# Patient Record
Sex: Female | Born: 1970
Health system: Southern US, Community
[De-identification: ages and names within clinical notes are randomized; demographics above are authoritative.]

## PROBLEM LIST (undated history)

## (undated) DIAGNOSIS — I639 Cerebral infarction, unspecified: Secondary | ICD-10-CM

## (undated) DIAGNOSIS — D573 Sickle-cell trait: Secondary | ICD-10-CM

## (undated) DIAGNOSIS — I10 Essential (primary) hypertension: Secondary | ICD-10-CM

## (undated) DIAGNOSIS — T7840XA Allergy, unspecified, initial encounter: Secondary | ICD-10-CM

## (undated) HISTORY — DX: Allergy, unspecified, initial encounter: T78.40XA

## (undated) HISTORY — DX: Sickle-cell trait: D57.3

## (undated) HISTORY — PX: TUBAL LIGATION: SHX77

---

## 2014-04-14 ENCOUNTER — Emergency Department (HOSPITAL_COMMUNITY)
Admission: EM | Admit: 2014-04-14 | Discharge: 2014-04-14 | Disposition: A | Discharge status: Home / Self Care | Payer: Self-pay | Attending: Emergency Medicine | Admitting: Emergency Medicine

## 2014-04-14 ENCOUNTER — Encounter (HOSPITAL_COMMUNITY): Payer: Self-pay | Admitting: Emergency Medicine

## 2014-04-14 DIAGNOSIS — Z7982 Long term (current) use of aspirin: Secondary | ICD-10-CM | POA: Insufficient documentation

## 2014-04-14 DIAGNOSIS — M5489 Other dorsalgia: Secondary | ICD-10-CM

## 2014-04-14 DIAGNOSIS — I10 Essential (primary) hypertension: Secondary | ICD-10-CM | POA: Insufficient documentation

## 2014-04-14 DIAGNOSIS — M549 Dorsalgia, unspecified: Secondary | ICD-10-CM | POA: Insufficient documentation

## 2014-04-14 DIAGNOSIS — Z720 Tobacco use: Secondary | ICD-10-CM | POA: Insufficient documentation

## 2014-04-14 DIAGNOSIS — Z79899 Other long term (current) drug therapy: Secondary | ICD-10-CM | POA: Insufficient documentation

## 2014-04-14 HISTORY — DX: Essential (primary) hypertension: I10

## 2014-04-14 LAB — URINALYSIS, ROUTINE W REFLEX MICROSCOPIC
Bilirubin Urine: NEGATIVE
Glucose, UA: NEGATIVE mg/dL
Hgb urine dipstick: NEGATIVE
Ketones, ur: NEGATIVE mg/dL
Leukocytes, UA: NEGATIVE
NITRITE: NEGATIVE
PH: 6.5 (ref 5.0–8.0)
Protein, ur: NEGATIVE mg/dL
SPECIFIC GRAVITY, URINE: 1.01 (ref 1.005–1.030)
Urobilinogen, UA: 0.2 mg/dL (ref 0.0–1.0)

## 2014-04-14 MED ORDER — METHOCARBAMOL 500 MG PO TABS
750.0000 mg | ORAL_TABLET | Freq: Once | ORAL | Status: AC
  Administered 2014-04-14: 750 mg via ORAL
  Filled 2014-04-14: qty 2

## 2014-04-14 MED ORDER — HYDROCODONE-ACETAMINOPHEN 5-325 MG PO TABS
2.0000 | ORAL_TABLET | Freq: Once | ORAL | Status: AC
  Administered 2014-04-14: 2 via ORAL
  Filled 2014-04-14: qty 2

## 2014-04-14 MED ORDER — METHOCARBAMOL 1000 MG/10ML IJ SOLN: 1000.0000 mg | Freq: Once | INTRAMUSCULAR | Status: DC

## 2014-04-14 MED ORDER — METHOCARBAMOL 500 MG PO TABS: 500.0000 mg | ORAL_TABLET | Freq: Two times a day (BID) | ORAL | Status: DC
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
## 2014-04-14 NOTE — ED Provider Notes (Signed)
Medical screening examination/treatment/procedure(s) were performed by non-physician practitioner and as supervising physician I was immediately available for consultation/collaboration.   EKG Interpretation None        Orpah Greek, MD 04/14/14 [phone]
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
## 2014-04-14 NOTE — Discharge Instructions (Signed)
Return to the emergency room with worsening of symptoms, new symptoms or with symptoms that are concerning , especially fevers, loss of control of bladder or bowels, numbness or tingling around genital region or anus, weakness. RICE: Rest, Ice (three cycles of 20 mins on, 28mins off at least twice a day), compression/brace, elevation. Heating pad works well for back pain. Ibuprofen 400mg  (2 tablets 200mg ) every 5-6 hours for 3-5 days and then as needed for pain. Robaxin for severe pain. Do not operate machinery, drive or drink alcohol while taking narcotics or muscle relaxers. Follow up with PCP if symptoms worsen or are persistent. Since you are new to the area, follow information below to establish care. Follow up with PCP for your HTN. It was elevated today.   Please call your doctor for a followup appointment within 24-48 hours. When you talk to your doctor please let them know that you were seen in the emergency department and have them acquire all of your records so that they can discuss the findings with you and formulate a treatment plan to fully care for your new and ongoing problems. If you do not have a primary care provider please call the number below under ED resources to establish care with a provider and follow up.    Emergency Department Resource Guide 1) Find a Doctor and Pay Out of Pocket Although you won't have to find out who is covered by your insurance plan, it is a good idea to ask around and get recommendations. You will then need to call the office and see if the doctor you have chosen will accept you as a new patient and what types of options they offer for patients who are self-pay. Some doctors offer discounts or will set up payment plans for their patients who do not have insurance, but you will need to ask so you aren't surprised when you get to your appointment.  2) Contact Your Local Health Department Not all health departments have doctors that can see patients for  sick visits, but many do, so it is worth a call to see if yours does. If you don't know where your local health department is, you can check in your phone book. The CDC also has a tool to help you locate your state's health department, and many state websites also have listings of all of their local health departments.  3) Find a [facility] If your illness is not likely to be very severe or complicated, you may want to try a walk in clinic. These are popping up all over the country in pharmacies, drugstores, and shopping centers. They're usually staffed by nurse practitioners or physician assistants that have been trained to treat common illnesses and complaints. They're usually fairly quick and inexpensive. However, if you have serious medical issues or chronic medical problems, these are probably not your best option.  No Primary Care Doctor: - Call Health Connect at  [phone] - they can help you locate a primary care doctor that  accepts your insurance, provides certain services, etc. - Physician Referral Service- [phone]  Chronic Pain Problems: Organization         Address  Phone   Notes  [facility]  [phone] Patients need to be referred by their primary care doctor.   Medication Assistance: Organization         Address  Phone   Notes  [facility] Medication [facility] [place] [place], [place] [address] [phone] --Must be a resident of [facility] -- Must have NO insurance coverage whatsoever (no Medicaid/ Medicare, etc.) -- The pt. MUST have a primary care doctor that directs their care regularly and follows them in the community   MedAssist  [phone]   [employer]  [phone]    Agencies that provide inexpensive medical care: Organization         Address  Phone   Notes  [place]  [phone]   [facility]    [phone]   [facility]  [place], [place] [address] [phone]   [place] [address], [place] [phone]   Planned Parenthood    [phone]   [facility]    [phone]   [place] [address], [place] Phone:  [phone], Fax:  [phone] Hours of Operation:  9 am - 6 pm, M-F.  Also accepts Medicaid/Medicare and self-pay.  [facility] for [place], [address], [place] Phone: [phone], Fax: [phone]. Hours of Operation:  8:30 am - 5:30 pm, M-F.  Also accepts Medicaid and self-pay.  [facility] [place] [address] [place] Phone: [phone]   [place], [place], [place] [phone], Ext. [phone] Mondays & Thursdays: 7-9 AM.  First 15 patients are seen on a first come, first serve basis.    [place] Providers:  Organization         Address  Phone   Notes  [facility] [address], [place] [phone] Also accepts self-pay patients.  [facility] [address], [place]  [phone]   [place], [address], [place] [phone]   [facility] [address], [place] [phone]   Lucianne Lei [address], [place]   [phone] Only accepts [place] Access [place] patients after they have their name applied to their card.   Self-Pay (no insurance) in [facility]:  Organization         Address  Phone   Notes  Sickle Cell Patients, [facility] [place] [phone]   [facility] [place] [phone]   [facility] [place], [place], [place] [phone]   [facility]/Dr. Osei-Bonsu  [address] [place] or [address], [place] [phone] Phone number for both [place] and [place] locations is the same.  [facility] and [facility] [address], [place] [phone]   [facility] [address], [place] or [address] [phone]   [facility] [address] [place] [phone], phone; [phone], fax Sees patients 1st and 3rd Saturday of every month.  Must not qualify for public or private insurance (i.e. Medicaid, Medicare, [place] Health Choice, Veterans' Benefits)  Household income should be no more than 200% of the poverty level The clinic cannot treat you if you are pregnant or think you are pregnant  Sexually transmitted diseases are not treated at the clinic.    Dental Care: Organization  Address  Phone  Notes  [facility] [place], [place] [phone] Accepts children up to age 12 who are enrolled in [place] or [place]; pregnant women with a Medicaid card; and children who have applied for Medicaid or [place] Health Choice, but were declined, whose parents can pay a reduced fee at time of service.  [facility] Department of [facility]  [address], [place] [phone] Accepts children up to age 64 who are enrolled in [place] or [place]; pregnant women with a Medicaid card; and children who have applied for Medicaid or [place] Health Choice, but were declined, whose parents can pay a reduced fee at time of service.  [place] Adult Dental Access PROGRAM  [place] [phone] Patients are seen by appointment only. Walk-ins are not accepted. [place] will see patients 74 years of age and older. Monday - Tuesday (8am-5pm) Most Wednesdays (8:30-5pm) $30 per visit, cash only  [facility] Adult Dental Access PROGRAM  [address], [facility] [phone] Patients are seen by appointment only. Walk-ins are not accepted. [place] will see patients 76 years of age and older. One Wednesday Evening (Monthly: Volunteer Based).  $30 per visit, cash only  [place]  [phone] for adults; Children under age 21, call Graduate Pediatric Dentistry at [phone]. Children aged 70-14, please call [phone] to request a pediatric application.  Dental services are provided in all areas of dental care including fillings, crowns and bridges, complete and partial dentures, implants, gum treatment, root canals, and extractions. Preventive care is also provided. Treatment is provided to both adults and children. Patients are selected via a lottery and there is often a waiting list.   [facility] [address], [place]  [phone] [email or web address]   [facility] [address] [place], [place] [phone], Ext. [phone] Second and Fourth Thursday of each month, opens at 6:30 AM; Clinic ends at 9 AM.  Patients are seen on a first-come first-served basis, and a limited number are seen during each clinic.   [facility]  [address] [place], [place] [phone]   Eligibility Requirements You must have lived in [place], [place], or [place] counties for at least the last three months.   You cannot be eligible for state or federal sponsored [employer], including [employer], [place], or [employer].   You generally cannot be eligible for healthcare insurance through your employer.    How to apply: Eligibility screenings are held every Tuesday and Wednesday afternoon from 1:00 pm until 4:00 pm. You do not need an appointment for the interview!  [facility] [address], [place], [place]   [place]  [place] Department  [place]  [phone]    Behavioral Health Resources in the Community: Intensive Outpatient Programs Organization         Address  Phone  Notes  [place]. [address], [place], [place] [phone]   [facility] [address], [place], [place]   ADS: [facility] Drug [facility] [address] [place], [place]   [place] [address]  [place], [place] or [phone]   Substance Abuse  Resources Organization         Address  Phone  Notes  Alcohol and Drug Services  [place]  ([phone]   [facility] [place]  [phone]   Chinita Pester  [phone]   Residential & Outpatient Substance Abuse Program  9560572712   Psychological Services Organization         Address  Phone  Notes  [facility] [place]  [place]  [phone]   [place] [address] [place] or [phone]    Mobile Crisis Teams Organization         Address  Phone  Notes  [facility], Mobile Crisis Care Unit  [phone]   Assertive Psychotherapeutic Services  [address] [place], [place]   [place] Levels [address], [place] [phone]    Self-Help/Support Groups Organization         Address  Phone             Notes  [place]. of [place] - variety of support groups  [place] Call for more information  Narcotics Anonymous (NA), [facility] [address], [employer] [place]  2 meetings at this location   Special educational needs teacher         Address  Phone  Notes  [facility]idential [facility] [place],    [place]  [phone]   [facility]  [address], [place] [phone], [place], [place]   [place], [place]  [phone] Admissions: 8am-3pm M-F  Incentives Substance [place] [address],    [place], [place] [phone]   [facility] [address] [place], [place], [place]   [facility] [address]  [place], [place]   [facility] Programs - Intensive Outpatient [address], [place], [place]   [facility] ([place] [place].,  [place], [place] [phone] or [phone]   [facility] (RTS) [address], [place], [place] Accepts Medicaid  [facility] [place] [address]  [place] [phone] Substance Abuse/Addiction Treatment   [facility] Organization         Address  Phone  Notes  [facility]  [phone]   Domenic Schwab, PhD [address] [place]lis [place], [place]   [phone] or [phone]   [facility]ita [place], [place] [phone]   [facility] [address] [place], [place] [phone] Insurance/Medicaid/sponsorship through [facility] and Families [address], [address]                                    [place], [place] [phone] [place] [address], [place] [phone]    Dr. Adele Schilder  [phone]   [facility] Dept. 1) [address] [place] 2) [place] 3)  [address], [place] [phone]  [phone]   [place] [phone] or [phone] (After Hours)

## 2015-05-16 ENCOUNTER — Emergency Department (HOSPITAL_COMMUNITY): Payer: Self-pay

## 2015-05-16 ENCOUNTER — Encounter (HOSPITAL_COMMUNITY): Payer: Self-pay

## 2015-05-16 ENCOUNTER — Emergency Department (HOSPITAL_COMMUNITY)
Admission: EM | Admit: 2015-05-16 | Discharge: 2015-05-16 | Disposition: A | Discharge status: Home / Self Care | Payer: Self-pay | Attending: Emergency Medicine | Admitting: Emergency Medicine

## 2015-05-16 DIAGNOSIS — Z7982 Long term (current) use of aspirin: Secondary | ICD-10-CM | POA: Insufficient documentation

## 2015-05-16 DIAGNOSIS — R05 Cough: Secondary | ICD-10-CM | POA: Insufficient documentation

## 2015-05-16 DIAGNOSIS — R51 Headache: Secondary | ICD-10-CM | POA: Insufficient documentation

## 2015-05-16 DIAGNOSIS — Z79899 Other long term (current) drug therapy: Secondary | ICD-10-CM | POA: Insufficient documentation

## 2015-05-16 DIAGNOSIS — F17210 Nicotine dependence, cigarettes, uncomplicated: Secondary | ICD-10-CM | POA: Insufficient documentation

## 2015-05-16 DIAGNOSIS — R69 Illness, unspecified: Secondary | ICD-10-CM

## 2015-05-16 DIAGNOSIS — J029 Acute pharyngitis, unspecified: Secondary | ICD-10-CM | POA: Insufficient documentation

## 2015-05-16 DIAGNOSIS — M791 Myalgia: Secondary | ICD-10-CM | POA: Insufficient documentation

## 2015-05-16 DIAGNOSIS — I10 Essential (primary) hypertension: Secondary | ICD-10-CM | POA: Insufficient documentation

## 2015-05-16 DIAGNOSIS — J111 Influenza due to unidentified influenza virus with other respiratory manifestations: Secondary | ICD-10-CM

## 2015-05-16 NOTE — ED Provider Notes (Signed)
CSN: [id number]     Arrival date & time 05/16/15  1403 History   First MD Initiated Contact with Patient 05/16/15 1541     Chief Complaint  Patient presents with  . Generalized Body Aches  . Sore Throat  . Fever   Chief complaint "I have the flu"  (Consider location/radiation/quality/duration/timing/severity/associated sxs/prior Treatment) HPI Patient complains of diffuse myalgias, cough sore throat headache onset 2 days ago she denies fever denies nausea or vomiting he denies abdominal pain. She's treated herself with ginger with partial relief. She denies shortness of breath. No other associated symptoms. Reports several coworkers have similar symptoms. Nothing makes symptoms better or worse Past Medical History  Diagnosis Date  . Hypertension    History reviewed. No pertinent past surgical history. History reviewed. No pertinent family history. Social History  Substance Use Topics  . Smoking status: Current Every Day Smoker    Types: Cigarettes  . Smokeless tobacco: None  . Alcohol Use: No   smokes 2 cigarettes per day. Attempting to quit, no drug use OB History    No data available     Review of Systems  Constitutional: Negative.   HENT: Positive for sore throat.   Respiratory: Positive for cough.   Cardiovascular: Negative.   Gastrointestinal: Negative.   Musculoskeletal: Positive for myalgias.  Skin: Negative.   Neurological: Positive for headaches.  Psychiatric/Behavioral: Negative.   All other systems reviewed and are negative.     Allergies  Review of patient's allergies indicates no known allergies.  Home Medications   Prior to Admission medications   Medication Sig Start Date End Date Taking? Authorizing Provider  aspirin 81 MG chewable tablet Chew 81 mg by mouth daily.    Historical Provider, MD  ibuprofen (ADVIL,MOTRIN) 200 MG tablet Take 400-1,200 mg by mouth every 6 (six) hours as needed for moderate pain or cramping.    Historical Provider, MD   methocarbamol (ROBAXIN) 500 MG tablet Take 1 tablet (500 mg total) by mouth 2 (two) times daily. 04/14/14   Al Corpus, PA-C   BP 121/87 mmHg  Pulse 94  Temp(Src) 98.2 F (36.8 C) (Oral)  Resp 16  SpO2 100%  LMP 04/19/2015 Physical Exam  Constitutional: She appears well-developed and well-nourished.  HENT:  Head: Normocephalic and atraumatic.  Right Ear: External ear normal.  Left Ear: External ear normal.  Bilateral tympanic membranes normal oropharynx minimally reddened uvula midline  Eyes: Conjunctivae are normal. Pupils are equal, round, and reactive to light.  Neck: Neck supple. No tracheal deviation present. No thyromegaly present.  Cardiovascular: Normal rate and regular rhythm.   No murmur heard. Pulmonary/Chest: Effort normal and breath sounds normal.  Abdominal: Soft. Bowel sounds are normal. She exhibits no distension. There is no tenderness.  Musculoskeletal: Normal range of motion. She exhibits no edema or tenderness.  Neurological: She is alert. Coordination normal.  Skin: Skin is warm and dry. No rash noted.  Psychiatric: She has a normal mood and affect.  Nursing note and vitals reviewed.   ED Course  Procedures (including critical care time) Labs Review Labs Reviewed - No data to display  Imaging Review Dg Chest 2 View  05/16/2015  CLINICAL DATA:  Body aches, sore throat, chest pain, cough, and fever for 2 days. EXAM: CHEST  2 VIEW COMPARISON:  None. FINDINGS: Linear opacities in the left lower lobe favoring subsegmental atelectasis, less likely to be early bronchopneumonia. The lungs appear otherwise clear. Cardiac and mediastinal margins appear normal. IMPRESSION: 1. Faint linear opacities  in the left lower lobe, favoring subsegmental atelectasis, less likely to be early bronchopneumonia. Electronically Signed   By: Van Clines M.D.   On: 05/16/2015 14:58   I have personally reviewed and evaluated these images and lab results as part of my  medical decision-making.   EKG Interpretation   Date/Time:  Tuesday May 16 2015 14:37:17 EST Ventricular Rate:  97 PR Interval:  179 QRS Duration: 92 QT Interval:  362 QTC Calculation: 460 R Axis:   40 Text Interpretation:  Sinus rhythm No old tracing to compare Confirmed by  Winfred Leeds  MD, Michall Noffke [phone]) on 05/16/2015 3:43:36 PM     Chest x-ray viewed by me MDM  Doubt pneumonia. Patient with excellent-like illness. No fever. Normal pulse oximetry, and clear lung exam. Normal respiratory rate. I councilled pt for 5 minutes on smoking cerssation Plan Tylenol. Encourage oral hydration. Referral to [facility], [facility], [facility] wellness primary care physician. Diagnosis influenza-like illness Final diagnoses:  None        Orlie Dakin, MD 05/16/15 [phone]

## 2015-05-16 NOTE — Discharge Instructions (Signed)
Influenza, Adult Take Tylenol as directed for aches. Make sure that you continue to work on quitting smoking. See an urgent care center if you're not feeling back to normal 3 days. Call the [facility] to get a primary care physician. Return if you feel worse for any reason. Influenza (flu) is an infection in the mouth, nose, and throat (respiratory tract) caused by a virus. The flu can make you feel very ill. Influenza spreads easily from person to person (contagious).  HOME CARE   Only take medicines as told by your doctor.  Use a cool mist humidifier to make breathing easier.  Get plenty of rest until your fever goes away. This usually takes 3 to 4 days.  Drink enough fluids to keep your pee (urine) clear or pale yellow.  Cover your mouth and nose when you cough or sneeze.  Wash your hands well to avoid spreading the flu.  Stay home from work or school until your fever has been gone for at least 1 full day.  Get a flu shot every year. GET HELP RIGHT AWAY IF:   You have trouble breathing or feel short of breath.  Your skin or nails turn blue.  You have severe neck pain or stiffness.  You have a severe headache, facial pain, or earache.  Your fever gets worse or keeps coming back.  You feel sick to your stomach (nauseous), throw up (vomit), or have watery poop (diarrhea).  You have chest pain.  You have a deep cough that gets worse, or you cough up more thick spit (mucus). MAKE SURE YOU:   Understand these instructions.  Will watch your condition.  Will get help right away if you are not doing well or get worse.   This information is not intended to replace advice given to you by your health care provider. Make sure you discuss any questions you have with your health care provider.   Document Released: 03/26/2008 Document Revised: 07/08/2014 Document Reviewed: 09/16/2011 Elsevier Interactive Patient Education [employer].

## 2015-05-16 NOTE — ED Notes (Signed)
Pt c/o generalized body aches, sore throat, chest pain w/ coughing, and fever x 2 days.  Pain score 9/10.  Pt has not taken anything for symptoms.  Sts her coworkers have been sick.

## 2015-05-16 NOTE — ED Notes (Signed)
Patient was alert, oriented and stable upon discharge. RN went over AVS and patient had no further questions.  

## 2015-10-23 ENCOUNTER — Ambulatory Visit (INDEPENDENT_AMBULATORY_CARE_PROVIDER_SITE_OTHER): Payer: Self-pay | Admitting: Internal Medicine

## 2015-10-23 ENCOUNTER — Encounter: Payer: Self-pay | Admitting: Internal Medicine

## 2015-10-23 VITALS — BP 114/80 | HR 89 | Temp 98.3°F | Ht 60.0 in | Wt 197.6 lb

## 2015-10-23 DIAGNOSIS — Z0000 Encounter for general adult medical examination without abnormal findings: Secondary | ICD-10-CM | POA: Insufficient documentation

## 2015-10-23 DIAGNOSIS — I10 Essential (primary) hypertension: Secondary | ICD-10-CM

## 2015-10-23 DIAGNOSIS — J309 Allergic rhinitis, unspecified: Secondary | ICD-10-CM

## 2015-10-23 DIAGNOSIS — Z720 Tobacco use: Secondary | ICD-10-CM | POA: Insufficient documentation

## 2015-10-23 DIAGNOSIS — Z23 Encounter for immunization: Secondary | ICD-10-CM | POA: Insufficient documentation

## 2015-10-23 DIAGNOSIS — F17210 Nicotine dependence, cigarettes, uncomplicated: Secondary | ICD-10-CM

## 2015-10-23 DIAGNOSIS — M62830 Muscle spasm of back: Secondary | ICD-10-CM | POA: Insufficient documentation

## 2015-10-23 MED ORDER — ATENOLOL 50 MG PO TABS: 50.0000 mg | ORAL_TABLET | Freq: Every day | ORAL | Status: DC

## 2015-10-23 MED ORDER — CYCLOBENZAPRINE HCL 10 MG PO TABS
10.0000 mg | ORAL_TABLET | Freq: Every evening | ORAL | Status: DC | PRN
Start: 2015-10-23 — End: 2016-01-22

## 2015-10-23 MED ORDER — SALINE SPRAY 0.65 % NA SOLN: 1.0000 | NASAL | Status: DC | PRN

## 2015-10-23 NOTE — Assessment & Plan Note (Addendum)
Pt reports last pap smear was about 3.5 yrs ago.  Is unsure if she also had HPV completed. -will need pap smear  -check labs today

## 2015-10-23 NOTE — Assessment & Plan Note (Addendum)
BP 114/80.   -refilled atenolol  -check CMP, lipid panel

## 2015-10-23 NOTE — Assessment & Plan Note (Signed)
Pt reports sneezing, rhinorrhea, itchy eyes, nasal congestion.  No fever/chills.  She tried steroid spray and "dug a hole in her nose."  She takes zyrtec daily which helps somewhat.  Benadryl makes her sleepy.  She has not tried nasal saline spray. -will try nasal saline spray -cont zyrtec

## 2015-10-23 NOTE — Patient Instructions (Signed)
Thank you for your visit today.   Please return to the internal medicine clinic in about 3 months for a pap smear. I have refilled your medications and sent them to your pharmacy (atenolol and flexeril).    We will check some basic labs today.  You need a pap smear.     Please be sure to bring all of your medications with you to every visit; this includes herbal supplements, vitamins, eye drops, and any over-the-counter medications.   Should you have any questions regarding your medications and/or any new or worsening symptoms, please be sure to call the clinic at [phone].   If you believe that you are suffering from a life threatening condition or one that may result in the loss of limb or function, then you should call 911 and proceed to the nearest Emergency Department.   A healthy lifestyle and preventative care can promote health and wellness.   Maintain regular health, dental, and eye exams.  Eat a healthy diet. Foods like vegetables, fruits, whole grains, low-fat dairy products, and lean protein foods contain the nutrients you need without too many calories. Decrease your intake of foods high in solid fats, added sugars, and salt. Get information about a proper diet from your caregiver, if necessary.  Regular physical exercise is one of the most important things you can do for your health. Most adults should get at least 150 minutes of moderate-intensity exercise (any activity that increases your heart rate and causes you to sweat) each week. In addition, most adults need muscle-strengthening exercises on 2 or more days a week.   Maintain a healthy weight. The body mass index (BMI) is a screening tool to identify possible weight problems. It provides an estimate of body fat based on height and weight. Your caregiver can help determine your BMI, and can help you achieve or maintain a healthy weight. For adults 20 years and older:  A BMI below 18.5 is considered  underweight.  A BMI of 18.5 to 24.9 is normal.  A BMI of 25 to 29.9 is considered overweight.  A BMI of 30 and above is considered obese.

## 2015-10-23 NOTE — Assessment & Plan Note (Addendum)
Pt has h/o lower back spasms since 2010.  She takes ibuprofen PRN and also flexeril PRN.  Denies any weakness or paresthesias in the LE.   -cont to monitor  -refilled flexeril 10mg  PRN hs

## 2015-10-23 NOTE — Progress Notes (Signed)
Patient ID: Amber Franco, female   DOB: 04-16-1971, 45 y.o.MRN: [id number]     Subjective:   Patient ID: Amber Franco female    DOB: 1970/08/22 45 y.o.    MRN: [id number] Health Maintenance Due: Health Maintenance Due  Topic Date Due  . HIV Screening  02/11/1986  . TETANUS/TDAP  02/11/1990  . PAP SMEAR  02/12/1992    _________________________________________________  HPI: Amber Franco is a 45 y.o. female here for to establish care.  Pt has a PMH outlined below.  Please see problem-based charting assessment and plan for further status of patient's chronic medical problems addressed at today's visit.  PMH: Past Medical History  Diagnosis Date  . Hypertension   . Allergy   . Sickle cell trait ([place])     Medications: Current Outpatient Prescriptions on File Prior to Visit  Medication Sig Dispense Refill  . aspirin 81 MG chewable tablet Chew 81 mg by mouth daily.    Marland Kitchen ibuprofen (ADVIL,MOTRIN) 200 MG tablet Take 400-1,200 mg by mouth every 6 (six) hours as needed for moderate pain or cramping.     No current facility-administered medications on file prior to visit.    Allergies: Allergies  Allergen Reactions  . Hydrocodone Anaphylaxis    FH: Family History  Problem Relation Age of Onset  . Cancer Mother   . Hypertension Father   . Stroke Father     SH: Social History   Social History  . Marital Status: Single    Spouse Name: N/A  . Number of Children: N/A  . Years of Education: N/A   Social History Main Topics  . Smoking status: Current Every Day Smoker    Types: Cigarettes  . Smokeless tobacco: None  . Alcohol Use: No  . Drug Use: No  . Sexual Activity: Not Asked   Other Topics Concern  . None   Social History Narrative    Review of Systems: Constitutional: Negative for fever, chills.  Respiratory: Negative for cough and shortness of breath.  Cardiovascular: Negative for chest pain.  Gastrointestinal: Negative for nausea, vomiting. Neurological:  Negative for dizziness.   Objective:   Vital Signs: Filed Vitals:   10/23/15 1440  BP: 114/80  Pulse: 89  Temp: 98.3 F (36.8 C)  TempSrc: Oral  Height: 5' (1.524 m)  Weight: 197 lb 9.6 oz (89.631 kg)  SpO2: 100%      BP Readings from Last 3 Encounters:  10/23/15 114/80  05/16/15 121/87  04/14/14 154/104    Physical Exam: Constitutional: Vital signs reviewed.  Patient is in NAD and cooperative with exam.  Head: Normocephalic and atraumatic. Eyes: EOMI, conjunctivae nl, no scleral icterus.  Neck: Supple. Cardiovascular: RRR, no MRG. Pulmonary/Chest: normal effort, CTAB, no wheezes, rales, or rhonchi. Abdominal: Soft. NT/ND +BS. Neurological: A&O x3, cranial nerves II-XII are grossly intact, moving all extremities. Extremities:  Skin: Warm, dry and intact. No rash.   Assessment & Plan:   Assessment and plan was discussed and formulated with my attending.

## 2015-10-23 NOTE — Assessment & Plan Note (Signed)
Pt smokes 3 cig/day for the past 20+ yrs.  She has tried the [place]. but states they don't work.  -given info on the quitline

## 2015-10-24 LAB — CMP14 + ANION GAP
ALBUMIN: 4.2 g/dL (ref 3.5–5.5)
ALK PHOS: 58 IU/L (ref 39–117)
ALT: 21 IU/L (ref 0–32)
ANION GAP: 18 mmol/L (ref 10.0–18.0)
AST: 13 IU/L (ref 0–40)
Albumin/Globulin Ratio: 1.6 (ref 1.2–2.2)
BUN / CREAT RATIO: 13 (ref 9–23)
BUN: 9 mg/dL (ref 6–24)
Bilirubin Total: <0.2 mg/dL (ref 0.0–1.2)
CALCIUM: 9.6 mg/dL (ref 8.7–10.2)
CO2: 19 mmol/L (ref 18–29)
Chloride: 103 mmol/L (ref 96–106)
Creatinine, Ser: 0.68 mg/dL (ref 0.57–1.00)
GFR calc Af Amer: 123 mL/min/{1.73_m2} (ref >59)
GFR calc non Af Amer: 107 mL/min/{1.73_m2} (ref >59)
Globulin, Total: 2.7 g/dL (ref 1.5–4.5)
Glucose: 101 mg/dL — ABNORMAL HIGH (ref 65–99)
Potassium: 4.2 mmol/L (ref 3.5–5.2)
Sodium: 140 mmol/L (ref 134–144)
Total Protein: 6.9 g/dL (ref 6.0–8.5)

## 2015-10-24 LAB — CBC WITH DIFFERENTIAL/PLATELET
BASOS ABS: 0 10*3/uL (ref 0.0–0.2)
BASOS: 0 %
EOS (ABSOLUTE): 0.4 10*3/uL (ref 0.0–0.4)
Eos: 3 %
Hematocrit: 41.5 % (ref 34.0–46.6)
Hemoglobin: 14.2 g/dL (ref 11.1–15.9)
IMMATURE GRANULOCYTES: 0 %
Immature Grans (Abs): 0 10*3/uL (ref 0.0–0.1)
LYMPHS ABS: 3.8 10*3/uL — AB (ref 0.7–3.1)
Lymphs: 31 %
MCH: 29.6 pg (ref 26.6–33.0)
MCHC: 34.2 g/dL (ref 31.5–35.7)
MCV: 87 fL (ref 79–97)
MONOS ABS: 0.7 10*3/uL (ref 0.1–0.9)
Monocytes: 6 %
NEUTROS ABS: 7.2 10*3/uL — AB (ref 1.4–7.0)
Neutrophils: 60 %
PLATELETS: 344 10*3/uL (ref 150–379)
RBC: 4.79 x10E6/uL (ref 3.77–5.28)
RDW: 13 % (ref 12.3–15.4)
WBC: 12 10*3/uL — ABNORMAL HIGH (ref 3.4–10.8)

## 2015-10-24 LAB — LIPID PANEL
CHOLESTEROL TOTAL: 197 mg/dL (ref 100–199)
Chol/HDL Ratio: 4.9 ratio units — ABNORMAL HIGH (ref 0.0–4.4)
HDL: 40 mg/dL (ref >39)
LDL Calculated: 114 mg/dL — ABNORMAL HIGH (ref 0–99)
Triglycerides: 214 mg/dL — ABNORMAL HIGH (ref 0–149)
VLDL Cholesterol Cal: 43 mg/dL — ABNORMAL HIGH (ref 5–40)

## 2015-10-25 NOTE — Progress Notes (Signed)
Case discussed with Dr. Gill soon after the resident saw the patient.  We reviewed the resident's history and exam and pertinent patient test results.  I agree with the assessment, diagnosis, and plan of care documented in the resident's note. 

## 2015-10-25 NOTE — Addendum Note (Signed)
Addended by: Oval Linsey D on: 10/25/2015 01:26 PM   Modules accepted: Level of Service

## 2015-11-06 ENCOUNTER — Ambulatory Visit: Payer: Self-pay | Admitting: Internal Medicine

## 2016-01-22 ENCOUNTER — Ambulatory Visit (INDEPENDENT_AMBULATORY_CARE_PROVIDER_SITE_OTHER): Payer: Self-pay | Admitting: Internal Medicine

## 2016-01-22 DIAGNOSIS — Z720 Tobacco use: Secondary | ICD-10-CM

## 2016-01-22 DIAGNOSIS — F17210 Nicotine dependence, cigarettes, uncomplicated: Secondary | ICD-10-CM

## 2016-01-22 DIAGNOSIS — M62830 Muscle spasm of back: Secondary | ICD-10-CM

## 2016-01-22 DIAGNOSIS — F411 Generalized anxiety disorder: Secondary | ICD-10-CM

## 2016-01-22 DIAGNOSIS — Z0000 Encounter for general adult medical examination without abnormal findings: Secondary | ICD-10-CM

## 2016-01-22 MED ORDER — CYCLOBENZAPRINE HCL 10 MG PO TABS: 10.0000 mg | ORAL_TABLET | Freq: Every evening | ORAL | 0 refills | Status: DC | PRN

## 2016-01-22 MED ORDER — NAPROXEN 500 MG PO TABS: 500.0000 mg | ORAL_TABLET | Freq: Two times a day (BID) | ORAL | 2 refills | Status: DC

## 2016-01-22 MED ORDER — CITALOPRAM HYDROBROMIDE 10 MG PO TABS: 10.0000 mg | ORAL_TABLET | Freq: Every day | ORAL | 2 refills | Status: DC

## 2016-01-22 MED ORDER — ATENOLOL 50 MG PO TABS: 50.0000 mg | ORAL_TABLET | Freq: Every day | ORAL | 3 refills | Status: DC

## 2016-01-22 NOTE — Assessment & Plan Note (Signed)
Actively trying to quit. Patient just recently purchased nicotine patches. Will follow up at next visit.

## 2016-01-22 NOTE — Patient Instructions (Addendum)
Please start taking Citalopram very night before bed. For your back spasm, alternate taking flexeril and Naproxen. Please take naproxen with meals.

## 2016-01-22 NOTE — Assessment & Plan Note (Signed)
Discussed cancer screening recommendations today. Patient very anxious given multiple of her family members have pass away from cancer, mostly GI related. Counseled on the importance of smoking cessation. Will discuss referring for mammogram at her next visit given positive family history. Pap smear today.

## 2016-01-22 NOTE — Assessment & Plan Note (Signed)
Continues to experience chronic back spasms with pain that radiates to her arm. Has been taking flexeril but says it recently stopped working. Discussed alternating flexeril with naproxen. Patient agreeable to try this plan.

## 2016-01-22 NOTE — Assessment & Plan Note (Signed)
Patient endorses being under a lot of stress after the death of her husband, mother and father (all in the last year). Very tearful today and says she has not been sleeping. Wakes up and worries about what she needs to do and taking care of her brothers. Discussed benefits and risks of starting an SSRI today. Will do a trial of citalopram.

## 2016-01-22 NOTE — Progress Notes (Signed)
   CC: Back spasms, anxiety  HPI:   [name] is a 45 y.o. female with PMHx detailed below presenting with anxiety and stress after the death of her husband, mother, and father over the past year. Patient has been overwhelmed taking care of her family and has not been sleeping. She describes waking up multiple times throughout the night worrying about what she needs to do. She was very tearful today on exam. She also endorses chronic back spasms with pain that radiates to her L arm.   See problem based assessment and plan below for additional details.  Past Medical History:  Diagnosis Date  . Allergy   . Hypertension   . Sickle cell trait ([place])     Review of Systems: Review of Systems  Constitutional: Negative for chills, fever and weight loss.  Eyes: Negative for blurred vision.  Respiratory: Negative for cough and shortness of breath.   Cardiovascular: Negative for chest pain and palpitations.  Gastrointestinal: Negative for abdominal pain, constipation, diarrhea, heartburn, nausea and vomiting.  Genitourinary: Negative for dysuria.  Musculoskeletal: Positive for back pain.  Neurological: Negative for dizziness and headaches.  Psychiatric/Behavioral: Negative for depression and suicidal ideas. The patient is nervous/anxious and has insomnia.      Physical Exam: There were no vitals filed for this visit. GENERAL- alert, co-operative, NAD HEENT- Atraumatic, PERRL, EOMI, oral mucosa appears moist, good and intact dentition, no carotid bruit, no cervical LN enlargement. CARDIAC- RRR, no murmurs, rubs or gallops. RESP- CTAB, no wheezes or crackles. ABDOMEN- Soft, nontender, no guarding or rebound, normoactive bowel sounds present BACK- Normal curvature, no paraspinal tenderness, no CVA tenderness. NEURO- No obvious Cr N abnormality, strength upper and lower extremities- 5/5, Sensation intact globally EXTREMITIES- pulse 2+, symmetric, no pedal edema. SKIN- Warm, dry, No rash  or lesion. PSYCH- Normal mood and affect, appropriate thought content and speech.   There were no vitals filed for this visit.   Assessment & Plan:   See encounters tab for problem based medical decision making.    Patient seen with Dr. Angelia Mould

## 2016-01-23 LAB — CYTOLOGY - PAP

## 2016-01-23 NOTE — Progress Notes (Signed)
Internal Medicine Clinic Attending  I saw and evaluated the patient.  I personally confirmed the key portions of the history and exam documented by Dr. Guilloud and I reviewed pertinent patient test results.  The assessment, diagnosis, and plan were formulated together and I agree with the documentation in the resident's note.  

## 2016-02-05 ENCOUNTER — Ambulatory Visit: Payer: Self-pay

## 2016-02-12 ENCOUNTER — Ambulatory Visit: Payer: Self-pay

## 2016-03-18 ENCOUNTER — Other Ambulatory Visit: Payer: Self-pay | Admitting: Internal Medicine

## 2016-03-18 DIAGNOSIS — M62830 Muscle spasm of back: Secondary | ICD-10-CM

## 2016-03-18 NOTE — Telephone Encounter (Signed)
atenolol (TENORMIN) 50 MG tablet refill [facility] pharm Pt states she has been out for 3 weeks

## 2016-03-18 NOTE — Telephone Encounter (Signed)
Spoke to pt's pharmacy-pt has refill on file, but atenolol was on backorder and the pharmacy was out. Pharmacy was actually in the middle of transferring rx to [place] so pt could pick up rx today. No further action is required-pt does state she is having some joint pain, but this can be addressed at upcoming visit on 10/16.Despina Hidden Cassady9/18/201711:53 AM

## 2016-03-25 ENCOUNTER — Other Ambulatory Visit: Payer: Self-pay | Admitting: Internal Medicine

## 2016-03-25 DIAGNOSIS — M62830 Muscle spasm of back: Secondary | ICD-10-CM

## 2016-03-25 NOTE — Telephone Encounter (Signed)
Pt calls and states she needs refill, also states she is in severe pain and needs meds and a work note, appt made for [facility] 9/26

## 2016-03-26 ENCOUNTER — Ambulatory Visit: Payer: Self-pay

## 2016-04-02 NOTE — Telephone Encounter (Signed)
I apologize I must have missed your note. I will refill her flexeril for one month supply but patient needs to schedule a follow up appointment. I cannot excuse her from work until I evaluate her in the office. Thanks!

## 2016-04-02 NOTE — Telephone Encounter (Signed)
Tried to call pt, lm for rtc 

## 2016-04-04 ENCOUNTER — Telehealth: Payer: Self-pay | Admitting: *Deleted

## 2016-04-04 DIAGNOSIS — I10 Essential (primary) hypertension: Secondary | ICD-10-CM

## 2016-04-04 MED ORDER — ATENOLOL 50 MG PO TABS: 50.0000 mg | ORAL_TABLET | Freq: Every day | ORAL | 3 refills | Status: DC

## 2016-04-04 NOTE — Progress Notes (Signed)
Prescription for atenolol transferred to [place]. Patient notified.

## 2016-04-04 NOTE — Telephone Encounter (Signed)
Pt calls and states she is out of atenolol 50mg  and her pharm states they are on back order plus she is not due for a refill until next week. She states she went without it for weeks and then when she got it she doubled up for awhile and now is back on her 1 daily regimen.  She is advised never to double her meds unless told to by a physician Also she has the orange card so possibly there are much cheaper ways for her to get her meds Sending to dr Philipp Ovens and dr Maudie Mercury She needs her meds soon

## 2016-04-06 ENCOUNTER — Encounter (HOSPITAL_COMMUNITY): Payer: Self-pay | Admitting: *Deleted

## 2016-04-06 ENCOUNTER — Emergency Department (HOSPITAL_COMMUNITY)
Admission: EM | Admit: 2016-04-06 | Discharge: 2016-04-06 | Disposition: A | Discharge status: Home / Self Care | Payer: Self-pay | Attending: Emergency Medicine | Admitting: Emergency Medicine

## 2016-04-06 DIAGNOSIS — F17210 Nicotine dependence, cigarettes, uncomplicated: Secondary | ICD-10-CM | POA: Insufficient documentation

## 2016-04-06 DIAGNOSIS — Z7982 Long term (current) use of aspirin: Secondary | ICD-10-CM | POA: Insufficient documentation

## 2016-04-06 DIAGNOSIS — T7840XA Allergy, unspecified, initial encounter: Secondary | ICD-10-CM

## 2016-04-06 DIAGNOSIS — Z79899 Other long term (current) drug therapy: Secondary | ICD-10-CM | POA: Insufficient documentation

## 2016-04-06 DIAGNOSIS — I10 Essential (primary) hypertension: Secondary | ICD-10-CM | POA: Insufficient documentation

## 2016-04-06 DIAGNOSIS — L232 Allergic contact dermatitis due to cosmetics: Secondary | ICD-10-CM | POA: Insufficient documentation

## 2016-04-06 MED ORDER — HYDROXYZINE HCL 10 MG PO TABS
10.0000 mg | ORAL_TABLET | Freq: Once | ORAL | Status: AC
  Administered 2016-04-06: 10 mg via ORAL
  Filled 2016-04-06: qty 1

## 2016-04-06 MED ORDER — PREDNISONE 20 MG PO TABS: 40.0000 mg | ORAL_TABLET | Freq: Every day | ORAL | 0 refills | Status: DC

## 2016-04-06 MED ORDER — HYDROXYZINE HCL 25 MG PO TABS: 25.0000 mg | ORAL_TABLET | Freq: Four times a day (QID) | ORAL | 0 refills | Status: DC | PRN

## 2016-04-06 MED ORDER — PREDNISONE 20 MG PO TABS
40.0000 mg | ORAL_TABLET | Freq: Once | ORAL | Status: AC
  Administered 2016-04-06: 40 mg via ORAL
  Filled 2016-04-06: qty 2

## 2016-04-06 NOTE — ED Provider Notes (Signed)
[facility] DEPT Provider Note   CSN: [id number] Arrival date & time: 04/06/16  1635  By signing my name below, I, Dolores Hoose, attest that this documentation has been prepared under the direction and in the presence of non-physician practitioner, Pearlie Oyster, PA-C. Electronically Signed: Dolores Hoose, Scribe. 04/06/2016. 5:33 PM.  History   Chief Complaint Chief Complaint  Patient presents with  . Allergic Reaction   The history is provided by the patient. No language interpreter was used.   HPI Comments:  Amber Franco is a 45 y.o. female with PMHx of HTN who presents to the Emergency Department complaining of sudden onset constant left-hand and wrist irritation s/p allergic reaction 4-5 hours ago. Pt states she was at work when a coworker sprayed her with a perfume and believes she had an allergic reaction to the perfuse. It hit her left hand which immediately became red, swollen and itchy. Pt has tried benadryl which immediately improved swelling, but itching has persisted. She endorses associated diffuse body itching, swelling to her left wrist, sore throat, and rash.   Past Medical History:  Diagnosis Date  . Allergy   . Hypertension   . Sickle cell trait [place])     Patient Active Problem List   Diagnosis Date Noted  . Generalized anxiety disorder 01/22/2016  . Hypertension 10/23/2015  . Allergic rhinitis 10/23/2015  . Tobacco abuse 10/23/2015  . Spasm of muscle, back 10/23/2015  . Health care maintenance 10/23/2015    History reviewed. No pertinent surgical history.  OB History    No data available       Home Medications    Prior to Admission medications   Medication Sig Start Date End Date Taking? Authorizing Provider  aspirin 81 MG chewable tablet Chew 81 mg by mouth daily.    Historical Provider, MD  atenolol (TENORMIN) 50 MG tablet Take 1 tablet (50 mg total) by mouth daily. 04/04/16   Velna Ochs, MD  citalopram (CELEXA) 10 MG tablet Take 1 tablet  (10 mg total) by mouth daily. 01/22/16 01/21/17  Velna Ochs, MD  cyclobenzaprine (FLEXERIL) 10 MG tablet TAKE 1 TABLET(10 MG) BY MOUTH AT BEDTIME AS NEEDED FOR MUSCLE SPASMS 03/31/16   Velna Ochs, MD  hydrOXYzine (ATARAX/VISTARIL) 25 MG tablet Take 1 tablet (25 mg total) by mouth every 6 (six) hours as needed for itching. 04/06/16   Devaughn Savant Pilcher Andrews Tener, PA-C  ibuprofen (ADVIL,MOTRIN) 200 MG tablet Take 400-1,200 mg by mouth every 6 (six) hours as needed for moderate pain or cramping.    Historical Provider, MD  naproxen (NAPROSYN) 500 MG tablet Take 1 tablet (500 mg total) by mouth 2 (two) times daily with a meal. 01/22/16 01/21/17  Velna Ochs, MD  predniSONE (DELTASONE) 20 MG tablet Take 2 tablets (40 mg total) by mouth daily. 04/06/16   Ozella Almond Kadrian Partch, PA-C  sodium chloride (OCEAN) 0.65 % SOLN nasal spray Place 1 spray into both nostrils as needed for congestion. 10/23/15   Jones Bales, MD    Family History Family History  Problem Relation Age of Onset  . Cancer Mother   . Hypertension Father   . Stroke Father     Social History Social History  Substance Use Topics  . Smoking status: Current Every Day Smoker    Types: Cigarettes  . Smokeless tobacco: Never Used  . Alcohol use No     Allergies   Hydrocodone   Review of Systems Review of Systems  HENT: Positive for sore throat.   Musculoskeletal:  Positive for joint swelling.  Skin: Positive for rash.       Positive for Itching     Physical Exam Updated Vital Signs BP 141/96 (BP Location: Left Arm)   Pulse 78   Temp 98.2 F (36.8 C) (Oral)   Resp 18   SpO2 100%   Physical Exam  Constitutional: She is oriented to person, place, and time. She appears well-developed and well-nourished. No distress.  HENT:  Head: Normocephalic and atraumatic.  Airway patent.  Eyes: Pupils are equal, round, and reactive to light.  Cardiovascular: Normal rate, regular rhythm and normal heart sounds.   No murmur  heard. Pulmonary/Chest: No respiratory distress.  Lungs clear to auscultation bilaterally. No wheezes, rales, rhonchi. No increased effort in breathing.  Musculoskeletal:  Full range of motion without pain of the left upper extremity.  Neurological: She is alert and oriented to person, place, and time.  Skin: Skin is warm and dry. Rash noted.  Left wrist with 2cm area of redness and mild swelling which was tender to touch   Nursing note and vitals reviewed.    ED Treatments / Results  DIAGNOSTIC STUDIES:  Oxygen Saturation is 100% on RA, normal by my interpretation.    COORDINATION OF CARE:  5:40 PM Discussed treatment plan with pt at bedside which included Aterax and pt agreed to plan.  6:24 PM Pt evaluated and states she feels much better after Aterax treatment. Pt will be discharged with Aterax prescription as needed and steroid burst.   Labs (all labs ordered are listed, but only abnormal results are displayed) Labs Reviewed - No data to display  EKG  EKG Interpretation None       Radiology No results found.  Procedures Procedures (including critical care time)  Medications Ordered in ED Medications  hydrOXYzine (ATARAX/VISTARIL) tablet 10 mg (10 mg Oral Given 04/06/16 1748)  predniSONE (DELTASONE) tablet 40 mg (40 mg Oral Given 04/06/16 1747)     Initial Impression / Assessment and Plan / ED Course  I have reviewed the triage vital signs and the nursing notes.  Pertinent labs & imaging results that were available during my care of the patient were reviewed by me and considered in my medical decision making (see chart for details).  Clinical Course   Patient with contact dermatitis after exposure to perfume. Lungs clear and airway patent. 100% O2 on RA with no increased effort in breathing. Instructed to avoid offending agent. Will treat with Aterax PRN itching and Steroid burst. No signs of secondary infection. Follow up with PCP in 2-3 days. Return  precautions discussed. Pt is safe for discharge at this time.  Final Clinical Impressions(s) / ED Diagnoses   Final diagnoses:  Allergic reaction, initial encounter    New Prescriptions Discharge Medication List as of 04/06/2016  6:26 PM    START taking these medications   Details  hydrOXYzine (ATARAX/VISTARIL) 25 MG tablet Take 1 tablet (25 mg total) by mouth every 6 (six) hours as needed for itching., Starting Sat 04/06/2016, Print    predniSONE (DELTASONE) 20 MG tablet Take 2 tablets (40 mg total) by mouth daily., Starting Sat 04/06/2016, Print       I personally performed the services described in this documentation, which was scribed in my presence. The recorded information has been reviewed and is accurate.     [facility] Bernadette Gores, PA-C 04/06/16 1839    Davonna Belling, MD 04/07/16 [phone]

## 2016-04-06 NOTE — Discharge Instructions (Signed)
Take Atarax as needed for itching. Take steroid as directed for the next 3 days starting tomorrow morning. Return to ER for difficulty breathing, and no worsening symptoms, any additional concerns.

## 2016-04-06 NOTE — ED Notes (Signed)
Declined W/C at D/C and was escorted to lobby by RN. 

## 2016-04-06 NOTE — ED Triage Notes (Signed)
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  She had on gloves but it went through her gloves and her lt hand  Received most of the spill   Her lt  Hand was swollen until she washed it off.  And she felt like her throat was closing up she took 50mg s of benadryl she still has itching no hives.  No diff breathing

## 2016-04-15 ENCOUNTER — Ambulatory Visit (INDEPENDENT_AMBULATORY_CARE_PROVIDER_SITE_OTHER): Payer: Self-pay | Admitting: Internal Medicine

## 2016-04-15 VITALS — BP 124/100 | HR 73 | Temp 98.6°F | Ht 65.0 in | Wt 186.5 lb

## 2016-04-15 DIAGNOSIS — F411 Generalized anxiety disorder: Secondary | ICD-10-CM

## 2016-04-15 DIAGNOSIS — I10 Essential (primary) hypertension: Secondary | ICD-10-CM

## 2016-04-15 DIAGNOSIS — F17210 Nicotine dependence, cigarettes, uncomplicated: Secondary | ICD-10-CM

## 2016-04-15 DIAGNOSIS — M159 Polyosteoarthritis, unspecified: Secondary | ICD-10-CM

## 2016-04-15 DIAGNOSIS — M8949 Other hypertrophic osteoarthropathy, multiple sites: Secondary | ICD-10-CM

## 2016-04-15 DIAGNOSIS — M25541 Pain in joints of right hand: Secondary | ICD-10-CM

## 2016-04-15 DIAGNOSIS — M62830 Muscle spasm of back: Secondary | ICD-10-CM

## 2016-04-15 DIAGNOSIS — Z79899 Other long term (current) drug therapy: Secondary | ICD-10-CM

## 2016-04-15 DIAGNOSIS — G8929 Other chronic pain: Secondary | ICD-10-CM

## 2016-04-15 DIAGNOSIS — M25542 Pain in joints of left hand: Secondary | ICD-10-CM

## 2016-04-15 DIAGNOSIS — M150 Primary generalized (osteo)arthritis: Principal | ICD-10-CM

## 2016-04-15 MED ORDER — MELOXICAM 10 MG PO CAPS: 10.0000 mg | ORAL_CAPSULE | Freq: Every day | ORAL | 2 refills | Status: DC | PRN

## 2016-04-15 MED ORDER — DICLOFENAC SODIUM 1 % TD GEL: 4.0000 g | Freq: Four times a day (QID) | TRANSDERMAL | 0 refills | Status: DC

## 2016-04-15 MED ORDER — ATENOLOL 50 MG PO TABS: 50.0000 mg | ORAL_TABLET | Freq: Every day | ORAL | 3 refills | Status: DC

## 2016-04-15 MED ORDER — CYCLOBENZAPRINE HCL 10 MG PO TABS
ORAL_TABLET | ORAL | 0 refills | Status: DC
Start: 2016-04-15 — End: 2016-07-02

## 2016-04-15 NOTE — Progress Notes (Signed)
   CC: Back and joint pain  HPI:  [name] is a 45 y.o. female with PMH of HTN, Chronic back spasms who presents for management of her back and joint pain, grief, and HTN.  Back Spasms: Patient has chronic back spasms for which she takes Flexeril 10 mg at night and Naproxen twice daily. The Flexeril does provide relief and allows her to sleep at night. She says she cannot take Flexeril during the day due to increased somnolence which interferes with her work. She takes 1/2 a 500 mg Naproxen in the morning (full dose makes her sleepy) and a whole tab at night with mild relief.  Joint pain: Patient reports several years history of bilateral joint pain of her hands. Pain occurs at her PIP joints and is throughout the day. She does not notice improvement with activity. She states she has to "crack" her joints often to relieve some of her associated stiffness. The pain has flared up with colder temperatures. She has been taking Ibuprofen up to 4 times a day on top of her Naproxen without relief. She says she cannot take Tylenol due to a side effect of upset stomach. She reports milder but similar symptoms in her knees.  Grief: Patient lost four members of her family including her husband, mother, and father earlier this year and understandably was under considerable stress, grief, and anxiety. She was started on a trial of Citalopram in July and reports feeling back to her baseline. She would like to come off of Citalopram now that her mood is improved.  HTN: Patient currently takes Atenolol 50 mg daily. BP this visit is 124/100. She is asymptomatic. She says she does not want to adjust her antihypertensives at this time.  Past Medical History:  Diagnosis Date  . Allergy   . Hypertension   . Sickle cell trait ([place])     Review of Systems:   Review of Systems  Constitutional: Negative for fever.  Respiratory: Negative for shortness of breath.   Cardiovascular: Negative for chest pain.    Genitourinary:       No bowel/bladder incontinence  Musculoskeletal: Positive for back pain and joint pain. Negative for falls.  Neurological: Negative for dizziness, tingling, sensory change and headaches.  Psychiatric/Behavioral: Negative for depression and suicidal ideas.     Physical Exam:  Vitals:   04/15/16 1320  BP: (!) 124/100  Pulse: 73  Temp: 98.6 F (37 C)  TempSrc: Oral  SpO2: 99%  Weight: 186 lb 8 oz (84.6 kg)  Height: 5\' 5"  (1.651 m)   Physical Exam  Constitutional: She is oriented to person, place, and time. She appears well-developed and well-nourished. No distress.  Cardiovascular: Normal rate and regular rhythm.   Pulmonary/Chest: Effort normal. No respiratory distress. She has no wheezes. She has no rales.  Musculoskeletal: Normal range of motion. She exhibits no edema.  Mild tenderness along spinous processes and paraspinal muscles sporadically from cervical to lumbar region. Slight swelling of PIP joints of hands bilaterally  Neurological: She is alert and oriented to person, place, and time.  Skin: Skin is warm. She is not diaphoretic.  Psychiatric: She has a normal mood and affect.    Assessment & Plan:   See Encounters Tab for problem based charting.  Patient discussed with Dr. Eppie Gibson

## 2016-04-15 NOTE — Patient Instructions (Signed)
It was a pleasure to meet you Amber Franco.  For your back and join pain we will continue the Flexeril at night.  We will start Meloxicam 10 mg once a day.  STOP Naproxen and Ibuprofen.  We will try Voltaren gel to use on your joints and back.  I have discontinued your Citalopram and refilled your medications and sent them to the [facility] as requested.

## 2016-04-16 DIAGNOSIS — M130 Polyarthritis, unspecified: Secondary | ICD-10-CM | POA: Insufficient documentation

## 2016-04-16 NOTE — Assessment & Plan Note (Signed)
Patient lost four members of her family including her husband, mother, and father earlier this year and understandably was under considerable stress, grief, and anxiety. She was started on a trial of Citalopram in July and reports feeling back to her baseline. She would like to come off of Citalopram now that her mood is improved.  Will discontinue Citalopram and monitor off of SSRI. Do not need to taper as she is already on a low dose.

## 2016-04-16 NOTE — Assessment & Plan Note (Signed)
Patient has chronic back spasms for which she takes Flexeril 10 mg at night and Naproxen twice daily. The Flexeril does provide relief and allows her to sleep at night. She says she cannot take Flexeril during the day due to increased somnolence which interferes with her work. She takes 1/2 a 500 mg Naproxen in the morning (full dose makes her sleepy) and a whole tab at night with mild relief.  Will change Naproxen to once daily Meloxicam in attempt to provide better pain control. Have also advised patient to continue with heat pads, massage, Bengay/Icyhot. -Continue Flexeril 10 mg at night -Start Meloxicam 10 mg daily -Stop Naproxen and Ibuprofen -Voltaren gel -Heat/ice, Bengay/IcyHot, massage -Continue activity as tolerated

## 2016-04-16 NOTE — Assessment & Plan Note (Signed)
Patient currently takes Atenolol 50 mg daily. BP this visit is 124/100. She is asymptomatic, but reports stress at work. She says she does not want to adjust her antihypertensives at this time.  Refilled Atenolol 50 mg daily to [facility] on [address] per patient request. Will continue to monitor BP.

## 2016-04-16 NOTE — Assessment & Plan Note (Signed)
Patient reports several years history of bilateral joint pain of her hands. Pain occurs at her PIP joints and is throughout the day. She does not notice improvement with activity. She states she has to "crack" her joints often to relieve some of her associated stiffness. The pain has flared up with colder temperatures. She has been taking Ibuprofen up to 4 times a day on top of her Naproxen without relief. She says she cannot take Tylenol due to a side effect of upset stomach. She reports milder but similar symptoms in her knees.  Symptoms consistent with OA, less suspicion for rheumatoid arthritis at this time. Current NSAIDs have not provided relief and Tylenol is not an option per patient report. Will try alternate NSAID therapy. -Start Meloxicam 10 mg daily -Stop other oral NSAIDs (Naproxen, Ibuprofen) -Start Voltaren gel

## 2016-04-28 NOTE — Progress Notes (Signed)
Case discussed with Dr. Patel at the time of the visit.  We reviewed the resident's history and exam and pertinent patient test results.  I agree with the assessment, diagnosis, and plan of care documented in the resident's note. 

## 2016-07-02 ENCOUNTER — Encounter: Payer: Self-pay | Admitting: Internal Medicine

## 2016-07-02 ENCOUNTER — Ambulatory Visit (INDEPENDENT_AMBULATORY_CARE_PROVIDER_SITE_OTHER): Payer: Self-pay | Admitting: Internal Medicine

## 2016-07-02 ENCOUNTER — Encounter (INDEPENDENT_AMBULATORY_CARE_PROVIDER_SITE_OTHER): Payer: Self-pay

## 2016-07-02 VITALS — BP 133/89 | HR 80 | Temp 97.9°F | Ht 65.0 in | Wt 195.5 lb

## 2016-07-02 DIAGNOSIS — R296 Repeated falls: Secondary | ICD-10-CM

## 2016-07-02 DIAGNOSIS — M62830 Muscle spasm of back: Secondary | ICD-10-CM

## 2016-07-02 DIAGNOSIS — I10 Essential (primary) hypertension: Secondary | ICD-10-CM

## 2016-07-02 DIAGNOSIS — Z885 Allergy status to narcotic agent status: Secondary | ICD-10-CM

## 2016-07-02 DIAGNOSIS — M130 Polyarthritis, unspecified: Secondary | ICD-10-CM

## 2016-07-02 DIAGNOSIS — Z9181 History of falling: Secondary | ICD-10-CM

## 2016-07-02 DIAGNOSIS — F17210 Nicotine dependence, cigarettes, uncomplicated: Secondary | ICD-10-CM

## 2016-07-02 DIAGNOSIS — G8929 Other chronic pain: Secondary | ICD-10-CM

## 2016-07-02 DIAGNOSIS — Z79899 Other long term (current) drug therapy: Secondary | ICD-10-CM

## 2016-07-02 DIAGNOSIS — M549 Dorsalgia, unspecified: Secondary | ICD-10-CM

## 2016-07-02 MED ORDER — ATENOLOL 50 MG PO TABS: 50.0000 mg | ORAL_TABLET | Freq: Every day | ORAL | 3 refills | Status: DC

## 2016-07-02 MED ORDER — DULOXETINE HCL 30 MG PO CPEP: 30.0000 mg | ORAL_CAPSULE | Freq: Every day | ORAL | 0 refills | Status: DC

## 2016-07-02 MED ORDER — CYCLOBENZAPRINE HCL 10 MG PO TABS: ORAL_TABLET | ORAL | 0 refills | Status: DC

## 2016-07-02 NOTE — Assessment & Plan Note (Signed)
Blood pressure 133/89 today which is fair control. She may require a change in treatment if trending upward on additional visits.  I refilled her atenolol order today but changed to filling at [facility] rather than [facility] on [address].

## 2016-07-02 NOTE — Assessment & Plan Note (Addendum)
Her pain today is very atypical given how diffusely it affects her joints and muscles of the left side extending from her neck all the way to her distal leg but completely spares the right side. There is some paraspinal tenderness down the midline but otherwise the pain is limited to the left side with no pain at all on range of motion and against resistance in the right arm or the right leg. Her pain is pretty constant-not improving or worsening with use. It certainly was increased with some postural changes such as hip flexion and Neer's test.  Her symptoms have worsened now with multiple falls. This is also not characteristic of muscle spasm alone although musculoskeletal etiology cannot be excluded. The unilateral distribution suggests against a diffuse neuropathy or underlying cause with vitamin deficiency or a fibromyalgia. She also does not have that much fatigue along with the pain. I would like to exclude a radiculopathy underlying this pain now that there is tingling sensations and episodic weakness with falls occurring. Spinal MRI would also be demonstrative of MS.  -Recommended decreasing daily ibuprofen use to at most 4 times daily -Spinal MRI ordered in the next month -Start duloxetine at 30 mg and increase to 60 mg if tolerating -She'll follow up with PCP in a month

## 2016-07-02 NOTE — Patient Instructions (Addendum)
It was a pleasure to see you today Ms. Amber Franco.  Your pain is concerning because it does not seem truly characteristic of joint pains. I would like to recommend starting a medicine for neuropathy (Cymbalta) and arranging to get imaging of your back sometime in the next month.  This way you can follow up the results with recommendations from Dr. Hermenia Fiscal in about a month.

## 2016-07-02 NOTE — Progress Notes (Signed)
   CC: Back and joint pain  HPI:  [name] is a 46 y.o. female with PMH of HTN and chronic back and joint pains who is here for worsening of her chronic left sided pain. This pain is much increased for the past few weeks and she has now fallen twice within the past 2 weeks dur to her leg giving out on her when getting out of a car or navigating steps. She reports tingling with pain in the hand and foot but no complete numbness. More proximally she describes the pain as aching. This pain is often bad at night and awakens her from sleep episodically. She is taking large amounts of ibuprofen for her symptoms over 3 g daily without much reported benefit.  See problem based assessment and plan below for additional details.  Past Medical History:  Diagnosis Date  . Allergy   . Hypertension   . Sickle cell trait ([place])     Review of Systems:   Review of Systems  Constitutional: Negative for fever.  Respiratory: Negative for shortness of breath.   Cardiovascular: Negative for chest pain.  Genitourinary:       No bowel/bladder incontinence  Musculoskeletal: Positive for back pain, joint pain, and falls. Neurological: Positive for tingling and sensory change, no headaches.  Psychiatric/Behavioral: Negative for depression and suicidal ideas.    Physical Exam:  Vitals:   07/02/16 1418  BP: 133/89  Pulse: 80  Temp: 97.9 F (36.6 C)  TempSrc: Oral  SpO2: 99%  Weight: 195 lb 8 oz (88.7 kg)  Height: 5\' 5"  (1.651 m)   Physical Exam  Constitutional: She appears well-developed and well-nourished. No distress.  Cardiovascular: RRR, no murmurs.   Pulmonary/Chest: CTAB, She has no wheezes. She has no rales.  Musculoskeletal: Evaluation very limited by pain, she is asymptomatic on the right side but has severe tenderness along her spin, side, arm, and leg on the left side. Pain produced with movement of the wrist, shoulder, hip, or ankle. Skin: Skin is warm. She is not diaphoretic.    Psychiatric: She has a normal mood and affect.    Assessment & Plan:   See Encounters Tab for problem based charting.  Patient discussed with Dr. Lynnae January

## 2016-07-03 ENCOUNTER — Other Ambulatory Visit: Payer: Self-pay | Admitting: *Deleted

## 2016-07-03 DIAGNOSIS — M130 Polyarthritis, unspecified: Secondary | ICD-10-CM

## 2016-07-03 MED ORDER — DULOXETINE HCL 30 MG PO CPEP: 30.0000 mg | ORAL_CAPSULE | Freq: Every day | ORAL | 0 refills | Status: DC

## 2016-07-03 MED FILL — DULoxetine HCL 30 MG CPEP: 30 | 60 days supply | Qty: 60 | Fill #0

## 2016-07-03 NOTE — Telephone Encounter (Signed)
Pt states Cymbalta cost $247.00 at [facility]; requesting it be sent to [facility].

## 2016-07-09 NOTE — Progress Notes (Signed)
Internal Medicine Clinic Attending  Case discussed with Dr. Rice soon after the resident saw the patient.  We reviewed the resident's history and exam and pertinent patient test results.  I agree with the assessment, diagnosis, and plan of care documented in the resident's note. 

## 2016-07-30 ENCOUNTER — Ambulatory Visit (HOSPITAL_COMMUNITY): Admission: RE | Admit: 2016-07-30 | Payer: Self-pay | Source: Ambulatory Visit

## 2016-07-30 ENCOUNTER — Ambulatory Visit (HOSPITAL_COMMUNITY)
Admission: RE | Admit: 2016-07-30 | Discharge: 2016-07-30 | Disposition: A | Discharge status: Home / Self Care | Payer: Self-pay | Source: Ambulatory Visit | Attending: Student in an Organized Health Care Education/Training Program | Admitting: Student in an Organized Health Care Education/Training Program

## 2016-07-30 ENCOUNTER — Ambulatory Visit (HOSPITAL_COMMUNITY): Payer: Self-pay

## 2016-07-30 DIAGNOSIS — M4802 Spinal stenosis, cervical region: Secondary | ICD-10-CM | POA: Insufficient documentation

## 2016-07-30 DIAGNOSIS — D179 Benign lipomatous neoplasm, unspecified: Secondary | ICD-10-CM | POA: Insufficient documentation

## 2016-07-30 DIAGNOSIS — M130 Polyarthritis, unspecified: Secondary | ICD-10-CM | POA: Insufficient documentation

## 2016-08-04 NOTE — Progress Notes (Signed)
   CC: Back pain   HPI:  [name] is a 46 y.o. female with PMH of HTN, depression, anxiety, and chronic back pain who presents today for follow up. She reports her mood has been good off of the citalopram. She denies any depression or anxiety and feels she is coping well without the medication. She continues to complain of 10/10 back pain, with pain that radiates up her left arm and left leg. She describes the pain as a pins and needles sensation that is worse after being on her feet all day at work. Medications prescribed at her last visit have been ineffective (cymbalta, mobic, flexeril). Voltaren gel provides partial relief. Recent CT spine revealed an incidental paraspinal lipoma but no findings to explain her pain. Patients says she has fallen twice since her last office visit because of the pain and weakness. She denies any injury or trauma.  Past Medical History:  Diagnosis Date  . Allergy   . Hypertension   . Sickle cell trait ([place])     Review of Systems:  All pertinents listed in HPI, otherwise negative.   Physical Exam:  Vitals:   08/05/16 1354  BP: 119/81  Pulse: 88  Temp: 98.1 F (36.7 C)  TempSrc: Oral  SpO2: 100%  Weight: 188 lb 12.8 oz (85.6 kg)  Height: 5\' 5"  (1.651 m)   Constitutional: NAD, appears comfortable Cardiovascular: RRR, no murmurs, rubs, or gallops.  Pulmonary/Chest: CTAB, no wheezes, rales, or rhonchi.  Abdominal: Soft, non tender, non distended. +BS.  Extremities: Warm and well perfused. Distal pulses intact. No edema.  MSK: Normal range of motion, deep tendon reflexes 2+ bilaterally. Strength and sensation symmetric and intact bilaterally. Diffuse MSK tenderness to palpation over paraspinal muscles bilaterally.   Assessment & Plan:   See Encounters Tab for problem based charting.  Patient discussed with Dr. Dareen Piano

## 2016-08-04 NOTE — Assessment & Plan Note (Addendum)
Patient was started on citalopram in July 2017 while struggling with significant grief, stress, and anxiety related to the death of multiple family members. Mood improved on the citalopram, but patient requested that the medicine be stopped at her last office visit. Today, patient says she is doing well off the medication. She denies anxiety/deppression.  -- Continue to monitor off citalopram

## 2016-08-04 NOTE — Assessment & Plan Note (Deleted)
Patient has chronic back spasms for which she takes Flexeril 10 mg at night and was started on Meloxicam 10mg  daily at her last office visit. Today,  -Continue Flexeril 10 mg at night -Continue Meloxicam 10 mg daily -Voltaren gel -Heat/ice, Bengay/IcyHot, massage -Continue activity as tolerated

## 2016-08-04 NOTE — Assessment & Plan Note (Addendum)
Patient currently takes Atenolol 50 mg daily. BP today is well controlled, 119/81. Patient feels strongly about this medication in particular and does not want to change regimens.  -- Continue current regimen

## 2016-08-04 NOTE — Assessment & Plan Note (Addendum)
Patient is still smoking but says she is trying to quit. Down to 4 cigarettes a day. Patient says she never tried the nicotine patches we discussed at her last visit. Says she will continue to try and cut back on her own. Will follow up again next visit.  -- Encourage cessation

## 2016-08-05 ENCOUNTER — Telehealth: Payer: Self-pay | Admitting: Internal Medicine

## 2016-08-05 ENCOUNTER — Ambulatory Visit (INDEPENDENT_AMBULATORY_CARE_PROVIDER_SITE_OTHER): Payer: Self-pay | Admitting: Internal Medicine

## 2016-08-05 ENCOUNTER — Encounter: Payer: Self-pay | Admitting: Internal Medicine

## 2016-08-05 VITALS — BP 119/81 | HR 88 | Temp 98.1°F | Ht 65.0 in | Wt 188.8 lb

## 2016-08-05 DIAGNOSIS — F411 Generalized anxiety disorder: Secondary | ICD-10-CM

## 2016-08-05 DIAGNOSIS — Z79899 Other long term (current) drug therapy: Secondary | ICD-10-CM

## 2016-08-05 DIAGNOSIS — Z720 Tobacco use: Secondary | ICD-10-CM

## 2016-08-05 DIAGNOSIS — I10 Essential (primary) hypertension: Secondary | ICD-10-CM

## 2016-08-05 DIAGNOSIS — M549 Dorsalgia, unspecified: Secondary | ICD-10-CM

## 2016-08-05 DIAGNOSIS — F17210 Nicotine dependence, cigarettes, uncomplicated: Secondary | ICD-10-CM

## 2016-08-05 DIAGNOSIS — G8929 Other chronic pain: Secondary | ICD-10-CM

## 2016-08-05 DIAGNOSIS — Z9181 History of falling: Secondary | ICD-10-CM

## 2016-08-05 DIAGNOSIS — G629 Polyneuropathy, unspecified: Secondary | ICD-10-CM | POA: Insufficient documentation

## 2016-08-05 DIAGNOSIS — M792 Neuralgia and neuritis, unspecified: Secondary | ICD-10-CM

## 2016-08-05 DIAGNOSIS — Z0000 Encounter for general adult medical examination without abnormal findings: Secondary | ICD-10-CM

## 2016-08-05 DIAGNOSIS — Z8659 Personal history of other mental and behavioral disorders: Secondary | ICD-10-CM

## 2016-08-05 LAB — GLUCOSE, CAPILLARY: Glucose-Capillary: 85 mg/dL (ref 65–99)

## 2016-08-05 LAB — POCT GLYCOSYLATED HEMOGLOBIN (HGB A1C): Hemoglobin A1C: 6.2

## 2016-08-05 MED ORDER — DICLOFENAC SODIUM 1 % TD GEL: 4.0000 g | Freq: Four times a day (QID) | TRANSDERMAL | 3 refills | Status: DC

## 2016-08-05 MED ORDER — ATENOLOL 50 MG PO TABS: 50.0000 mg | ORAL_TABLET | Freq: Every day | ORAL | 3 refills | Status: DC

## 2016-08-05 MED ORDER — GABAPENTIN 300 MG PO CAPS: 300.0000 mg | ORAL_CAPSULE | Freq: Three times a day (TID) | ORAL | 3 refills | Status: DC

## 2016-08-05 MED FILL — DICLOFENAC SODIUM 1% GEL: 1 | 6 days supply | Qty: 100 | Fill #0

## 2016-08-05 NOTE — Telephone Encounter (Signed)
APT. REMINDER CALL, LMTCB °

## 2016-08-05 NOTE — Patient Instructions (Signed)
Amber Franco,  Please stop taking your Cymbalta. I have started you on a different medicine called gabapentin instead. Please take 1 tablet once today, then one tablet twice tomorrow, and then 1 tablet three times a day until you follow up in 3 months. I have referred you to physical therapy, they should call you to schedule an appointment. I have also ordered some blood tests and I will call you with the results. I have sent your new prescription and your refills to your pharmacy. I will see you in 3 months or sooner if you have any issues. If you have any questions or concerns, call our clinic at [phone] or after hours call [phone] and ask for the internal medicine resident on call. Thank you!

## 2016-08-05 NOTE — Assessment & Plan Note (Addendum)
Patient continues to complain of chronic back pain with pain that radiates up her left arm and left leg. She describes the pain as a "pins and needles" sensation. Patient say she has fallen twice since her last clinic visit but denies any trauma or injury. Recent CT spine revealed an incidental intramuscular paraspinal lipoma but no evidence of canal stenosis or cord compression to explain her symptoms. Patient is requesting something stronger for pain. She says that Cymbalta, mobic, and flexeril do not provide any relief. Voltaren gel helps somewhat. I would like to avoid narcotic pain medications given its high addiction potential and the fact that opioids are not very effective for neuropathic pain. Although her symptoms are unilateral, I will also rule out metabolic causes for her neuropathy. We discussed physical therapy for strength building exercises and patient is willing to try. Fibromyalgia is also high on my differential for this patient as she has many other associated symptoms (fatigue, depression/anxiety, migraines, MSK tenderness) however I would like to rule out other causes first.   -- PT referral  -- Will stop cymbalta as patient feels it is ineffective; will try Gabapentin 300 mg TID  -- Refilled voltaren gel  -- Checking HIV, A1C, PRP, TSH, Vit B12

## 2016-08-06 ENCOUNTER — Encounter: Payer: Self-pay | Admitting: Internal Medicine

## 2016-08-06 LAB — VITAMIN B12: Vitamin B-12: 695 pg/mL (ref 232–1245)

## 2016-08-06 LAB — RPR: RPR: NONREACTIVE

## 2016-08-06 LAB — TSH: TSH: 1.12 u[IU]/mL (ref 0.450–4.500)

## 2016-08-06 LAB — HIV ANTIBODY (ROUTINE TESTING W REFLEX): HIV Screen 4th Generation wRfx: NONREACTIVE

## 2016-08-06 NOTE — Progress Notes (Signed)
Internal Medicine Clinic Attending  Case discussed with Dr. Guilloud at the time of the visit.  We reviewed the resident's history and exam and pertinent patient test results.  I agree with the assessment, diagnosis, and plan of care documented in the resident's note.  

## 2016-08-20 ENCOUNTER — Ambulatory Visit: Payer: Self-pay

## 2016-08-26 ENCOUNTER — Ambulatory Visit: Payer: Self-pay | Admitting: Physical Therapy

## 2016-08-26 ENCOUNTER — Telehealth: Payer: Self-pay | Admitting: Internal Medicine

## 2016-08-26 NOTE — Telephone Encounter (Signed)
CALLED PT, LMTCB, IT IS TIME TO RENEW [facility] CARD

## 2016-09-10 ENCOUNTER — Telehealth: Payer: Self-pay | Admitting: Internal Medicine

## 2016-09-10 NOTE — Telephone Encounter (Signed)
APT. REMINDER CALL, LMTCB °

## 2016-09-11 ENCOUNTER — Ambulatory Visit: Payer: Self-pay

## 2016-09-11 ENCOUNTER — Other Ambulatory Visit: Payer: Self-pay | Admitting: *Deleted

## 2016-09-11 DIAGNOSIS — G629 Polyneuropathy, unspecified: Secondary | ICD-10-CM

## 2016-09-11 DIAGNOSIS — I10 Essential (primary) hypertension: Secondary | ICD-10-CM

## 2016-09-11 DIAGNOSIS — M62830 Muscle spasm of back: Secondary | ICD-10-CM

## 2016-09-11 MED ORDER — MELOXICAM 10 MG PO CAPS: 10.0000 mg | ORAL_CAPSULE | Freq: Every day | ORAL | 2 refills | Status: DC | PRN

## 2016-09-11 MED ORDER — CYCLOBENZAPRINE HCL 10 MG PO TABS: ORAL_TABLET | ORAL | 2 refills | Status: DC

## 2016-09-13 ENCOUNTER — Other Ambulatory Visit: Payer: Self-pay | Admitting: *Deleted

## 2016-09-13 ENCOUNTER — Other Ambulatory Visit: Payer: Self-pay | Admitting: Internal Medicine

## 2016-09-13 NOTE — Telephone Encounter (Signed)
Meloxicam 10 MG CAPS, cyclobenzaprine (FLEXERIL) 10 MG tablet, refill request.

## 2016-09-13 NOTE — Telephone Encounter (Signed)
Pharmacy calls and states that there is not a 10mg  meloxicam made, there is 7.5mg  or 10mg . Spoke w/ dr Heber [place] and gave VO for 7.5mg  tabs #30 1 tablet daily as needed w/ 2 additional refills.  Please correct medlist

## 2016-09-13 NOTE — Telephone Encounter (Signed)
[facility] electronic and fax has been down, called scripts in, she will pick up this pm, pt informed

## 2016-09-16 MED ORDER — MELOXICAM 7.5 MG PO TABS: 7.5000 mg | ORAL_TABLET | Freq: Every day | ORAL | 0 refills | Status: DC

## 2016-09-25 ENCOUNTER — Ambulatory Visit: Payer: Self-pay | Attending: Internal Medicine

## 2016-09-25 DIAGNOSIS — G8929 Other chronic pain: Secondary | ICD-10-CM | POA: Insufficient documentation

## 2016-09-25 DIAGNOSIS — R2689 Other abnormalities of gait and mobility: Secondary | ICD-10-CM | POA: Insufficient documentation

## 2016-09-25 DIAGNOSIS — M545 Low back pain, unspecified: Secondary | ICD-10-CM

## 2016-09-25 DIAGNOSIS — M79602 Pain in left arm: Secondary | ICD-10-CM | POA: Insufficient documentation

## 2016-09-25 NOTE — Patient Instructions (Signed)
HIP: Hamstrings - Short Sitting    Rest left leg on raised surface or on floor. Keep knee straight. Lift chest, bend at hips. Hold _30__ seconds. _3__ reps per set, _2-3__ sets per day, _7__ days per week  Copyright  VHI. All rights reserved.   Piriformis Stretch, Sitting    Sit, one ankle on opposite knee, same-side hand on crossed knee. Push down on left knee, keeping spine straight. Lean torso forward, with flat back, until tension is felt in hamstrings and gluteals of crossed-leg side. Hold _30__ seconds.  Repeat __3_ times per session. Do _2-3__ sessions per day. Perform daily.  Copyright  VHI. All rights reserved.    Shoulder Internal Rotation    Sit in chair grasp towel with one hand palm forward, arm extended above head, and other hand palm back behind back, arm bent elbow down. Pull gently upward. Hold __30__ seconds. Perform left arm only. Repeat __3__ times. Do __2-3__ sessions per day.  Copyright  VHI. All rights reserved.

## 2016-09-25 NOTE — Therapy (Addendum)
[facility] [address] [place], [place], [address] Phone: [phone]   Fax:  [phone]  Physical Therapy Evaluation  Patient Details  Name: Amber Franco MRN: [id number] Date of Birth: Sep 15, 1970 Referring Provider: Dr. Philipp Ovens  Encounter Date: 09/25/2016      PT End of Session - 09/25/16 1221    Visit Number 1   Number of Visits 4   Date for PT Re-Evaluation 10/25/16   Authorization Type GCCN: expired 08/14/16 per LL, emailed sent to [facility] to verify.   PT Start Time [phone]   PT Stop Time 1018   PT Time Calculation (min) 47 min   Activity Tolerance Patient tolerated treatment well   Behavior During Therapy WFL for tasks assessed/performed      Past Medical History:  Diagnosis Date  . Allergy   . Hypertension   . Sickle cell trait ([place])     History reviewed. No pertinent surgical history.  There were no vitals filed for this visit.       Subjective Assessment - 09/25/16 0937    Subjective Pt presents with neuropathy. Pt reports she's had four falls within a month, and a recent fall two days ago. Pt reports MRI revealed tumors in her spine and Dr. Benjamine Mola told her she had a minor stroke, which caused her to lose her feeling in Franco side. Pt continues to fall to the Franco side, while walking, due to LLE "locking up" when walking fast. Pt states gabapentin and flexoril make her hungry and tired, and has made pt gain weight. Pt reports she has memory issues, since the stroke.    Pertinent History HTN, anxiety, paraspinal lipoma per MD note   Patient Stated Goals Stop the pain, multi-task without the risk of falls   Currently in Pain? Yes   Pain Score 10-Worst pain ever   Pain Location Shoulder   Pain Orientation Left   Pain Descriptors / Indicators Pins and needles   Pain Type Chronic pain   Pain Radiating Towards down to side and back   Pain Onset More than a month ago   Pain Frequency Intermittent  "stays for 2  days and then eases up"   Aggravating Factors  move to fast and when overwhelmed   Pain Relieving Factors heating pad, adjusting bed (elevates HOB)            [facility] PT Assessment - 09/25/16 0945      Assessment   Medical Diagnosis Neuropathy   Referring Provider Dr. Philipp Ovens   Onset Date/Surgical Date 08/01/16   Hand Dominance Right   Prior Therapy none     Precautions   Precautions Fall     Restrictions   Weight Bearing Restrictions No     Balance Screen   Has the patient fallen in the past 6 months Yes   How many times? 5   Has the patient had a decrease in activity level because of a fear of falling?  No   Is the patient reluctant to leave their home because of a fear of falling?  No     Home Ecologist residence   Living Arrangements Other (Comment)  roommate   Available Help at Discharge Friend(s)   Type of Home Apartment   Home Access Level entry   [place] None   Additional Comments Pt is staying with a friend right now, in a house, to help her friend (due  to an ankle injury).     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    Leisure Shoot targets with gun     Cognition   Overall Cognitive Status Within Functional Limits for tasks assessed  pt reports cognitive issues with processing and memory     Sensation   Hot/Cold Impaired by gross assessment  Pt reports decr. hot/cold sensation in R hand   Additional Comments Pt reports N/T in R hand (1-3 digits). Pt reports decr. light touch in R digits (1-3) and decr. light touch LUE but pt reports it's getting better.     Coordination   Gross Motor Movements are Fluid and Coordinated Yes   Fine Motor Movements are Fluid and Coordinated Yes      Posture/Postural Control   Posture/Postural Control Postural limitations   Postural Limitations Increased lumbar lordosis     ROM / Strength   AROM / PROM / Strength AROM;Strength     AROM   Overall AROM  Within functional limits for tasks performed;Unable to assess   Overall AROM Comments Pt R UE and BLEs AROM WFL, however, pt unable to perform Franco shoulder IR 2/2 pain.     Strength   Overall Strength Deficits   Overall Strength Comments R UE WFL, Franco UE limited 2/2 pain. B hip flex: 4/5, knee ext: 4+/5, knee flex: 4/5, ankle DF: 4/5. B hip ext/abd not formally tested, B hip abd weakness suspected 2/2 pt unable to perform SLS.     Transfers   Transfers Sit to Stand;Stand to Sit   Sit to Stand 7: Independent;Without upper extremity assist;From chair/3-in-1   Stand to Sit 7: Independent;Without upper extremity assist;To chair/3-in-1     Ambulation/Gait   Ambulation/Gait Yes   Ambulation/Gait Assistance 7: Independent   Ambulation Distance (Feet) 300 Feet   Assistive device None   Gait Pattern Step-through pattern;Within Functional Limits   Ambulation Surface Level;Indoor   Gait velocity 4.75ft/sec.     Standardized Balance Assessment   Standardized Balance Assessment Timed Up and Go Test     Timed Up and Go Test   TUG Normal TUG   Normal TUG (seconds) 9.7  no AD     Functional Gait  Assessment   Gait assessed  Yes   Gait Level Surface Walks 20 ft in less than 5.5 sec, no assistive devices, good speed, no evidence for imbalance, normal gait pattern, deviates no more than 6 in outside of the 12 in walkway width.  4.7sec.   Change in Gait Speed Able to smoothly change walking speed without loss of balance or gait deviation. Deviate no more than 6 in outside of the 12 in walkway width.   Gait with Horizontal Head Turns Performs head turns smoothly with no change in gait. Deviates no more than 6 in outside 12 in walkway width   Gait with Vertical Head Turns Performs head turns with no  change in gait. Deviates no more than 6 in outside 12 in walkway width.   Gait and Pivot Turn Pivot turns safely within 3 sec and stops quickly with no loss of balance.   Step Over Obstacle Is able to step over 2 stacked shoe boxes taped together (9 in total height) without changing gait speed. No evidence of imbalance.  Gait with Narrow Base of Support Ambulates 7-9 steps.  pt had to stop but did not appear to be imbalanced.   Gait with Eyes Closed Walks 20 ft, no assistive devices, good speed, no evidence of imbalance, normal gait pattern, deviates no more than 6 in outside 12 in walkway width. Ambulates 20 ft in less than 7 sec.   Ambulating Backwards Walks 20 ft, no assistive devices, good speed, no evidence for imbalance, normal gait   Steps Alternating feet, must use rail.  pt held handrail due to fear of falling   Total Score 28             Therex: Pt performed stretches in seated position, with cues for technique. Please see pt instructions for details.               PT Education - 09/25/16 1220    Education provided Yes   Education Details PT discussed exam findings and PT POC/frequency/duration. PT provided pt with stretches for HEP, while on vacation.   Person(s) Educated Patient   Methods Explanation;Demonstration;Verbal cues;Handout   Comprehension Verbalized understanding;Returned demonstration          PT Short Term Goals - 09/25/16 1230      PT SHORT TERM GOAL #1   Title same as LTGs           PT Long Term Goals - 09/25/16 1230      PT LONG TERM GOAL #1   Title Pt will be IND in HEP to improve posture, strength, and pain. TARGET DATE FOR ALL LTGS: 10/23/16   Status New     PT LONG TERM GOAL #2   Title Pt will report pain </=8/10 during movement in order to perform ADLs.    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 MD notes state that the lipoma does not explain pt's pain. Pt's PMH significant for the following: HTN, anxiety, paraspinal lipoma per MD note. The following deficits were found during exam: decr. flexibility, postural dysfunction, pain and impaired strength. Pt's FGA of 28/30 indicates pt is at a low risk for falls. However, pt has fallen 5 times in 6 months. Pt's gait speed and TUG time are WNL. Pt also reported she has hx of CVA, but PT did not see this in MD notes. Pt reported cognitive issues with processing and memory but no significant limitations noted during exam. At times pt would appear to be balanced and then abruptly stop, stating the pain was too bad to continue. PT will provide stretches to decr. muscle guarding and assist in improved postural control, by  providing HEP. However, PT expained to pt we can't treat nerve pain but will address muscle tightness and hip strength deficits.    Rehab Potential Fair   Clinical Impairments Affecting Rehab Potential pt is currently undergoing testing for fibromyalgia and might need to have surgery for paraspinal lipoma per pt report   PT Frequency 1x / week   PT Duration 4 weeks   PT Treatment/Interventions ADLs/Self Care Home Management;Biofeedback;Neuromuscular re-education;Balance training;Therapeutic exercise;Therapeutic activities;Functional mobility training;Patient/family education;Manual techniques   PT Next Visit Plan Review stretches and provide add'Franco low back stretches and provide hip strengthening HEP.    Consulted and Agree with Plan of Care Patient  Patient will benefit from skilled therapeutic intervention in order to improve the following deficits and impairments:  Pain, Impaired flexibility, Postural dysfunction, Decreased strength  Visit Diagnosis: Other abnormalities of gait and mobility - Plan: PT plan of care cert/re-cert  Pain of left upper extremity - Plan: PT plan of  care cert/re-cert  Chronic left-sided low back pain without sciatica - Plan: PT plan of care cert/re-cert     Problem List Patient Active Problem List   Diagnosis Date Noted  . Neuropathy ([place]) 08/05/2016  . Polyarthritis 04/16/2016  . Generalized anxiety disorder 01/22/2016  . Hypertension 10/23/2015  . Allergic rhinitis 10/23/2015  . Tobacco abuse 10/23/2015  . Spasm of muscle, back 10/23/2015  . Health care maintenance 10/23/2015    Amber Franco 09/25/2016, 12:34 PM  [facility] [address] [place], [place], [address] Phone: [phone]   Fax:  [phone]  Name: Amber Franco MRN: [id number] Date of Birth: Oct 13, 1970   Geoffry Paradise, PT,DPT 09/25/16 12:34 PM Phone: [phone] Fax: [phone]

## 2016-10-04 ENCOUNTER — Ambulatory Visit: Payer: No Typology Code available for payment source | Attending: Internal Medicine

## 2016-10-04 DIAGNOSIS — M545 Low back pain, unspecified: Secondary | ICD-10-CM

## 2016-10-04 DIAGNOSIS — G8929 Other chronic pain: Secondary | ICD-10-CM | POA: Insufficient documentation

## 2016-10-04 DIAGNOSIS — M79602 Pain in left arm: Secondary | ICD-10-CM | POA: Insufficient documentation

## 2016-10-04 DIAGNOSIS — R2689 Other abnormalities of gait and mobility: Secondary | ICD-10-CM | POA: Insufficient documentation

## 2016-10-04 NOTE — Patient Instructions (Signed)
HIP: Hamstrings - Short Sitting    Rest left leg on raised surface or on floor. Keep knee straight. Lift chest, bend at hips. Hold _30__ seconds. _3__ reps per set, _2-3__ sets per day, _7__ days per week  Copyright  VHI. All rights reserved.   Piriformis Stretch, Sitting    Sit, one ankle on opposite knee, same-side hand on crossed knee. Push down on left knee, keeping spine straight. Lean torso forward, with flat back, until tension is felt in hamstrings and gluteals of crossed-leg side. Hold _30__ seconds.  Repeat __3_ times per session. Do _2-3__ sessions per day. Perform daily. **Perform left leg stretch while lying down instead of seated position.  Copyright  VHI. All rights reserved.    Shoulder Internal Rotation    Sit in chair grasp towel with one hand palm forward, arm extended above head, and other hand palm back behind back, arm bent elbow down. Pull gently upward. Hold __30__ seconds. Perform left arm only. Repeat __3__ times. Do __2-3__ sessions per day.  Copyright  VHI. All rights reserved.    Lower Trunk Rotation Stretch    Keeping back flat and feet together, rotate knees to left side. Hold ____ seconds. Repeat ____ times per set. Do ____ sets per session. Do ____ sessions per day.  http://orth.exer.us/123   Copyright  VHI. All rights reserved.   Bridge    Lie back, legs bent. Inhale, pressing hips up, hold for 2 seconds. Keeping ribs in, lengthen lower back. Exhale, rolling down along spine from top. Repeat _5___ times. Do __2__ sessions per day.  http://pm.exer.us/55   Copyright  VHI. All rights reserved.

## 2016-10-04 NOTE — Therapy (Signed)
[facility] [address] [place], [place], [address] Phone: [phone]   Fax:  [phone]  Physical Therapy Treatment  Patient Details  Name: Amber Franco MRN: [id number] Date of Birth: 1971-02-27 Referring Provider: Dr. Philipp Ovens  Encounter Date: 10/04/2016      PT End of Session - 10/04/16 1254    Visit Number 2   Number of Visits 4   Date for PT Re-Evaluation 10/25/16   Authorization Type GCCN: approved through 03/14/17 per LL   PT Start Time 1105   PT Stop Time 1145   PT Time Calculation (min) 40 min   Activity Tolerance Patient limited by pain   Behavior During Therapy [facility] for tasks assessed/performed      Past Medical History:  Diagnosis Date  . Allergy   . Hypertension   . Sickle cell trait ([place])     History reviewed. No pertinent surgical history.  There were no vitals filed for this visit.      Subjective Assessment - 10/04/16 1106    Subjective Pt denied falls since last visit. Pt emailed MD regarding gabapentin questions. Pt reported she has not performed stretches 2/2 she had an emergency in [place].    Pertinent History HTN, anxiety, paraspinal lipoma per MD note   Patient Stated Goals Stop the pain, multi-task without the risk of falls   Currently in Pain? Yes   Pain Score --  8.5/10   Pain Location Neck   Pain Orientation Mid;Lower   Pain Descriptors / Indicators Tightness   Pain Type Chronic pain   Pain Radiating Towards down to lower back   Pain Onset More than a month ago   Pain Frequency Intermittent   Aggravating Factors  looking down (worse as she stayed up studying)   Pain Relieving Factors massaging machine           Therex: Pt performed previous and new HEP. PT modified seated L piriformis stretch to supine to reduce discomfort. Pt required cues for technique and to educate pt on the importance of continuing to perform activities. Please see pt instructions for details.                         PT Education - 10/04/16 1253    Education provided Yes   Education Details PT reviewed previous HEP with pt, as pt reported she had not performed since eval due to travelling. PT added additional exercises to HEP.    Person(s) Educated Patient;Other (comment)  friend-Andrea   Methods Explanation;Demonstration;Tactile cues;Verbal cues;Handout   Comprehension Returned demonstration;Verbalized understanding          PT Short Term Goals - 09/25/16 1230      PT SHORT TERM GOAL #1   Title same as LTGs           PT Long Term Goals - 09/25/16 1230      PT LONG TERM GOAL #1   Title Pt will be IND in HEP to improve posture, strength, and pain. TARGET DATE FOR ALL LTGS: 10/23/16   Status New     PT LONG TERM GOAL #2   Title Pt will report pain </=8/10 during movement in order to perform ADLs.    Status New               Plan - 10/04/16 1256    Clinical Impression Statement Today's skilled session focused on stretching and strengthening HEP, in order to improve flexibility, strength,  and pain. Pt required a rest break 2/2 incr. LLE pain during seated piriformis stretch. PT modified stretch to supine and pt was able to tolerate. PT will likely d/c pt next session, as primary PT goal was to administer pt HEP in order to reduce pain and improve strength/flexibility and to improve activity tolerance.    Rehab Potential Fair   Clinical Impairments Affecting Rehab Potential pt is currently undergoing testing for fibromyalgia and might need to have surgery for paraspinal lipoma per pt report   PT Frequency 1x / week   PT Duration 4 weeks   PT Treatment/Interventions ADLs/Self Care Home Management;Biofeedback;Neuromuscular re-education;Balance training;Therapeutic exercise;Therapeutic activities;Functional mobility training;Patient/family education;Manual techniques   PT Next Visit Plan Review HEP and d/c as appropriate.    Consulted and Agree  with Plan of Care Patient;Other (Comment)  friend   Family Member Consulted Seth Bake      Patient will benefit from skilled therapeutic intervention in order to improve the following deficits and impairments:  Pain, Impaired flexibility, Postural dysfunction, Decreased strength  Visit Diagnosis: Other abnormalities of gait and mobility  Pain of left upper extremity  Chronic left-sided low back pain without sciatica     Problem List Patient Active Problem List   Diagnosis Date Noted  . Neuropathy ([place]) 08/05/2016  . Polyarthritis 04/16/2016  . Generalized anxiety disorder 01/22/2016  . Hypertension 10/23/2015  . Allergic rhinitis 10/23/2015  . Tobacco abuse 10/23/2015  . Spasm of muscle, back 10/23/2015  . Health care maintenance 10/23/2015    Anadelia Kintz L 10/04/2016, 1:01 PM  [facility] [address] [place], [place], [address] Phone: [phone]   Fax:  [phone]  Name: Amber Franco MRN: [id number] Date of Birth: Oct 04, 1970  Geoffry Paradise, PT,DPT 10/04/16 1:02 PM Phone: [phone] Fax: [phone]

## 2016-10-10 ENCOUNTER — Ambulatory Visit: Payer: No Typology Code available for payment source | Admitting: Physical Therapy

## 2016-10-14 ENCOUNTER — Ambulatory Visit: Payer: No Typology Code available for payment source | Admitting: Physical Therapy

## 2016-10-15 ENCOUNTER — Emergency Department (HOSPITAL_COMMUNITY)
Admission: EM | Admit: 2016-10-15 | Discharge: 2016-10-16 | Disposition: A | Discharge status: Home / Self Care | Payer: No Typology Code available for payment source | Attending: Emergency Medicine | Admitting: Emergency Medicine

## 2016-10-15 ENCOUNTER — Emergency Department (HOSPITAL_COMMUNITY): Payer: No Typology Code available for payment source

## 2016-10-15 ENCOUNTER — Encounter (HOSPITAL_COMMUNITY): Payer: Self-pay | Admitting: Emergency Medicine

## 2016-10-15 DIAGNOSIS — Z79899 Other long term (current) drug therapy: Secondary | ICD-10-CM | POA: Insufficient documentation

## 2016-10-15 DIAGNOSIS — Z5181 Encounter for therapeutic drug level monitoring: Secondary | ICD-10-CM | POA: Insufficient documentation

## 2016-10-15 DIAGNOSIS — R531 Weakness: Secondary | ICD-10-CM | POA: Insufficient documentation

## 2016-10-15 DIAGNOSIS — F985 Adult onset fluency disorder: Secondary | ICD-10-CM | POA: Insufficient documentation

## 2016-10-15 DIAGNOSIS — F8081 Childhood onset fluency disorder: Secondary | ICD-10-CM

## 2016-10-15 DIAGNOSIS — Z7982 Long term (current) use of aspirin: Secondary | ICD-10-CM | POA: Insufficient documentation

## 2016-10-15 DIAGNOSIS — Z8673 Personal history of transient ischemic attack (TIA), and cerebral infarction without residual deficits: Secondary | ICD-10-CM | POA: Insufficient documentation

## 2016-10-15 DIAGNOSIS — F17210 Nicotine dependence, cigarettes, uncomplicated: Secondary | ICD-10-CM | POA: Insufficient documentation

## 2016-10-15 DIAGNOSIS — R479 Unspecified speech disturbances: Secondary | ICD-10-CM

## 2016-10-15 DIAGNOSIS — I10 Essential (primary) hypertension: Secondary | ICD-10-CM | POA: Insufficient documentation

## 2016-10-15 HISTORY — DX: Cerebral infarction, unspecified: I63.9

## 2016-10-15 LAB — I-STAT CHEM 8, ED
BUN: 7 mg/dL (ref 6–20)
CHLORIDE: 106 mmol/L (ref 101–111)
Calcium, Ion: 1.09 mmol/L — ABNORMAL LOW (ref 1.15–1.40)
Creatinine, Ser: 0.8 mg/dL (ref 0.44–1.00)
Glucose, Bld: 107 mg/dL — ABNORMAL HIGH (ref 65–99)
HEMATOCRIT: 38 % (ref 36.0–46.0)
HEMOGLOBIN: 12.9 g/dL (ref 12.0–15.0)
POTASSIUM: 3.7 mmol/L (ref 3.5–5.1)
SODIUM: 138 mmol/L (ref 135–145)
TCO2: 24 mmol/L (ref 0–100)

## 2016-10-15 LAB — CBC
HCT: 39.4 % (ref 36.0–46.0)
Hemoglobin: 13.6 g/dL (ref 12.0–15.0)
MCH: 29.9 pg (ref 26.0–34.0)
MCHC: 34.5 g/dL (ref 30.0–36.0)
MCV: 86.6 fL (ref 78.0–100.0)
PLATELETS: 281 10*3/uL (ref 150–400)
RBC: 4.55 MIL/uL (ref 3.87–5.11)
RDW: 11.9 % (ref 11.5–15.5)
WBC: 11.8 10*3/uL — ABNORMAL HIGH (ref 4.0–10.5)

## 2016-10-15 LAB — COMPREHENSIVE METABOLIC PANEL
ALBUMIN: 3.8 g/dL (ref 3.5–5.0)
ALK PHOS: 49 U/L (ref 38–126)
ALT: 16 U/L (ref 14–54)
ANION GAP: 11 (ref 5–15)
AST: 17 U/L (ref 15–41)
BUN: 7 mg/dL (ref 6–20)
CO2: 21 mmol/L — AB (ref 22–32)
Calcium: 9.3 mg/dL (ref 8.9–10.3)
Chloride: 105 mmol/L (ref 101–111)
Creatinine, Ser: 0.77 mg/dL (ref 0.44–1.00)
GFR calc non Af Amer: >60 mL/min (ref >60)
GLUCOSE: 107 mg/dL — AB (ref 65–99)
POTASSIUM: 3.8 mmol/L (ref 3.5–5.1)
SODIUM: 137 mmol/L (ref 135–145)
Total Bilirubin: 0.3 mg/dL (ref 0.3–1.2)
Total Protein: 6.8 g/dL (ref 6.5–8.1)

## 2016-10-15 LAB — PROTIME-INR
INR: 0.99
PROTHROMBIN TIME: 13.1 s (ref 11.4–15.2)

## 2016-10-15 LAB — DIFFERENTIAL
BASOS PCT: 0 %
Basophils Absolute: 0 10*3/uL (ref 0.0–0.1)
EOS ABS: 0.3 10*3/uL (ref 0.0–0.7)
EOS PCT: 2 %
LYMPHS PCT: 34 %
Lymphs Abs: 4 10*3/uL (ref 0.7–4.0)
Monocytes Absolute: 0.5 10*3/uL (ref 0.1–1.0)
Monocytes Relative: 5 %
NEUTROS PCT: 59 %
Neutro Abs: 7 10*3/uL (ref 1.7–7.7)

## 2016-10-15 LAB — I-STAT TROPONIN, ED: Troponin i, poc: 0 ng/mL (ref 0.00–0.08)

## 2016-10-15 LAB — APTT: aPTT: 31 seconds (ref 24–36)

## 2016-10-15 LAB — CBG MONITORING, ED: GLUCOSE-CAPILLARY: 103 mg/dL — AB (ref 65–99)

## 2016-10-15 NOTE — ED Notes (Signed)
Neurology at bedside.

## 2016-10-15 NOTE — ED Triage Notes (Signed)
Pt arrives from home via POV with daughter, s/s left sided "paralysis" and aphasia since 2130. LSW 1900. Daughter states pt called her with abnormalities in speech. Pt stutturing over her words. No facial droop noted in triage, no effort against gravity in L arm and L leg, reports no feeling in L arm and L leg. Hx CVA with intermittent aphasia residual deficit.

## 2016-10-15 NOTE — ED Provider Notes (Signed)
[facility] DEPT Provider Note   CSN: [id number] Arrival date & time: 10/15/16  2224     History   Chief Complaint Chief Complaint  Patient presents with  . Code Stroke    HPI Amber Franco is a 46 y.o. female.  The history is provided by the patient and medical records.    46 year old female with history of allergies, hypertension, sickle cell trait, reported history of stroke, neuropathy, anxiety, presenting to the ED with slurred speech and left-sided weakness. Code stroke was activated in triage upon patient's arrival.  Last known well 1900. Patient's daughter who is present at the bedside reports that patient called her with changes in her speech. Reports she was stuttering. Upon arrival here patient reports she is unable to move her left arm or left leg for she has no sensation. Patient reports history of stroke in the past and has history of intermittent aphasia since then. Patient is a daily smoker. Takes daily ASA, no other anti-coagulation.  Past Medical History:  Diagnosis Date  . Allergy   . Hypertension   . Sickle cell trait ([place])   . Stroke [facility])     Patient Active Problem List   Diagnosis Date Noted  . Neuropathy 08/05/2016  . Polyarthritis 04/16/2016  . Generalized anxiety disorder 01/22/2016  . Hypertension 10/23/2015  . Allergic rhinitis 10/23/2015  . Tobacco abuse 10/23/2015  . Spasm of muscle, back 10/23/2015  . Health care maintenance 10/23/2015    History reviewed. No pertinent surgical history.  OB History    No data available       Home Medications    Prior to Admission medications   Medication Sig Start Date End Date Taking? Authorizing Provider  aspirin 81 MG chewable tablet Chew 81 mg by mouth daily.    Historical Provider, MD  atenolol (TENORMIN) 50 MG tablet Take 1 tablet (50 mg total) by mouth daily. 08/05/16   Velna Ochs, MD  cyclobenzaprine (FLEXERIL) 10 MG tablet TAKE 1 TABLET(10 MG) BY MOUTH AT BEDTIME AS NEEDED FOR MUSCLE  SPASMS 09/11/16   Velna Ochs, MD  diclofenac sodium (VOLTAREN) 1 % GEL Apply 4 g topically 4 (four) times daily. 08/05/16   Velna Ochs, MD  gabapentin (NEURONTIN) 300 MG capsule Take 1 capsule (300 mg total) by mouth 3 (three) times daily. 08/05/16 08/05/17  Velna Ochs, MD  meloxicam (MOBIC) 7.5 MG tablet Take 1 tablet (7.5 mg total) by mouth daily. 09/16/16 09/16/17  Velna Ochs, MD  sodium chloride (OCEAN) 0.65 % SOLN nasal spray Place 1 spray into both nostrils as needed for congestion. Patient not taking: Reported on 09/25/2016 10/23/15   Jones Bales, MD    Family History Family History  Problem Relation Age of Onset  . Cancer Mother   . Hypertension Father   . Stroke Father     Social History Social History  Substance Use Topics  . Smoking status: Current Every Day Smoker    Packs/day: 0.25    Types: Cigarettes  . Smokeless tobacco: Never Used     Comment: 4 per day   . Alcohol use No     Allergies   Hydrocodone   Review of Systems Review of Systems  Neurological: Positive for speech difficulty and weakness.  All other systems reviewed and are negative.    Physical Exam Updated Vital Signs BP 124/86   Pulse (!) 101   Temp 99.1 F (37.3 C) (Oral)   Resp (!) 26   SpO2 95%   Physical  Exam  Constitutional: She is oriented to person, place, and time. She appears well-developed and well-nourished.  HENT:  Head: Normocephalic and atraumatic.  Mouth/Throat: Oropharynx is clear and moist.  Eyes: Conjunctivae and EOM are normal. Pupils are equal, round, and reactive to light.  Neck: Normal range of motion.  Cardiovascular: Normal rate, regular rhythm and normal heart sounds.   Pulmonary/Chest: Effort normal and breath sounds normal. No respiratory distress. She has no wheezes.  Abdominal: Soft. Bowel sounds are normal.  Musculoskeletal: Normal range of motion.  Neurological: She is alert and oriented to person, place, and time.  AAOx3; no  movement in the left arm or leg when prompted but does have some spontaneous movements observed; able to move right arm and leg against resistance without issue; stuttering speech noted which is somewhat inconsistent during exam, no true aphasia noted  Skin: Skin is warm and dry.  Psychiatric: She has a normal mood and affect.  Nursing note and vitals reviewed.    ED Treatments / Results  Labs (all labs ordered are listed, but only abnormal results are displayed) Labs Reviewed  CBC - Abnormal; Notable for the following:       Result Value   WBC 11.8 (*)    All other components within normal limits  CBG MONITORING, ED - Abnormal; Notable for the following:    Glucose-Capillary 103 (*)    All other components within normal limits  I-STAT CHEM 8, ED - Abnormal; Notable for the following:    Glucose, Bld 107 (*)    Calcium, Ion 1.09 (*)    All other components within normal limits  DIFFERENTIAL  PROTIME-INR  APTT  COMPREHENSIVE METABOLIC PANEL  I-STAT TROPOININ, ED    EKG  EKG Interpretation None       Radiology Ct Head Code Stroke W/o Cm  Result Date: 10/15/2016 CLINICAL DATA:  Code stroke.  Left-sided weakness and aphasia. EXAM: CT HEAD WITHOUT CONTRAST TECHNIQUE: Contiguous axial images were obtained from the base of the skull through the vertex without intravenous contrast. COMPARISON:  None. FINDINGS: Brain: Normal appearance without evidence of atrophy, old or acute infarction, mass lesion, hemorrhage, hydrocephalus or extra-axial collection. Vascular: No vascular calcification or hyperdense vessels. Skull: Normal Sinuses/Orbits: Clear/normal Other: None ASPECTS ([place] Stroke Program Early CT Score) - Ganglionic level infarction (caudate, lentiform nuclei, internal capsule, insula, M1-M3 cortex): 7 - Supraganglionic infarction (M4-M6 cortex): 3 Total score (0-10 with 10 being normal): 10 IMPRESSION: 1. Normal study 2. ASPECTS is 10 These results were called by telephone at  the time of interpretation on 10/15/2016 at 10:50 pm to Dr. Nicole Kindred , who verbally acknowledged these results. Electronically Signed   By: Nelson Chimes M.D.   On: 10/15/2016 22:53    Procedures Procedures (including critical care time)  Medications Ordered in ED Medications - No data to display   Initial Impression / Assessment and Plan / ED Course  I have reviewed the triage vital signs and the nursing notes.  Pertinent labs & imaging results that were available during my care of the patient were reviewed by me and considered in my medical decision making (see chart for details).  46 year old female here with left-sided weakness and changes in speech. Last known well 1900. Here patient is observed to have some spontaneous movements of her left arm and leg, however does not perform any commands when prompted with her left-sided extremities. She's not had any apparent aphasia, but does have some noted stuttering speech which is somewhat inconsistent  throughout her exam. Symptoms are not truly concerning for acute stroke. Patient does report history of stroke and TIAs in the past.  Dr. Nicole Kindred with neurology has evaluated patient-- recommends MRI now, if negative can be discharged home with outpatient neuro follow-up.  1:54 AM Patient's MRI reviewed, no acute findings to explain her symptoms today.  She does have hx of chronic back pain-- actually had MRI of complete spine earlier this year.  She has not had any recent injury/trauma/falls to suggest acute SCI. I discussed patient's symptoms further with patient's daughter, she does report she recently moved and has had some stress in her life.  States she does have some issues with mood swings as well. May be a psychogenic component to her symptoms from stress reaction.  We'll have her follow-up with her primary care doctor. She was also given outpatient referral to neurology for ongoing care.  Discussed plan with patient, she acknowledged  understanding and agreed with plan of care.  Return precautions given for new or worsening symptoms.  Final Clinical Impressions(s) / ED Diagnoses   Final diagnoses:  Left-sided weakness  Stuttering    New Prescriptions Discharge Medication List as of 10/16/2016  1:57 AM       Larene Pickett, PA-C 10/16/16 [place], MD 10/16/16 1504

## 2016-10-15 NOTE — Consult Note (Signed)
Admission H&P    Chief Complaint: Speech difficulty and left-sided weakness.  HPI: Amber Franco is an 46 y.o. female with a history of sickle cell and hypertension presenting with complaint of speech output difficulty and left-sided weakness. Patient was last known well at 7:00 PM tonight. She has no previous documentation of stroke or TIA. CT scan of her head showed no acute intracranial abnormality. Speech pattern and the ED was not consistent with expressive aphasia, and poor and inconsistent effort was noted with voluntary movement of left extremities. She had no facial droop. NIH stroke score was 11, based on what appeared to be largely functional deficits. MRI of the brain without contrast is pending.  Past Medical History:  Diagnosis Date  . Allergy   . Hypertension   . Sickle cell trait ([place])   . Stroke [facility])     History reviewed. No pertinent surgical history.  Family History  Problem Relation Age of Onset  . Cancer Mother   . Hypertension Father   . Stroke Father    Social History:  reports that she has been smoking Cigarettes.  She has been smoking about 0.25 packs per day. She has never used smokeless tobacco. She reports that she does not drink alcohol or use drugs.  Allergies:  Allergies  Allergen Reactions  . Hydrocodone Anaphylaxis    Medications: Preadmission medications were reviewed by me.  ROS: History obtained from chart review and the patient  General ROS: negative for - chills, fatigue, fever, night sweats, weight gain or weight loss Psychological ROS: negative for - behavioral disorder, hallucinations, memory difficulties, mood swings or suicidal ideation Ophthalmic ROS: negative for - blurry vision, double vision, eye pain or loss of vision ENT ROS: negative for - epistaxis, nasal discharge, oral lesions, sore throat, tinnitus or vertigo Allergy and Immunology ROS: negative for - hives or itchy/watery eyes Hematological and Lymphatic ROS: negative for  - bleeding problems, bruising or swollen lymph nodes Endocrine ROS: negative for - galactorrhea, hair pattern changes, polydipsia/polyuria or temperature intolerance Respiratory ROS: negative for - cough, hemoptysis, shortness of breath or wheezing Cardiovascular ROS: negative for - chest pain, dyspnea on exertion, edema or irregular heartbeat Gastrointestinal ROS: negative for - abdominal pain, diarrhea, hematemesis, nausea/vomiting or stool incontinence Genito-Urinary ROS: negative for - dysuria, hematuria, incontinence or urinary frequency/urgency Musculoskeletal ROS: negative for - joint swelling or muscular weakness Neurological ROS: as noted in HPI; patient is currently in outpatient therapy for gait changes associated with peripheral neuropathy Dermatological ROS: negative for rash and skin lesion changes  Physical Examination: Blood pressure 124/90, pulse 100, temperature 99.1 F (37.3 C), temperature source Oral, resp. rate 20, SpO2 96 %.  HEENT-  Normocephalic, no lesions, without obvious abnormality.  Normal external eye and conjunctiva.  Normal TM's bilaterally.  Normal auditory canals and external ears. Normal external nose, mucus membranes and septum.  Normal pharynx. Neck supple with no masses, nodes, nodules or enlargement. Cardiovascular - regular rate and rhythm, S1, S2 normal, no murmur, click, rub or gallop Lungs - chest clear, no wheezing, rales, normal symmetric air entry Abdomen - soft, non-tender; bowel sounds normal; no masses,  no organomegaly Extremities - no joint deformities, effusion, or inflammation  Neurologic Examination: Mental Status: Alert, oriented, no acute distress.  Stuttering speech output without evidence of aphasia. Able to follow commands. Cranial Nerves: II-Visual fields were normal. III/IV/VI-Pupils were equal and reacted normally to light. Extraocular movements were full and conjugate.    V/VII-subjective absence of sensation involving left  side of the face no facial weakness. VIII-normal. X-dysarthria with stuttering and intermittent deliberate slurring of words. Motor: No weakness of right extremities; patient could briefly maintain left upper and lower extremities against gravity when a stent positioned by examiner. Sensory: Subjective absence of sensation over the entire left side. Deep Tendon Reflexes: 1+ and symmetric in upper extremities and absent in lower extremities Plantars: Mute bilaterally Cerebellar: Normal finger-to-nose testing with use of right upper extremity. Carotid auscultation: Normal  Results for orders placed or performed during the hospital encounter of 10/15/16 (from the past 48 hour(s))  CBG monitoring, ED     Status: Abnormal   Collection Time: 10/15/16 10:40 PM  Result Value Ref Range   Glucose-Capillary 103 (H) 65 - 99 mg/dL  Protime-INR     Status: None   Collection Time: 10/15/16 10:46 PM  Result Value Ref Range   Prothrombin Time 13.1 11.4 - 15.2 seconds   INR 0.99   APTT     Status: None   Collection Time: 10/15/16 10:46 PM  Result Value Ref Range   aPTT 31 24 - 36 seconds  CBC     Status: Abnormal   Collection Time: 10/15/16 10:46 PM  Result Value Ref Range   WBC 11.8 (H) 4.0 - 10.5 K/uL   RBC 4.55 3.87 - 5.11 MIL/uL   Hemoglobin 13.6 12.0 - 15.0 g/dL   HCT 39.4 36.0 - 46.0 %   MCV 86.6 78.0 - 100.0 fL   MCH 29.9 26.0 - 34.0 pg   MCHC 34.5 30.0 - 36.0 g/dL   RDW 11.9 11.5 - 15.5 %   Platelets 281 150 - 400 K/uL  Differential     Status: None   Collection Time: 10/15/16 10:46 PM  Result Value Ref Range   Neutrophils Relative % 59 %   Neutro Abs 7.0 1.7 - 7.7 K/uL   Lymphocytes Relative 34 %   Lymphs Abs 4.0 0.7 - 4.0 K/uL   Monocytes Relative 5 %   Monocytes Absolute 0.5 0.1 - 1.0 K/uL   Eosinophils Relative 2 %   Eosinophils Absolute 0.3 0.0 - 0.7 K/uL   Basophils Relative 0 %   Basophils Absolute 0.0 0.0 - 0.1 K/uL  I-stat troponin, ED     Status: None   Collection  Time: 10/15/16 10:48 PM  Result Value Ref Range   Troponin i, poc 0.00 0.00 - 0.08 ng/mL   Comment 3            Comment: Due to the release kinetics of cTnI, a negative result within the first hours of the onset of symptoms does not rule out myocardial infarction with certainty. If myocardial infarction is still suspected, repeat the test at appropriate intervals.   I-Stat Chem 8, ED     Status: Abnormal   Collection Time: 10/15/16 10:49 PM  Result Value Ref Range   Sodium 138 135 - 145 mmol/L   Potassium 3.7 3.5 - 5.1 mmol/L   Chloride 106 101 - 111 mmol/L   BUN 7 6 - 20 mg/dL   Creatinine, Ser 0.80 0.44 - 1.00 mg/dL   Glucose, Bld 107 (H) 65 - 99 mg/dL   Calcium, Ion 1.09 (L) 1.15 - 1.40 mmol/L   TCO2 24 0 - 100 mmol/L   Hemoglobin 12.9 12.0 - 15.0 g/dL   HCT 38.0 36.0 - 46.0 %   Ct Head Code Stroke W/o Cm  Result Date: 10/15/2016 CLINICAL DATA:  Code stroke.  Left-sided weakness and aphasia. EXAM:  CT HEAD WITHOUT CONTRAST TECHNIQUE: Contiguous axial images were obtained from the base of the skull through the vertex without intravenous contrast. COMPARISON:  None. FINDINGS: Brain: Normal appearance without evidence of atrophy, old or acute infarction, mass lesion, hemorrhage, hydrocephalus or extra-axial collection. Vascular: No vascular calcification or hyperdense vessels. Skull: Normal Sinuses/Orbits: Clear/normal Other: None ASPECTS ([place] Stroke Program Early CT Score) - Ganglionic level infarction (caudate, lentiform nuclei, internal capsule, insula, M1-M3 cortex): 7 - Supraganglionic infarction (M4-M6 cortex): 3 Total score (0-10 with 10 being normal): 10 IMPRESSION: 1. Normal study 2. ASPECTS is 10 These results were called by telephone at the time of interpretation on 10/15/2016 at 10:50 pm to Dr. Nicole Kindred , who verbally acknowledged these results. Electronically Signed   By: Nelson Chimes M.D.   On: 10/15/2016 22:53    Assessment/Plan 46 year old lady with sickle cell trait  hypertension presenting with changes in speech output and apparent side weakness. Patient has no objective acute neurologic deficits. MRI of the brain showed no acute ischemic lesion.  No further neurological intervention is indicated at this point. If patient's apparent deficits persist, recommend considering psychiatric intervention.  C.R. Nicole Kindred, MD [facility] [phone]  10/15/2016, 11:10 PM

## 2016-10-15 NOTE — ED Notes (Signed)
Patient transported to MRI 

## 2016-10-16 ENCOUNTER — Ambulatory Visit: Payer: No Typology Code available for payment source

## 2016-10-16 NOTE — ED Notes (Signed)
EDP at bedside  

## 2016-10-16 NOTE — ED Notes (Signed)
Pt returned from MRI °

## 2016-10-16 NOTE — Discharge Instructions (Signed)
Your MRI today was normal-- no evidence of acute stroke. You may follow-up with outpatient neurology clinic, can call in the morning to schedule an appointment. I printed out copies of your labs and imaging studies from today for physician review. Recommend to follow-up closely with her primary care doctor as well. Return here for any new or worsening symptoms.

## 2016-10-16 NOTE — ED Notes (Signed)
Pt in MRI.

## 2016-10-16 NOTE — Progress Notes (Signed)
Amber Franco came to ED via private vehicle with Left side weakness and aphasic. LKW 1900, CBG 107, NIH 11. Pt had more of a stuttering pattern to her speech not consistent with aphasia. MRI negative. ED MD to determine if pt should be admitted to d/c home.

## 2016-10-18 ENCOUNTER — Ambulatory Visit: Payer: No Typology Code available for payment source

## 2016-10-30 ENCOUNTER — Encounter: Payer: Self-pay | Admitting: Internal Medicine

## 2016-10-30 ENCOUNTER — Ambulatory Visit (INDEPENDENT_AMBULATORY_CARE_PROVIDER_SITE_OTHER): Payer: No Typology Code available for payment source | Admitting: Internal Medicine

## 2016-10-30 VITALS — BP 152/86 | HR 86 | Temp 98.1°F | Wt 197.7 lb

## 2016-10-30 DIAGNOSIS — G43909 Migraine, unspecified, not intractable, without status migrainosus: Secondary | ICD-10-CM

## 2016-10-30 DIAGNOSIS — G894 Chronic pain syndrome: Secondary | ICD-10-CM

## 2016-10-30 DIAGNOSIS — F17210 Nicotine dependence, cigarettes, uncomplicated: Secondary | ICD-10-CM

## 2016-10-30 DIAGNOSIS — I10 Essential (primary) hypertension: Secondary | ICD-10-CM

## 2016-10-30 DIAGNOSIS — F329 Major depressive disorder, single episode, unspecified: Secondary | ICD-10-CM | POA: Insufficient documentation

## 2016-10-30 DIAGNOSIS — M797 Fibromyalgia: Secondary | ICD-10-CM | POA: Insufficient documentation

## 2016-10-30 DIAGNOSIS — F339 Major depressive disorder, recurrent, unspecified: Secondary | ICD-10-CM

## 2016-10-30 DIAGNOSIS — F419 Anxiety disorder, unspecified: Secondary | ICD-10-CM

## 2016-10-30 DIAGNOSIS — R202 Paresthesia of skin: Secondary | ICD-10-CM

## 2016-10-30 DIAGNOSIS — F32A Depression, unspecified: Secondary | ICD-10-CM | POA: Insufficient documentation

## 2016-10-30 MED ORDER — FLUOXETINE HCL 20 MG PO CAPS: 20.0000 mg | ORAL_CAPSULE | Freq: Every day | ORAL | 2 refills | Status: DC

## 2016-10-30 MED FILL — FLUoxetine HCL 20 MG CAPS: 20 | 30 days supply | Qty: 30 | Fill #0

## 2016-10-30 NOTE — Assessment & Plan Note (Signed)
Blood pressure is uncontrolled today, 152/86 however patient is fasting for religious reasons and did not take her medication today. -- Continue atenolol 50 mg daily  -- F/u next visit in 6 weeks

## 2016-10-30 NOTE — Assessment & Plan Note (Addendum)
Patient has chronic recurring symptoms of anxiety and depression. She was started on citalopram back in July of 2017 with significant improvement in her mood. She stopped this medication after 3 months because felt back to her baseline. Today, she is very tearful on exam and again endorsing depressed mood and anxiety. She is also endorsing chronic fatigue, weight gain, and anhedonia. She had multiple family members passed away around this time last year and she reports this is a difficult time for her. Given her concurrent chronic pain syndrome and migraines, I am concerned for fibromyalgia (see chronic pain A&P). We discussed this possible diagnosis. She is agreeable to a trial of fluoxetine. I instructed patient to take this medication daily and advised that medication may take 4-6 weeks to take effect.  -- Start fluoxetine 20 mg daily  -- F/u 4-6 weeks

## 2016-10-30 NOTE — Progress Notes (Signed)
   CC: Depression  HPI:  [name] is a 46 y.o. female with past medical history outlined below here for follow up of her depression. For the details of today's visit, please refer to the assessment and plan.  Past Medical History:  Diagnosis Date  . Allergy   . Hypertension   . Sickle cell trait ([place])   . Stroke [facility])     Review of Systems:  All pertinents listed in HPI, otherwise negative  Physical Exam:  Vitals:   10/30/16 1531  BP: (!) 152/86  Pulse: 86  Temp: 98.1 F (36.7 C)  TempSrc: Oral  SpO2: 100%  Weight: 197 lb 11.2 oz (89.7 kg)    Constitutional: NAD, appears comfortable Cardiovascular: RRR, no murmurs, rubs, or gallops.  Pulmonary/Chest: CTAB, no wheezes, rales, or rhonchi. Abdominal: Soft, non tender, non distended. +BS.  Extremities: Warm and well perfused. No edema.  Neurological: A&Ox3, CN II - XII grossly intact.  Psychiatric: Tearful, depressed mood   Assessment & Plan:   See Encounters Tab for problem based charting.  Patient discussed with Dr. Dareen Piano

## 2016-10-30 NOTE — Assessment & Plan Note (Addendum)
Patient has chronic diffuse pain that is most consistent with fibromyalgia. She has chronic pain in her back, left arm, and left leg as well as symptoms of unilateral left sided paresthesias that she describes as "pins and needles" sensation. Extensive work up thus far has failed to reveal any findings to explain her symptoms (MRI cervial/thoracic/lumbar spine, TSH, RPR, vit B12, HIV, A1c). She was referred to physical therapy at her last visit which has provided significant relief. She wishes to continue with PT. Given her concurrent symptoms of anxiety, depression, and migraines, I believe that her clinic pictures is most consistent with fibromyalgia. We discussed the possibility of this diagnosis today, and patient seemed relieved to have a diagnosis as she has been living with these symptoms for many years. We discussed standard therapies for fibromyalgia. She is agreeable to a trial of fluoxetine and will continue physical therapy for her diffuse pain. I provided her with patient information handouts on fibromyalgia.  -- F/u 6 weeks -- Continue PT -- Start fluoxetine 20 mg daily

## 2016-10-30 NOTE — Patient Instructions (Signed)
Amber Franco,  It was a pleasure seeing you today. I am sorry you have been having a rough time lately. I have sent a prescription for fluoxetine (aka Prozac) to your pharmacy. Please take this medicine every day. I am hopeful this will help with your mood as well as your pain and possibly even for weight loss. Please continue taking your other medications as previously prescribed. I will place another referral for physical therapy. I am glad you are finding this helpful! It will be important for you to continue these exercises on your own in the future. Please follow up with me in 6 weeks. If you have any questions or concerns, call our clinic at [phone] or after hours call [phone] and ask for the internal medicine resident on call. Thank you!  - Dr. Philipp Ovens    Myofascial Pain Syndrome and Fibromyalgia Myofascial pain syndrome and fibromyalgia are both pain disorders. This pain may be felt mainly in your muscles.  Myofascial pain syndrome:  Always has trigger points or tender points in the muscle that will cause pain when pressed. The pain may come and go.  Usually affects your neck, upper back, and shoulder areas. The pain often radiates into your arms and hands.  Fibromyalgia:  Has muscle pains and tenderness that come and go.  Is often associated with fatigue and sleep disturbances.  Has trigger points.  Tends to be long-lasting (chronic), but is not life-threatening. Fibromyalgia and myofascial pain are not the same. However, they often occur together. If you have both conditions, each can make the other worse. Both are common and can cause enough pain and fatigue to make day-to-day activities difficult. What are the causes? The exact causes of fibromyalgia and myofascial pain are not known. People with certain gene types may be more likely to develop fibromyalgia. Some factors can be triggers for both conditions, such as:  Spine disorders.  Arthritis.  Severe injury  (trauma) and other physical stressors.  Being under a lot of stress.  A medical illness. What are the signs or symptoms? Fibromyalgia  The main symptom of fibromyalgia is widespread pain and tenderness in your muscles. This can vary over time. Pain is sometimes described as stabbing, shooting, or burning. You may have tingling or numbness, too. You may also have sleep problems and fatigue. You may wake up feeling tired and groggy (fibro fog). Other symptoms may include:  Bowel and bladder problems.  Headaches.  Visual problems.  Problems with odors and noises.  Depression or mood changes.  Painful menstrual periods (dysmenorrhea).  Dry skin or eyes. Myofascial pain syndrome  Symptoms of myofascial pain syndrome include:  Tight, ropy bands of muscle.  Uncomfortable sensations in muscular areas, such as:  Aching.  Cramping.  Burning.  Numbness.  Tingling.  Muscle weakness.  Trouble moving certain muscles freely (range of motion). How is this diagnosed? There are no specific tests to diagnose fibromyalgia or myofascial pain syndrome. Both can be hard to diagnose because their symptoms are common in many other conditions. Your health care provider may suspect one or both of these conditions based on your symptoms and medical history. Your health care provider will also do a physical exam. The key to diagnosing fibromyalgia is having pain, fatigue, and other symptoms for more than three months that cannot be explained by another condition. The key to diagnosing myofascial pain syndrome is finding trigger points in muscles that are tender and cause pain elsewhere in your body (referred pain). How is  this treated? Treating fibromyalgia and myofascial pain often requires a team of health care providers. This usually starts with your primary provider and a physical therapist. You may also find it helpful to work with alternative health care providers, such as massage therapists  or acupuncturists. Treatment for fibromyalgia may include medicines. This may include nonsteroidal anti-inflammatory drugs (NSAIDs), along with other medicines. Treatment for myofascial pain may also include:  NSAIDs.  Cooling and stretching of muscles.  Trigger point injections.  Sound wave (ultrasound) treatments to stimulate muscles. Follow these instructions at home:  Take medicines only as directed by your health care provider.  Exercise as directed by your health care provider or physical therapist.  Try to avoid stressful situations.  Practice relaxation techniques to control your stress. You may want to try:  Biofeedback.  Visual imagery.  Hypnosis.  Muscle relaxation.  Yoga.  Meditation.  Talk to your health care provider about alternative treatments, such as acupuncture or massage treatment.  Maintain a healthy lifestyle. This includes eating a healthy diet and getting enough sleep.  Consider joining a support group.  Do not do activities that stress or strain your muscles. That includes repetitive motions and heavy lifting. Where to find more information:  National Fibromyalgia Association: www.fmaware.[place]: www.arthritis.org  American Chronic Pain Association: OEMDeals.dk Contact a health care provider if:  You have new symptoms.  Your symptoms get worse.  You have side effects from your medicines.  You have trouble sleeping.  Your condition is causing depression or anxiety. This information is not intended to replace advice given to you by your health care provider. Make sure you discuss any questions you have with your health care provider. Document Released: 06/17/2005 Document Revised: 11/23/2015 Document Reviewed: 03/23/2014 Elsevier Interactive Patient Education  2017 [employer].

## 2016-10-31 NOTE — Progress Notes (Signed)
Internal Medicine Clinic Attending  Case discussed with Dr. Guilloud at the time of the visit.  We reviewed the resident's history and exam and pertinent patient test results.  I agree with the assessment, diagnosis, and plan of care documented in the resident's note.  

## 2016-11-20 ENCOUNTER — Encounter: Payer: Self-pay | Admitting: *Deleted

## 2016-11-26 ENCOUNTER — Encounter (HOSPITAL_COMMUNITY): Payer: Self-pay

## 2016-11-26 ENCOUNTER — Ambulatory Visit
Payer: No Typology Code available for payment source | Admitting: Student in an Organized Health Care Education/Training Program

## 2016-11-26 ENCOUNTER — Telehealth: Payer: Self-pay

## 2016-11-26 ENCOUNTER — Emergency Department (HOSPITAL_COMMUNITY): Payer: Self-pay

## 2016-11-26 ENCOUNTER — Emergency Department (HOSPITAL_COMMUNITY)
Admission: EM | Admit: 2016-11-26 | Discharge: 2016-11-26 | Disposition: A | Discharge status: Home / Self Care | Payer: Self-pay | Attending: Emergency Medicine | Admitting: Emergency Medicine

## 2016-11-26 DIAGNOSIS — R42 Dizziness and giddiness: Secondary | ICD-10-CM | POA: Insufficient documentation

## 2016-11-26 DIAGNOSIS — F17210 Nicotine dependence, cigarettes, uncomplicated: Secondary | ICD-10-CM | POA: Insufficient documentation

## 2016-11-26 DIAGNOSIS — Z8673 Personal history of transient ischemic attack (TIA), and cerebral infarction without residual deficits: Secondary | ICD-10-CM | POA: Insufficient documentation

## 2016-11-26 DIAGNOSIS — I639 Cerebral infarction, unspecified: Secondary | ICD-10-CM

## 2016-11-26 DIAGNOSIS — I10 Essential (primary) hypertension: Secondary | ICD-10-CM | POA: Insufficient documentation

## 2016-11-26 DIAGNOSIS — Z79899 Other long term (current) drug therapy: Secondary | ICD-10-CM | POA: Insufficient documentation

## 2016-11-26 DIAGNOSIS — Z7982 Long term (current) use of aspirin: Secondary | ICD-10-CM | POA: Insufficient documentation

## 2016-11-26 DIAGNOSIS — Z5181 Encounter for therapeutic drug level monitoring: Secondary | ICD-10-CM | POA: Insufficient documentation

## 2016-11-26 LAB — COMPREHENSIVE METABOLIC PANEL
ALT: 19 U/L (ref 14–54)
ANION GAP: 10 (ref 5–15)
AST: 21 U/L (ref 15–41)
Albumin: 3.9 g/dL (ref 3.5–5.0)
Alkaline Phosphatase: 54 U/L (ref 38–126)
BILIRUBIN TOTAL: 0.8 mg/dL (ref 0.3–1.2)
BUN: 7 mg/dL (ref 6–20)
CALCIUM: 9.3 mg/dL (ref 8.9–10.3)
CO2: 22 mmol/L (ref 22–32)
CREATININE: 0.73 mg/dL (ref 0.44–1.00)
Chloride: 106 mmol/L (ref 101–111)
GFR calc non Af Amer: >60 mL/min (ref >60)
GLUCOSE: 128 mg/dL — AB (ref 65–99)
Potassium: 3.5 mmol/L (ref 3.5–5.1)
Sodium: 138 mmol/L (ref 135–145)
TOTAL PROTEIN: 7.2 g/dL (ref 6.5–8.1)

## 2016-11-26 LAB — DIFFERENTIAL
Basophils Absolute: 0 10*3/uL (ref 0.0–0.1)
Basophils Relative: 0 %
EOS PCT: 3 %
Eosinophils Absolute: 0.2 10*3/uL (ref 0.0–0.7)
LYMPHS ABS: 2.7 10*3/uL (ref 0.7–4.0)
Lymphocytes Relative: 31 %
MONO ABS: 0.4 10*3/uL (ref 0.1–1.0)
Monocytes Relative: 4 %
Neutro Abs: 5.3 10*3/uL (ref 1.7–7.7)
Neutrophils Relative %: 62 %

## 2016-11-26 LAB — APTT: aPTT: 31 seconds (ref 24–36)

## 2016-11-26 LAB — CBG MONITORING, ED: Glucose-Capillary: 119 mg/dL — ABNORMAL HIGH (ref 65–99)

## 2016-11-26 LAB — CBC
HEMATOCRIT: 40.5 % (ref 36.0–46.0)
HEMOGLOBIN: 14 g/dL (ref 12.0–15.0)
MCH: 29.9 pg (ref 26.0–34.0)
MCHC: 34.6 g/dL (ref 30.0–36.0)
MCV: 86.4 fL (ref 78.0–100.0)
Platelets: 299 10*3/uL (ref 150–400)
RBC: 4.69 MIL/uL (ref 3.87–5.11)
RDW: 12.2 % (ref 11.5–15.5)
WBC: 8.6 10*3/uL (ref 4.0–10.5)

## 2016-11-26 LAB — I-STAT CHEM 8, ED
BUN: 7 mg/dL (ref 6–20)
CALCIUM ION: 1.15 mmol/L (ref 1.15–1.40)
Chloride: 106 mmol/L (ref 101–111)
Creatinine, Ser: 0.7 mg/dL (ref 0.44–1.00)
GLUCOSE: 125 mg/dL — AB (ref 65–99)
HCT: 42 % (ref 36.0–46.0)
Hemoglobin: 14.3 g/dL (ref 12.0–15.0)
Potassium: 3.5 mmol/L (ref 3.5–5.1)
SODIUM: 141 mmol/L (ref 135–145)
TCO2: 23 mmol/L (ref 0–100)

## 2016-11-26 LAB — PROTIME-INR
INR: 1.03
Prothrombin Time: 13.5 seconds (ref 11.4–15.2)

## 2016-11-26 LAB — I-STAT TROPONIN, ED: Troponin i, poc: 0 ng/mL (ref 0.00–0.08)

## 2016-11-26 MED ORDER — LORAZEPAM 2 MG/ML IJ SOLN
1.0000 mg | Freq: Once | INTRAMUSCULAR | Status: AC
  Administered 2016-11-26: 1 mg via INTRAVENOUS
  Filled 2016-11-26: qty 1

## 2016-11-26 MED ORDER — LORAZEPAM 1 MG PO TABS: 0.5000 mg | ORAL_TABLET | Freq: Three times a day (TID) | ORAL | 0 refills | Status: DC | PRN

## 2016-11-26 MED ORDER — LORAZEPAM 2 MG/ML IJ SOLN: 1.0000 mg | INTRAMUSCULAR | Status: DC | PRN

## 2016-11-26 NOTE — Telephone Encounter (Signed)
Pt calls and states she has been having dizziness and weakness since yesterday, this happens when she raises up from a resting positon or turns over on her sides. It makes her nauseous but no vomiting noted. Per doriss. appt 1445 today

## 2016-11-26 NOTE — ED Notes (Signed)
cbg was 119

## 2016-11-26 NOTE — ED Notes (Signed)
Dizziness is intermittent. States that she feels like the room is spinning when she feels dizzy.

## 2016-11-26 NOTE — ED Provider Notes (Signed)
[facility] DEPT Provider Note   CSN: [id number] Arrival date & time: 11/26/16  1341     History   Chief Complaint Chief Complaint  Patient presents with  . Medication Reaction    HPI Amber Franco is a 46 y.o. female presents with sudden onset vertigo-like symptoms while lying down last night. She explains that she turned her head to the side and the entire room started spinning all around her. She called her PCP because she thought it may be related to her new medication started Paxil 3 weeks ago. She was coming into an appointment downstairs to see her PCP but started having an episode after using the elevator and she was sent up to the emergency department. She also endorses amplification of sounds and nausea and the episodes are intermittent. She denies any pain, loss of hearing, tinnitus weakness, numbness or other symptoms.  HPI  Past Medical History:  Diagnosis Date  . Allergy   . Hypertension   . Sickle cell trait ([place])   . Stroke [facility])     Patient Active Problem List   Diagnosis Date Noted  . Depression 10/30/2016  . Chronic pain syndrome 10/30/2016  . Neuropathy 08/05/2016  . Polyarthritis 04/16/2016  . Generalized anxiety disorder 01/22/2016  . Hypertension 10/23/2015  . Tobacco abuse 10/23/2015  . Spasm of muscle, back 10/23/2015  . Health care maintenance 10/23/2015    History reviewed. No pertinent surgical history.  OB History    No data available       Home Medications    Prior to Admission medications   Medication Sig Start Date End Date Taking? Authorizing Provider  aspirin 81 MG chewable tablet Chew 81 mg by mouth daily.   Yes [provider]  atenolol (TENORMIN) 50 MG tablet Take 1 tablet (50 mg total) by mouth daily. 08/05/16  Yes Velna Ochs, MD  cyclobenzaprine (FLEXERIL) 10 MG tablet TAKE 1 TABLET(10 MG) BY MOUTH AT BEDTIME AS NEEDED FOR MUSCLE SPASMS 09/11/16  Yes Velna Ochs, MD  diclofenac sodium (VOLTAREN) 1 % GEL  Apply 4 g topically 4 (four) times daily. 08/05/16  Yes Velna Ochs, MD  diphenhydrAMINE (BENADRYL) 2 % cream Apply 1 application topically 3 (three) times daily as needed for itching.   Yes [provider]  FLUoxetine (PROZAC) 20 MG capsule Take 1 capsule (20 mg total) by mouth daily. 10/30/16  Yes Velna Ochs, MD  loratadine (CLARITIN) 10 MG tablet Take 10 mg by mouth daily as needed for allergies.   Yes [provider]  meloxicam (MOBIC) 15 MG tablet Take 7.5 mg by mouth daily as needed for pain.   Yes [provider]  gabapentin (NEURONTIN) 300 MG capsule Take 1 capsule (300 mg total) by mouth 3 (three) times daily. Patient not taking: Reported on 11/26/2016 08/05/16 08/05/17  Velna Ochs, MD  LORazepam (ATIVAN) 1 MG tablet Take 0.5 tablets (0.5 mg total) by mouth every 8 (eight) hours as needed for anxiety. 11/26/16   Emeline General, PA-C  meloxicam (MOBIC) 7.5 MG tablet Take 1 tablet (7.5 mg total) by mouth daily. Patient not taking: Reported on 11/26/2016 09/16/16 09/16/17  Velna Ochs, MD  sodium chloride (OCEAN) 0.65 % SOLN nasal spray Place 1 spray into both nostrils as needed for congestion. Patient not taking: Reported on 09/25/2016 10/23/15   Jones Bales, MD    Family History Family History  Problem Relation Age of Onset  . Cancer Mother   . Hypertension Father   . Stroke  Father     Social History Social History  Substance Use Topics  . Smoking status: Current Every Day Smoker    Packs/day: 0.25    Types: Cigarettes  . Smokeless tobacco: Never Used     Comment: 4 per day   . Alcohol use No     Allergies   Codeine; Hydrocodone; Strawberry (diagnostic); and Tape   Review of Systems Review of Systems  Constitutional: Negative for chills and fever.  HENT: Positive for ear discharge, postnasal drip, rhinorrhea and sneezing. Negative for congestion, ear pain, sore throat and tinnitus.   Eyes: Positive for visual  disturbance. Negative for photophobia, pain and redness.  Respiratory: Negative for cough, choking, chest tightness, shortness of breath, wheezing and stridor.   Cardiovascular: Negative for chest pain, palpitations and leg swelling.  Gastrointestinal: Positive for nausea. Negative for abdominal distention, abdominal pain, diarrhea and vomiting.  Genitourinary: Negative for dysuria and hematuria.  Musculoskeletal: Negative for arthralgias, back pain, gait problem, neck pain and neck stiffness.  Skin: Negative for color change, pallor and rash.  Neurological: Positive for dizziness. Negative for tremors, seizures, syncope, facial asymmetry, speech difficulty, weakness, light-headedness, numbness and headaches.     Physical Exam Updated Vital Signs BP 102/67   Pulse 82   Temp 98.2 F (36.8 C)   Resp (!) 22   SpO2 98%   Physical Exam  Constitutional: She is oriented to person, place, and time. She appears well-developed and well-nourished. No distress.  Afebrile, nontoxic-appearing, lying in bed in no acute distress.  HENT:  Head: Normocephalic and atraumatic.  Right Ear: External ear normal.  Left Ear: External ear normal.  Mouth/Throat: Oropharynx is clear and moist. No oropharyngeal exudate.  TM are non-erythematous or bulging, white scarring over both TM no discharge noted.  Eyes: Conjunctivae and EOM are normal. Pupils are equal, round, and reactive to light. Right eye exhibits no discharge. Left eye exhibits no discharge. No scleral icterus.  Pupils are 80mm, round, equal and reactive  Neck: Normal range of motion. Neck supple.  Cardiovascular: Normal rate, regular rhythm and normal heart sounds.   No murmur heard. Pulmonary/Chest: Effort normal and breath sounds normal. No respiratory distress.  Abdominal: She exhibits no distension. There is no guarding.  Musculoskeletal: Normal range of motion. She exhibits no edema or deformity.  Neurological: She is alert and oriented to  person, place, and time. No cranial nerve deficit or sensory deficit. She exhibits normal muscle tone. Coordination normal.  Neurologic Exam:   - Mental status: Patient is alert and cooperative. Fluent speech and words are clear. Coherent thought processes and insight is good. Patient is oriented x 4 to person, place, time and event.  - Cranial nerves:  CN III, IV, VI: pupils equally round, reactive to light both direct and conscensual and normal accommodation. Full extra-ocular movement. CN VII : muscles of facial expression intact. CN X :  midline uvula. XI strength of sternocleidomastoid and trapezius muscles 5/5, XII: tongue is midline when protruded. - Motor: No involuntary movements. Muscle tone and bulk normal throughout. Muscle strength is 5/5 in bilateral shoulder abduction, elbow flexion and extension, grip, hip extension, flexion, leg flexion and extension, ankle dorsiflexion and plantar flexion.  - Sensory: Proprioception, light tough sensation intact in all extremities.  - Cerebellar: rapid alternating movements and point to point movement intact in upper and lower extremities. Normal stance and gait.   Skin: Skin is warm and dry. No rash noted. She is not diaphoretic. No erythema. No  pallor.  Psychiatric: She has a normal mood and affect.  Nursing note and vitals reviewed.    ED Treatments / Results  Labs (all labs ordered are listed, but only abnormal results are displayed) Labs Reviewed  COMPREHENSIVE METABOLIC PANEL - Abnormal; Notable for the following:       Result Value   Glucose, Bld 128 (*)    All other components within normal limits  CBG MONITORING, ED - Abnormal; Notable for the following:    Glucose-Capillary 119 (*)    All other components within normal limits  I-STAT CHEM 8, ED - Abnormal; Notable for the following:    Glucose, Bld 125 (*)    All other components within normal limits  PROTIME-INR  APTT  CBC  DIFFERENTIAL  I-STAT TROPOININ, ED  CBG  MONITORING, ED    EKG  EKG Interpretation None       Radiology Mr Brain Wo Contrast  Result Date: 11/26/2016 CLINICAL DATA:  Initial evaluation for acute dizziness. EXAM: MRI HEAD WITHOUT CONTRAST TECHNIQUE: Multiplanar, multiecho pulse sequences of the brain and surrounding structures were obtained without intravenous contrast. COMPARISON:  Prior MRI from 10/15/2016. FINDINGS: Brain: Study mildly degraded by motion artifact. Cerebral volume within normal limits. No focal parenchymal signal abnormality. No significant cerebral white matter disease. No abnormal foci of restricted diffusion to suggest acute or subacute ischemia. No evidence for chronic infarction. No evidence for acute or chronic intracranial hemorrhage. No mass lesion, midline shift or mass effect. Ventricles normal size without evidence for hydrocephalus. No extra-axial fluid collection. Major dural sinuses are grossly patent. Pituitary gland suprasellar region within normal limits. Midline structures intact and normal. Vascular: Major intracranial vascular flow voids are well maintained. Skull and upper cervical spine: Craniocervical junction within normal limits. Visualized upper cervical spine unremarkable. Bone marrow signal intensity within normal limits. No scalp soft tissue abnormality. Sinuses/Orbits: Globes and orbital soft tissues within normal limits. Paranasal sinuses and mastoids are clear. Inner ear structures grossly normal. Other: None. IMPRESSION: Normal MRI of the brain.  No acute intracranial process identified. Electronically Signed   By: Jeannine Boga M.D.   On: 11/26/2016 19:24    Procedures Procedures (including critical care time)  Medications Ordered in ED Medications  LORazepam (ATIVAN) injection 1 mg (not administered)  LORazepam (ATIVAN) injection 1 mg (1 mg Intravenous Given 11/26/16 1736)     Initial Impression / Assessment and Plan / ED Course  I have reviewed the triage vital signs and  the nursing notes.  Pertinent labs & imaging results that were available during my care of the patient were reviewed by me and considered in my medical decision making (see chart for details).    Patient presents with vertigo like symptoms in the room spinning when she moves her head. She also endorses amplification of sounds and nausea.  Labs unremarkable Reassuring exam, normal neuro Normal MRI brain.  Patient was discussed with Dr. Eulis Foster who has seen patient and agrees with assessment and plan. He recommends benzo for symptomatic relief of vertigo.  On reassessment, patient reported significant improvement after 1mg  ativan. Complete resolution of symptoms. She was sitting up in bed watching TV. She walked around the room without difficulties. Normal stance and gait.  Will discharge home with symptomatic relief and close follow up with PCP.  Discussed strict return precautions and advised to return to the emergency department if experiencing any new or worsening symptoms. Instructions were understood and patient agreed with discharge plan. Final Clinical Impressions(s) / ED  Diagnoses   Final diagnoses:  Dizziness    New Prescriptions New Prescriptions   LORAZEPAM (ATIVAN) 1 MG TABLET    Take 0.5 tablets (0.5 mg total) by mouth every 8 (eight) hours as needed for anxiety.     Dossie Der 11/26/16 2057    Daleen Bo, MD 11/26/16 [phone]

## 2016-11-26 NOTE — ED Notes (Signed)
Gave pt Sprite, per Blanch Media - RN.

## 2016-11-26 NOTE — Telephone Encounter (Signed)
46 year old woman came to the [facility] today with severe vertigo that has been persistent since yesterday. She was crying out in the waiting room, very anxious, holding her head, saying that the room is spinning. She is severely symptomatic. She reports this has been constant for the last day, does not return to normal between episodes. Her eyes are deviated left and up, I didn't appreciate nystagmus but it was a limited exam.   Our nurses are taking her to the ED. This is clearly not BPPV because of time course. This is acute vestibular syndrome and CVA will need to be ruled out. I would suggest IV Diazepam for acute symptoms now. Likely will need an MRI to rule out posterior stroke.

## 2016-11-26 NOTE — ED Notes (Signed)
Mitchell at bedside

## 2016-11-26 NOTE — Discharge Instructions (Signed)
As discussed, follow up with your primary care provider in the morning. Continue paxil as prescribed. Do not discontinue this medication abruptly. Ativan as needed for vertigo symptoms. Do not take unless you have symptoms.  Return to the ER if symptoms worsen in the meantime.

## 2016-11-26 NOTE — ED Triage Notes (Signed)
Pt recently started taking Paxil X3 weeks ago. She began having some drainage from her ear this weekend and now is experiencing extreme dizziness. Pt is startled easily by noises and moans in pain. Pt alert and oriented.

## 2016-11-26 NOTE — ED Notes (Signed)
Patient transported to MRI 

## 2016-11-26 NOTE — ED Provider Notes (Signed)
  Face-to-face evaluation   History: She presents for evaluation of spinning sensation caused by eye and head movements, starting yesterday.  The discomfort is intermittent.  She denies fever, headache, weakness or paresthesia.  No similar problem in the past.  Physical exam: Alert, calm and interactive.  Heart regular rate and rhythm no murmur lungs clear to auscultation.  Neurologic-normal strength and sensation bilaterally.  No nystagmus.  Eye movement causes vertigo, which improves when she shuts her eyes.  Medical screening examination/treatment/procedure(s) were conducted as a shared visit with non-physician practitioner(s) and myself.  I personally evaluated the patient during the encounter    Daleen Bo, MD 11/26/16 2311

## 2016-11-26 NOTE — Telephone Encounter (Signed)
Needs to speak with a nurse about FLUoxetine (PROZAC) 20 MG capsule, refill request.

## 2016-12-02 ENCOUNTER — Encounter: Payer: No Typology Code available for payment source | Admitting: Internal Medicine

## 2016-12-24 NOTE — Addendum Note (Signed)
Addended by: Hulan Fray on: 12/24/2016 08:40 AM   Modules accepted: Orders

## 2017-01-13 ENCOUNTER — Encounter: Payer: Self-pay | Admitting: Internal Medicine

## 2017-01-13 ENCOUNTER — Ambulatory Visit (INDEPENDENT_AMBULATORY_CARE_PROVIDER_SITE_OTHER): Payer: No Typology Code available for payment source | Admitting: Internal Medicine

## 2017-01-13 VITALS — BP 126/93 | HR 103 | Temp 98.2°F | Ht 65.0 in | Wt 201.5 lb

## 2017-01-13 DIAGNOSIS — G629 Polyneuropathy, unspecified: Secondary | ICD-10-CM

## 2017-01-13 DIAGNOSIS — I10 Essential (primary) hypertension: Secondary | ICD-10-CM

## 2017-01-13 DIAGNOSIS — F17210 Nicotine dependence, cigarettes, uncomplicated: Secondary | ICD-10-CM

## 2017-01-13 DIAGNOSIS — M62830 Muscle spasm of back: Secondary | ICD-10-CM

## 2017-01-13 DIAGNOSIS — M797 Fibromyalgia: Secondary | ICD-10-CM

## 2017-01-13 DIAGNOSIS — Z720 Tobacco use: Secondary | ICD-10-CM

## 2017-01-13 DIAGNOSIS — G894 Chronic pain syndrome: Secondary | ICD-10-CM

## 2017-01-13 DIAGNOSIS — Z79899 Other long term (current) drug therapy: Secondary | ICD-10-CM

## 2017-01-13 MED ORDER — CYCLOBENZAPRINE HCL 10 MG PO TABS: ORAL_TABLET | ORAL | 2 refills | Status: DC

## 2017-01-13 MED ORDER — GABAPENTIN 300 MG PO CAPS: 300.0000 mg | ORAL_CAPSULE | Freq: Three times a day (TID) | ORAL | 3 refills | Status: DC

## 2017-01-13 MED ORDER — MELOXICAM 15 MG PO TABS: 7.5000 mg | ORAL_TABLET | Freq: Every day | ORAL | 1 refills | Status: DC | PRN

## 2017-01-13 NOTE — Assessment & Plan Note (Signed)
Refilled gabapentin 300 mg TID

## 2017-01-13 NOTE — Assessment & Plan Note (Addendum)
Patient has a chronic diffuse pain syndrome associated with anxiety, depression, and migraines. Clinical picture is most consistent with fibromyalgia. She has chronic pain in her back, left arm, and left leg as well as symptoms of unilateral left sided paresthesias that have been extensively worked up without any findings to explain her symptoms (MRI cervical/thoracic/lumbar spin, TSH, RPR, Vit B12, HIV, A1c). She was referred to physical therapy at a prior visit which provided significant relief. She continues do to exercises at home which she finds helpful. She was last seen in clinic on 10/31/16 at which point we discussed a fibromyalgia diagnosis and started fluoxetine 20 mg daily. Today, patient reports that she took the medicine for a few days but that it "made her crazy". She reports that she normally has three moods or personalities, and that this medicine made her feel like she only has one. She also endorsed worsening depression and feeling like she wanted to "cut herself", but those symptoms have resolved since stopping the medicine. She currently denies depression, SI, HI, audiovisual hallucinations, and thoughts of harming herself. We have tried other SSRIs in the past with other vague and nondescript side effects. She currently appears to be feeling well and pain is minimal. Will continue to monitor.  -- Stop fluoxetine  -- Continue home exercises  -- Follow up as needed -- Assess widespread pain index and symptom severity scale at next visit

## 2017-01-13 NOTE — Assessment & Plan Note (Signed)
Patient continues to smoke, but is down to 1 cigarette a day from 4 at her last visit. I provided support and encouragement. Patient is planning to try nicotine patches when she is ready to quit completely. Offered prescription today, patient declined.  -- Cessation encouraged

## 2017-01-13 NOTE — Assessment & Plan Note (Signed)
Patient has chronic back spasms for which she has been on flexeril for many years. I have discussed with her in the past my concern that chronic flexeril is unlikely to be effective, however she is convinced that it provides relief. She is resistant to the idea of tapering off flexeril. I will continue to address at future visits. She currently takes it QHS for relief at night while sleeping. -- Refilled flexeril 10 mg QHS prn

## 2017-01-13 NOTE — Patient Instructions (Signed)
Ms. Dickison,  It was a pleasure to see you today. Your blood pressure today was 126/93. Please stop taking your atenolol for now and we will monitor your blood pressure off medication. I have sent refills of your gabapentin, meloxicam, and flexeril to your pharmacy. Please follow up with me in 3 months or sooner if you need [place]. If you have any questions or concerns, call our clinic at [phone] or after hours call [phone] and ask for the internal medicine resident on call. Thank you!   - Dr. Philipp Ovens

## 2017-01-13 NOTE — Progress Notes (Signed)
   CC: Depression, chronic pain follow up  HPI:  [name] is a 46 y.o. female with past medical history outlined below here for depression and chronic pain follow up. For the details of today's visit, please refer to the assessment and plan.  Past Medical History:  Diagnosis Date  . Allergy   . Hypertension   . Sickle cell trait ([place])   . Stroke [facility])     Review of Systems:  Denies SI, HI, and AVH.   Physical Exam:  Vitals:   01/13/17 1322  BP: (!) 126/93  Pulse: (!) 103  Temp: 98.2 F (36.8 C)  TempSrc: Oral  SpO2: 99%  Weight: 201 lb 8 oz (91.4 kg)  Height: 5\' 5"  (1.651 m)    Constitutional: NAD, appears comfortable HEENT: Atraumatic, normocephalic. PERRL, anicteric sclera.  Cardiovascular: RRR, no murmurs, rubs, or gallops.  Pulmonary/Chest: CTAB, no wheezes, rales, or rhonchi.  Extremities: Warm and well perfused. No edema.  Neurological: A&Ox3, CN II - XII grossly intact.  Psychiatric: Normal mood and affect  Assessment & Plan:   See Encounters Tab for problem based charting.  Patient discussed with Dr. Daryll Drown

## 2017-01-13 NOTE — Assessment & Plan Note (Signed)
Patient reports she ran out of all of her medications one month ago and has not taken her blood pressure medicine. She was previously controlled on atenolol 50 mg daily (chosen due to side effects with other medications). Blood pressure today is 126/93. Today we discussed monitoring off antihypertensive therapy and patient has agreed.  -- Follow up 3 months for recheck, if uncontrolled, consider restarting atenolol

## 2017-01-15 NOTE — Progress Notes (Signed)
Internal Medicine Clinic Attending  Case discussed with Dr. Guilloud at the time of the visit.  We reviewed the resident's history and exam and pertinent patient test results.  I agree with the assessment, diagnosis, and plan of care documented in the resident's note.  

## 2017-02-04 NOTE — Therapy (Signed)
[facility] [address] [place], [place], [address] Phone: [phone]   Fax:  [phone]  Patient Details  Name: Amber Franco MRN: [id number] Date of Birth: 11/25/1970 Referring Provider:  No ref. provider found  Encounter Date: 02/04/2017   PHYSICAL THERAPY DISCHARGE SUMMARY  Visits from Start of Care: 2  Current functional level related to goals / functional outcomes:     PT Long Term Goals - 09/25/16 1230      PT LONG TERM GOAL #1   Title Pt will be IND in HEP to improve posture, strength, and pain. TARGET DATE FOR ALL LTGS: 10/23/16   Status New     PT LONG TERM GOAL #2   Title Pt will report pain </=8/10 during movement in order to perform ADLs.    Status New        Remaining deficits: Unknown, as pt did not return since last visit.   Education / Equipment: HEP  Plan: Patient agrees to discharge.  Patient goals were not met. Patient is being discharged due to not returning since the last visit.  ?????        Maleeya Peterkin L 02/04/2017, 2:50 PM  [facility] [address] [place], [place], [address] Phone: [phone]   Fax:  [phone]   Geoffry Paradise, PT,DPT 02/04/17 2:50 PM Phone: [phone] Fax: [phone]

## 2017-03-09 ENCOUNTER — Encounter (HOSPITAL_COMMUNITY): Payer: Self-pay | Admitting: *Deleted

## 2017-03-09 ENCOUNTER — Ambulatory Visit (HOSPITAL_COMMUNITY)
Admission: EM | Admit: 2017-03-09 | Discharge: 2017-03-09 | Disposition: A | Discharge status: Home / Self Care | Payer: No Typology Code available for payment source | Attending: Family Medicine | Admitting: Family Medicine

## 2017-03-09 DIAGNOSIS — M25512 Pain in left shoulder: Secondary | ICD-10-CM

## 2017-03-09 DIAGNOSIS — M542 Cervicalgia: Secondary | ICD-10-CM

## 2017-03-09 DIAGNOSIS — M25511 Pain in right shoulder: Secondary | ICD-10-CM

## 2017-03-09 DIAGNOSIS — M549 Dorsalgia, unspecified: Secondary | ICD-10-CM

## 2017-03-09 MED ORDER — KETOROLAC TROMETHAMINE 30 MG/ML IJ SOLN
INTRAMUSCULAR | Status: AC
  Filled 2017-03-09: qty 1

## 2017-03-09 MED ORDER — CYCLOBENZAPRINE HCL 10 MG PO TABS: 10.0000 mg | ORAL_TABLET | Freq: Two times a day (BID) | ORAL | 0 refills | Status: DC | PRN

## 2017-03-09 MED ORDER — KETOROLAC TROMETHAMINE 30 MG/ML IJ SOLN
30.0000 mg | Freq: Once | INTRAMUSCULAR | Status: AC
  Administered 2017-03-09: 30 mg via INTRAMUSCULAR

## 2017-03-09 MED ORDER — DICLOFENAC SODIUM 75 MG PO TBEC: 75.0000 mg | DELAYED_RELEASE_TABLET | Freq: Two times a day (BID) | ORAL | 0 refills | Status: DC

## 2017-03-09 NOTE — Discharge Instructions (Signed)
Heat (pad or rice pillow in microwave) over affected area, 10-15 minutes every 2-3 hours while awake.   Ice/cold pack over area for 10-15 min every 2-3 hours while awake.  Don't take diclofenac (Voltaren) for the rest of the day.  Take the muscle relaxant cyclobenzaprine (Flexeril) around 2 hours before bed. Don't take during the day if it makes you drowsy.   Yoga may be beneficial for your poor flexibility. YouTube has good videos if you are looking for a place to start.
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
## 2017-03-19 ENCOUNTER — Ambulatory Visit: Payer: Self-pay

## 2017-04-14 ENCOUNTER — Encounter: Payer: Self-pay | Admitting: Internal Medicine

## 2017-04-14 ENCOUNTER — Ambulatory Visit (INDEPENDENT_AMBULATORY_CARE_PROVIDER_SITE_OTHER): Payer: Self-pay | Admitting: Internal Medicine

## 2017-04-14 VITALS — BP 139/92 | HR 98 | Temp 97.6°F | Wt 200.0 lb

## 2017-04-14 DIAGNOSIS — I10 Essential (primary) hypertension: Secondary | ICD-10-CM

## 2017-04-14 DIAGNOSIS — Z0000 Encounter for general adult medical examination without abnormal findings: Secondary | ICD-10-CM

## 2017-04-14 DIAGNOSIS — J309 Allergic rhinitis, unspecified: Secondary | ICD-10-CM

## 2017-04-14 DIAGNOSIS — M797 Fibromyalgia: Secondary | ICD-10-CM

## 2017-04-14 MED ORDER — FLUTICASONE PROPIONATE 50 MCG/ACT NA SUSP: 2.0000 | Freq: Every day | NASAL | 2 refills | Status: DC

## 2017-04-14 MED ORDER — ATENOLOL 50 MG PO TABS: 50.0000 mg | ORAL_TABLET | Freq: Every day | ORAL | 5 refills | Status: DC

## 2017-04-14 MED ORDER — CYCLOBENZAPRINE HCL 10 MG PO TABS: 10.0000 mg | ORAL_TABLET | Freq: Two times a day (BID) | ORAL | 0 refills | Status: DC | PRN

## 2017-04-14 MED ORDER — IBUPROFEN 600 MG PO TABS: 600.0000 mg | ORAL_TABLET | Freq: Four times a day (QID) | ORAL | 2 refills | Status: DC | PRN

## 2017-04-14 NOTE — Assessment & Plan Note (Signed)
Patient has a chronic diffuse pain syndrome associated with anxiety, depression, and migraines. Overall her clinical picture is most consistent with fibromyalgia. At her prior visit, she was referred to physical therapy which she found extremely beneficial. She has continued to perform exercises at home and most recently has started an online yoga course. She reports improvement in her leg strength and pain. She no longer has pain in her legs and reports only moderate pain in her upper and lower back and buttock that is tolerable. We have tried multiple SSRIs in the past that were not tolerated due to a variety of side effects. Currently, pain is well controlled with yoga / stretching exercises and as needed flexeril. Patient is requesting pain medication for her flares. Advised OTC NSAIDs.  -- Refilled flexeril BID prn  -- Continue yoga  -- Ibuprofen PRN for flares

## 2017-04-14 NOTE — Patient Instructions (Signed)
Amber Franco,  It was a pleasure to see you. For your pain and muscle spasms, you may take ibuprofen 600 mg every 6 hours as needed. You may continue to take flexeril as previously prescribed. I have restarted your blood pressure medications. Please take this daily. Follow up with me in 6 months. If you have any questions or concerns, call our clinic at [phone] or after hours call [phone] and ask for the internal medicine resident on call. Thank you!  - Dr. Philipp Ovens

## 2017-04-14 NOTE — Assessment & Plan Note (Signed)
Declined flu shot

## 2017-04-14 NOTE — Progress Notes (Signed)
   CC: HTN, Chronic pain f/u  HPI:  [name] is a 46 y.o. female with past medical history outlined below here for HTN and chronic pain follow up. For the details of today's visit, please refer to the assessment and plan.  Past Medical History:  Diagnosis Date  . Allergy   . Hypertension   . Sickle cell trait ([place])   . Stroke [facility])     Review of Systems  Constitutional: Positive for malaise/fatigue.  Musculoskeletal: Positive for myalgias.  Neurological: Negative for weakness.    Physical Exam:  Vitals:   04/14/17 1315 04/14/17 1337  BP: (!) 143/97 (!) 139/92  Pulse: (!) 102 98  Temp: 97.6 F (36.4 C)   TempSrc: Oral   SpO2: 99%   Weight: 200 lb (90.7 kg)     Constitutional: NAD, appears comfortable Cardiovascular: RRR, no murmurs, rubs, or gallops.  Pulmonary/Chest: CTAB, no wheezes, rales, or rhonchi.  Extremities: Warm and well perfused. No edema.  Psychiatric: Normal mood and affect  Assessment & Plan:   See Encounters Tab for problem based charting.  Patient discussed with Dr. Lynnae January

## 2017-04-14 NOTE — Assessment & Plan Note (Signed)
Patient endorses symptoms of nasal congestion and rhinorrhea. Reports taking OTC antihistamines as needed. -- Refilled flonase

## 2017-04-14 NOTE — Assessment & Plan Note (Signed)
Patient has a history of hypertension previously well controlled on atenolol 50 mg daily (chosen due to side effects with other medications). At her last follow-up appointment, patient had run out of her medications and had been off antihypertensives for one month. Given that her blood pressure was well controlled that time, 126/93, the decision was made to monitor off medication. Unfortunately today her BP  is elevated, 143/97 and 139/92 on repeat. She is agreeable to restarting atenolol.  -- Restart atenolol 50 mg daily

## 2017-04-15 NOTE — Progress Notes (Signed)
Internal Medicine Clinic Attending  Case discussed with Dr. Guilloud at the time of the visit.  We reviewed the resident's history and exam and pertinent patient test results.  I agree with the assessment, diagnosis, and plan of care documented in the resident's note.  

## 2017-05-12 ENCOUNTER — Ambulatory Visit (INDEPENDENT_AMBULATORY_CARE_PROVIDER_SITE_OTHER): Payer: Self-pay | Admitting: Internal Medicine

## 2017-05-12 ENCOUNTER — Other Ambulatory Visit: Payer: Self-pay

## 2017-05-12 ENCOUNTER — Encounter: Payer: Self-pay | Admitting: Internal Medicine

## 2017-05-12 DIAGNOSIS — M797 Fibromyalgia: Secondary | ICD-10-CM

## 2017-05-12 MED ORDER — DICLOFENAC SODIUM 75 MG PO TBEC: 75.0000 mg | DELAYED_RELEASE_TABLET | Freq: Every day | ORAL | 2 refills | Status: DC | PRN

## 2017-05-12 MED ORDER — CYCLOBENZAPRINE HCL 10 MG PO TABS
10.0000 mg | ORAL_TABLET | Freq: Two times a day (BID) | ORAL | 2 refills | Status: DC | PRN
Start: 2017-05-12 — End: 2017-08-21

## 2017-05-12 MED FILL — DICLOFENAC SODIUM 75 MG TAB: 75 | 30 days supply | Qty: 30 | Fill #0

## 2017-05-12 MED FILL — CYCLOBENZAPRINE 10 MG TAB: 10 | 30 days supply | Qty: 60 | Fill #0

## 2017-05-12 NOTE — Progress Notes (Signed)
   CC: Back pain   HPI:  Ms.Hortencia Better is a 46 y.o. female with past medical history outlined below here for back pain. For the details of today's visit, please refer to the assessment and plan.  Past Medical History:  Diagnosis Date  . Allergy   . Hypertension   . Sickle cell trait ([place])   . Stroke [facility])     Review of Systems  Musculoskeletal: Positive for back pain and neck pain. Negative for myalgias.    Physical Exam:  Vitals:   05/12/17 1452  BP: 139/90  Pulse: (!) 103  SpO2: 100%  Weight: 199 lb 8 oz (90.5 kg)  Height: 5\' 5"  (1.651 m)   Constitutional: NAD, appears comfortable Cardiovascular: RRR, no murmurs, rubs, or gallops.  Pulmonary/Chest: CTAB, no wheezes, rales, or rhonchi.  MSK: Diffuse bilateral neck and upper back pain tender to palpation with multiple trigger points  Psychiatric: Normal mood and affect  Assessment & Plan:   See Encounters Tab for problem based charting.  Patient discussed with Dr. Daryll Drown

## 2017-05-12 NOTE — Progress Notes (Signed)
Internal Medicine Clinic Attending  Case discussed with Dr. Guilloud at the time of the visit.  We reviewed the resident's history and exam and pertinent patient test results.  I agree with the assessment, diagnosis, and plan of care documented in the resident's note.  

## 2017-05-12 NOTE — Assessment & Plan Note (Signed)
Patient is here today with complaint of upper neck and back pain 1 week that she feels is consistent with a flare of her fibromyalgia. On exam she has diffuse muscular tenderness of her bilateral neck and upper back with multiple trigger points. There is no spinal tenderness; upper extremity strength is intact. She has a history of fibromyalgia previously well controlled with as needed Flexeril and stretching exercises. She reports frequent heavy lifting at work, and has been working overtime for the past week. Requesting a work note and refill of her Flexeril and diclofenac. Advised heating pads and Tylenol as needed for additional relief.  -- Refilled flexeril PRN -- Refilled diclofenac 75 mg daily as needed -- Tylenol as needed for additional relief  -- Heating pads -- Work note provided

## 2017-05-12 NOTE — Patient Instructions (Signed)
Ms. Menefee,  It was a pleasure to see you today. I am sorry to hear about your back pain. Please take diclofenac daily as needed and flexeril twice a day as needed. Continue to use heating pads when resting. You may use tylenol as needed for additional relief. If you have any questions or concerns, call our clinic at [phone] or after hours call [phone] and ask for the internal medicine resident on call. Thank you!  - Dr. Philipp Ovens

## 2017-07-09 ENCOUNTER — Encounter: Payer: Self-pay | Admitting: Internal Medicine

## 2017-07-09 ENCOUNTER — Other Ambulatory Visit: Payer: Self-pay

## 2017-07-09 ENCOUNTER — Ambulatory Visit (HOSPITAL_COMMUNITY)
Admission: RE | Admit: 2017-07-09 | Discharge: 2017-07-09 | Disposition: A | Discharge status: Home / Self Care | Payer: BLUE CROSS/BLUE SHIELD | Source: Ambulatory Visit | Attending: Internal Medicine | Admitting: Internal Medicine

## 2017-07-09 ENCOUNTER — Ambulatory Visit (INDEPENDENT_AMBULATORY_CARE_PROVIDER_SITE_OTHER): Payer: BLUE CROSS/BLUE SHIELD | Admitting: Internal Medicine

## 2017-07-09 DIAGNOSIS — M546 Pain in thoracic spine: Secondary | ICD-10-CM | POA: Diagnosis not present

## 2017-07-09 DIAGNOSIS — M797 Fibromyalgia: Secondary | ICD-10-CM | POA: Diagnosis not present

## 2017-07-09 MED ORDER — KETOROLAC TROMETHAMINE 60 MG/2ML IM SOLN: 60.0000 mg | Freq: Once | INTRAMUSCULAR | Status: DC

## 2017-07-09 MED ORDER — DICLOFENAC SODIUM 75 MG PO TBEC: 75.0000 mg | DELAYED_RELEASE_TABLET | Freq: Every day | ORAL | 2 refills | Status: DC | PRN

## 2017-07-09 MED ORDER — KETOROLAC TROMETHAMINE 30 MG/ML IJ SOLN
60.0000 mg | Freq: Once | INTRAMUSCULAR | Status: AC
  Administered 2017-07-09: 60 mg via INTRAMUSCULAR

## 2017-07-09 NOTE — Patient Instructions (Addendum)
Nice to meet you Amber Franco   We are going to get some x-rays of your back to make sure there was no compression that caused a slight break.  We will call you when the radiologist is able to reviewed the images if they see any abnormalities.  We will also give you a shot of pain medication here in the clinic and refill your Voltaren pain medication.  Take this every day to help decrease the inflammation and pain associated with your muscle injury.  He can also take Tylenol 1000 mg up to 4 times a day (every 6 hours).  It will also be important to alternate with heat and ice as well.  As you start to feel better, be sure to try to stretch and move as you are able to tolerate so, your muscles do not become more stiff take longer to heal.  With your underlying fibromyalgia it may certainly take some time to get back to 100%. If you start to have numbness/tingling or weakness in your arms/legs, be sure to call the clinic.

## 2017-07-09 NOTE — Progress Notes (Signed)
   CC: Back pain   HPI:  [name] is a 47 y.o. female with a past medical history of hypertension, sickle cell trait, allergies, fibromyalgia who presents to the clinic with complaints of back pain.  The back pain was sudden onset this morning around 11 AM after lifting a heavy object at work.  She is unsure the exact weight but it was a box of industrial batteries similar to car batteries.  Pain is constant, rated at 10/10.  It is worse at her upper back and between shoulder blades but extends down along the sides of her back to her lumbar region.  Her pain worsens with movement, she took 2 Advil at work which were ineffective.  She denies radiation of the pain to her legs or arms, extremity weakness, numbness/tingling, incontinence, or saddle anesthesia.  She states the muscle relaxers on her medication list do not help for her fibromyalgia.  Past Medical History:  Diagnosis Date  . Allergy   . Hypertension   . Sickle cell trait ([place])   . Stroke [facility])    Review of Systems:  Review of Systems  Musculoskeletal: Positive for back pain. Negative for falls.  Neurological: Negative for tingling, sensory change and focal weakness.     Physical Exam:  Vitals:   07/09/17 1417  BP: 140/90  Pulse: (!) 109  SpO2: 100%  Weight: 195 lb 14.4 oz (88.9 kg)   General: Sitting in chair uncomfortably, tearful  CV: Slightly tachycardic  MSK: No midline point tenderness, TTP of paraspinal musculature worst at thoracic region, ROM and further evaluation limited by pain Neuro: Alert and oriented x3, sensation to extremities intact, full strength with tolerated portions of exam, able to ambulate into clinic/room Skin: Warm, dry      Assessment & Plan:   See Encounters Tab for problem based charting.  Patient discussed with Dr. Evette Doffing

## 2017-07-10 NOTE — Progress Notes (Signed)
Internal Medicine Clinic Attending  Case discussed with Dr. Harden at the time of the visit.  We reviewed the resident's history and exam and pertinent patient test results.  I agree with the assessment, diagnosis, and plan of care documented in the resident's note.  

## 2017-07-10 NOTE — Assessment & Plan Note (Signed)
Patient presents with a muscular injury following heavy lifting at work with bilateral paraspinal back pain worse thoracic region, no neurologic symptoms or abnormalities on exam.  An acute fracture is unlikely but compression fracture with this mechanism of injury is a possibility and we will obtain thoracic x-rays to rule out.  For pain control, we refilled diclofenac tablets as patient noted this anti-inflammatory medicine has been effective in the past.  Also administered a dose of Toradol while in the clinic.  We will avoid opiates unless a underlying fracture is revealed.  Patient counseled on expectant management for course of injury, advised she can also use Tylenol, alternate with heating pad and ice, avoid heavy lifting and also bedrest.  With her underlying fibromyalgia and sensitivity to pain her recovery may be more prolonged than otherwise expected.  --Toradol IM in clinic --Diclofenac 75 mg daily refilled

## 2017-08-21 ENCOUNTER — Other Ambulatory Visit: Payer: Self-pay | Admitting: *Deleted

## 2017-08-21 DIAGNOSIS — G629 Polyneuropathy, unspecified: Secondary | ICD-10-CM

## 2017-08-21 DIAGNOSIS — M797 Fibromyalgia: Secondary | ICD-10-CM

## 2017-08-21 MED ORDER — GABAPENTIN 300 MG PO CAPS: 300.0000 mg | ORAL_CAPSULE | Freq: Three times a day (TID) | ORAL | 1 refills | Status: DC

## 2017-08-21 MED ORDER — CYCLOBENZAPRINE HCL 10 MG PO TABS: 10.0000 mg | ORAL_TABLET | Freq: Two times a day (BID) | ORAL | 0 refills | Status: DC | PRN

## 2017-08-21 NOTE — Telephone Encounter (Signed)
Pt states back pain flare-up; needs refills.

## 2017-08-21 NOTE — Telephone Encounter (Signed)
Approved refills. Please have her schedule a follow up appointment with me in the next couple of months, or my next available. Thanks.

## 2017-08-21 NOTE — Telephone Encounter (Signed)
Appt scheduled 3/18 with Dr Philipp Ovens.

## 2017-09-15 ENCOUNTER — Ambulatory Visit (INDEPENDENT_AMBULATORY_CARE_PROVIDER_SITE_OTHER): Payer: BLUE CROSS/BLUE SHIELD | Admitting: Internal Medicine

## 2017-09-15 ENCOUNTER — Encounter (INDEPENDENT_AMBULATORY_CARE_PROVIDER_SITE_OTHER): Payer: Self-pay

## 2017-09-15 ENCOUNTER — Encounter: Payer: Self-pay | Admitting: Internal Medicine

## 2017-09-15 DIAGNOSIS — J309 Allergic rhinitis, unspecified: Secondary | ICD-10-CM

## 2017-09-15 DIAGNOSIS — G629 Polyneuropathy, unspecified: Secondary | ICD-10-CM

## 2017-09-15 DIAGNOSIS — Z791 Long term (current) use of non-steroidal anti-inflammatories (NSAID): Secondary | ICD-10-CM

## 2017-09-15 DIAGNOSIS — Z79899 Other long term (current) drug therapy: Secondary | ICD-10-CM

## 2017-09-15 DIAGNOSIS — I10 Essential (primary) hypertension: Secondary | ICD-10-CM

## 2017-09-15 DIAGNOSIS — M797 Fibromyalgia: Secondary | ICD-10-CM | POA: Diagnosis not present

## 2017-09-15 MED ORDER — GABAPENTIN 300 MG PO CAPS
300.0000 mg | ORAL_CAPSULE | Freq: Three times a day (TID) | ORAL | 5 refills | Status: DC
Start: 2017-09-15 — End: 2018-07-29

## 2017-09-15 MED ORDER — LORATADINE 10 MG PO TABS: 10.0000 mg | ORAL_TABLET | Freq: Every day | ORAL | 5 refills | Status: DC | PRN

## 2017-09-15 MED ORDER — FLUTICASONE PROPIONATE 50 MCG/ACT NA SUSP: 2.0000 | Freq: Every day | NASAL | 5 refills | Status: DC

## 2017-09-15 MED ORDER — ATENOLOL 50 MG PO TABS: 50.0000 mg | ORAL_TABLET | Freq: Every day | ORAL | 5 refills | Status: DC

## 2017-09-15 MED ORDER — CYCLOBENZAPRINE HCL 10 MG PO TABS: 10.0000 mg | ORAL_TABLET | Freq: Two times a day (BID) | ORAL | 5 refills | Status: DC | PRN

## 2017-09-15 MED FILL — ATENOLOL 50 MG TABLET: 50 | 30 days supply | Qty: 30 | Fill #0

## 2017-09-15 MED FILL — GABAPENTIN 300 MG CAPSULE: 300 | 30 days supply | Qty: 90 | Fill #0

## 2017-09-15 MED FILL — CYCLOBENZAPRINE 10 MG TAB: 10 | 15 days supply | Qty: 30 | Fill #0

## 2017-09-15 MED FILL — FLUTICASONE PROP 50 MCG SPR: 50 | 30 days supply | Qty: 16 | Fill #0

## 2017-09-15 NOTE — Progress Notes (Signed)
Internal Medicine Clinic Attending  Case discussed with Dr. Guilloud at the time of the visit.  We reviewed the resident's history and exam and pertinent patient test results.  I agree with the assessment, diagnosis, and plan of care documented in the resident's note.  

## 2017-09-15 NOTE — Assessment & Plan Note (Signed)
Currently well controlled today on flexeril PRN, diclofenac PRN, gabapentin TID, heating pads as needed and stretching exercises. No muscle pain or back spasms today.  -- Continue current regimen  -- Refilled flexeril PRN -- Continue Diclofenac 75 daily PRN (instructed not to take with other NSAIDs) -- Refilled gabapentin 300 TID -- Heating pads -- Continue stretching exercises / yoga regularly

## 2017-09-15 NOTE — Patient Instructions (Signed)
Ms. Marines,  It was a pleasure to see you! I am glad you are doing well. Please continue to take all of your medicine as previously prescribed. I have sent refills to your pharamcy. You may follow up with me again in 6 months or sooner if needed. If you have any questions or concerns, call our clinic at [phone] or after hours call [phone] and ask for the internal medicine resident on call. Thank you!  - Dr. Philipp Ovens

## 2017-09-15 NOTE — Progress Notes (Signed)
   CC: Fibromyalgia f/u   HPI:  [name] is a 47 y.o. female with past medical history outlined below here for fibromyalgia f/u. For the details of today's visit, please refer to the assessment and plan.  Past Medical History:  Diagnosis Date  . Allergy   . Hypertension   . Sickle cell trait ([place])   . Stroke [facility])     Review of Systems  Musculoskeletal: Negative for back pain, myalgias and neck pain.     Physical Exam:  Vitals:   09/15/17 1540  BP: 127/84  Pulse: 93  Temp: 97.9 F (36.6 C)  TempSrc: Oral  SpO2: 99%  Weight: 197 lb 1.6 oz (89.4 kg)    Constitutional: NAD, appears comfortable Cardiovascular: RRR, no murmurs, rubs, or gallops.  Pulmonary/Chest: CTAB, no wheezes, rales, or rhonchi.  Extremities: Warm and well perfused. No edema.  Neurological: A&Ox3, CN II - XII grossly intact.  Psychiatric: Normal mood and affect  Assessment & Plan:   See Encounters Tab for problem based charting.  Patient discussed with Dr. Dareen Piano

## 2017-09-15 NOTE — Assessment & Plan Note (Signed)
Well controlled today,

## 2017-09-15 NOTE — Assessment & Plan Note (Signed)
Refilled gabapentin 300 mg TID

## 2017-09-15 NOTE — Assessment & Plan Note (Signed)
Refilled flonase and claritin.

## 2017-10-07 ENCOUNTER — Encounter (HOSPITAL_COMMUNITY): Payer: Self-pay

## 2017-10-07 ENCOUNTER — Emergency Department (HOSPITAL_COMMUNITY): Payer: BLUE CROSS/BLUE SHIELD

## 2017-10-07 ENCOUNTER — Emergency Department (HOSPITAL_COMMUNITY)
Admission: EM | Admit: 2017-10-07 | Discharge: 2017-10-07 | Disposition: A | Discharge status: Home / Self Care | Payer: BLUE CROSS/BLUE SHIELD | Attending: Emergency Medicine | Admitting: Emergency Medicine

## 2017-10-07 DIAGNOSIS — Z79899 Other long term (current) drug therapy: Secondary | ICD-10-CM | POA: Insufficient documentation

## 2017-10-07 DIAGNOSIS — I10 Essential (primary) hypertension: Secondary | ICD-10-CM | POA: Diagnosis not present

## 2017-10-07 DIAGNOSIS — R112 Nausea with vomiting, unspecified: Secondary | ICD-10-CM | POA: Diagnosis present

## 2017-10-07 DIAGNOSIS — F17210 Nicotine dependence, cigarettes, uncomplicated: Secondary | ICD-10-CM | POA: Diagnosis not present

## 2017-10-07 DIAGNOSIS — R197 Diarrhea, unspecified: Secondary | ICD-10-CM | POA: Insufficient documentation

## 2017-10-07 DIAGNOSIS — Z8673 Personal history of transient ischemic attack (TIA), and cerebral infarction without residual deficits: Secondary | ICD-10-CM | POA: Diagnosis not present

## 2017-10-07 DIAGNOSIS — R1031 Right lower quadrant pain: Secondary | ICD-10-CM | POA: Diagnosis not present

## 2017-10-07 DIAGNOSIS — K769 Liver disease, unspecified: Secondary | ICD-10-CM | POA: Insufficient documentation

## 2017-10-07 LAB — COMPREHENSIVE METABOLIC PANEL
ALT: 16 U/L (ref 14–54)
ANION GAP: 9 (ref 5–15)
AST: 15 U/L (ref 15–41)
Albumin: 3.9 g/dL (ref 3.5–5.0)
Alkaline Phosphatase: 45 U/L (ref 38–126)
BUN: 5 mg/dL — ABNORMAL LOW (ref 6–20)
CHLORIDE: 107 mmol/L (ref 101–111)
CO2: 22 mmol/L (ref 22–32)
Calcium: 9.6 mg/dL (ref 8.9–10.3)
Creatinine, Ser: 0.74 mg/dL (ref 0.44–1.00)
GFR calc non Af Amer: >60 mL/min (ref >60)
Glucose, Bld: 104 mg/dL — ABNORMAL HIGH (ref 65–99)
POTASSIUM: 3.9 mmol/L (ref 3.5–5.1)
SODIUM: 138 mmol/L (ref 135–145)
Total Bilirubin: 0.5 mg/dL (ref 0.3–1.2)
Total Protein: 6.7 g/dL (ref 6.5–8.1)

## 2017-10-07 LAB — URINALYSIS, ROUTINE W REFLEX MICROSCOPIC
Bilirubin Urine: NEGATIVE
Glucose, UA: NEGATIVE mg/dL
Hgb urine dipstick: NEGATIVE
KETONES UR: NEGATIVE mg/dL
Nitrite: NEGATIVE
PROTEIN: NEGATIVE mg/dL
Specific Gravity, Urine: 1.011 (ref 1.005–1.030)
pH: 7 (ref 5.0–8.0)

## 2017-10-07 LAB — LIPASE, BLOOD: LIPASE: 42 U/L (ref 11–51)

## 2017-10-07 LAB — CBC
HCT: 39.8 % (ref 36.0–46.0)
HEMOGLOBIN: 13.5 g/dL (ref 12.0–15.0)
MCH: 29.7 pg (ref 26.0–34.0)
MCHC: 33.9 g/dL (ref 30.0–36.0)
MCV: 87.5 fL (ref 78.0–100.0)
Platelets: 268 10*3/uL (ref 150–400)
RBC: 4.55 MIL/uL (ref 3.87–5.11)
RDW: 12 % (ref 11.5–15.5)
WBC: 8.8 10*3/uL (ref 4.0–10.5)

## 2017-10-07 LAB — I-STAT BETA HCG BLOOD, ED (MC, WL, AP ONLY)

## 2017-10-07 MED ORDER — SODIUM CHLORIDE 0.9 % IV BOLUS
1000.0000 mL | Freq: Once | INTRAVENOUS | Status: AC
Start: 2017-10-07 — End: 2017-10-07
  Administered 2017-10-07: 1000 mL via INTRAVENOUS

## 2017-10-07 MED ORDER — IOPAMIDOL (ISOVUE-300) INJECTION 61%
100.0000 mL | Freq: Once | INTRAVENOUS | Status: AC | PRN
  Administered 2017-10-07: 100 mL via INTRAVENOUS

## 2017-10-07 MED ORDER — METOCLOPRAMIDE HCL 5 MG/ML IJ SOLN
10.0000 mg | Freq: Once | INTRAMUSCULAR | Status: AC
  Administered 2017-10-07: 10 mg via INTRAVENOUS
  Filled 2017-10-07: qty 2

## 2017-10-07 MED ORDER — ONDANSETRON HCL 4 MG PO TABS: 4.0000 mg | ORAL_TABLET | Freq: Three times a day (TID) | ORAL | 0 refills | Status: DC | PRN

## 2017-10-07 MED ORDER — IOPAMIDOL (ISOVUE-300) INJECTION 61%
INTRAVENOUS | Status: AC
  Filled 2017-10-07: qty 100

## 2017-10-07 MED ORDER — MORPHINE SULFATE (PF) 4 MG/ML IV SOLN
4.0000 mg | Freq: Once | INTRAVENOUS | Status: AC
  Administered 2017-10-07: 4 mg via INTRAVENOUS
  Filled 2017-10-07: qty 1

## 2017-10-07 MED ORDER — ONDANSETRON HCL 4 MG/2ML IJ SOLN
4.0000 mg | Freq: Once | INTRAMUSCULAR | Status: AC
  Administered 2017-10-07: 4 mg via INTRAVENOUS
  Filled 2017-10-07: qty 2

## 2017-10-07 MED ORDER — ONDANSETRON 4 MG PO TBDP
4.0000 mg | ORAL_TABLET | Freq: Once | ORAL | Status: AC | PRN
  Administered 2017-10-07: 4 mg via ORAL
  Filled 2017-10-07: qty 1

## 2017-10-07 NOTE — Discharge Instructions (Signed)
Your CAT scan today shows an abnormality in your liver, please call your doctor and make them aware that you were seen in the emergency department, they will need to obtain records from the emergency department and schedule you for an outpatient MRI.  Do not hesitate to return to the emergency department for any new, worsening or concerning symptoms.  Push fluids: take small frequent sips of water or Gatorade, do not drink any soda, juice or caffeinated beverages.    Slowly resume solid diet as desired. Avoid food that are spicy, contain dairy and/or have high fat content.  Aviod NSAIDs (aspirin, motrin, ibuprofen, naproxen, Aleve et Ronney Asters) for pain control because they will irritate your stomach.

## 2017-10-07 NOTE — ED Notes (Signed)
Patient verbalized understanding of discharge instructions and denies any further needs or questions at this time. VS stable. Patient ambulatory with steady gait.  

## 2017-10-07 NOTE — ED Triage Notes (Addendum)
Patient complains of abdominal cramping with vomiting since early am, patient concerned that she has food poisoning.alert and oriented

## 2017-10-07 NOTE — ED Provider Notes (Signed)
[facility] EMERGENCY DEPARTMENT Provider Note   CSN: [id number] Arrival date & time: 10/07/17  9381     History   Chief Complaint Chief Complaint  Patient presents with  . Emesis  . Diarrhea    HPI Amber Franco is a 47 y.o. female with history of hypertension, stroke, sickle cell trait who presents with abdominal pain that began this morning.  Patient reports she was having some stomach pain when she first woke up and went right to the bathroom, but brushed it off.  She went through her normal routine had coffee and breakfast including sausage.  Soon following, when she got to work, she developed localized abdominal pain to the right lower abdomen, vomiting, and diarrhea.  She has had no fevers at home.  She reports abdominal cramping, but it feels differently than menstrual cramp.  She denies any recent travel or antibiotic use.  She reports that other people in her job has had similar symptoms recently.  She did not take any medications prior to arrival, other than her daily vitamin.  HPI  Past Medical History:  Diagnosis Date  . Allergy   . Hypertension   . Sickle cell trait ([place])   . Stroke [facility])     Patient Active Problem List   Diagnosis Date Noted  . Depression 10/30/2016  . Fibromyalgia 10/30/2016  . Neuropathy 08/05/2016  . Generalized anxiety disorder 01/22/2016  . Hypertension 10/23/2015  . Allergic rhinitis 10/23/2015  . Tobacco abuse 10/23/2015  . Spasm of muscle, back 10/23/2015  . Health care maintenance 10/23/2015    Past Surgical History:  Procedure Laterality Date  . TUBAL LIGATION       OB History   None      Home Medications    Prior to Admission medications   Medication Sig Start Date End Date Taking? Authorizing Provider  atenolol (TENORMIN) 50 MG tablet Take 1 tablet (50 mg total) by mouth daily. 09/15/17   Velna Ochs, MD  cyclobenzaprine (FLEXERIL) 10 MG tablet Take 1 tablet (10 mg total) by mouth 2 (two) times  daily as needed for muscle spasms. 09/15/17   Velna Ochs, MD  diclofenac (VOLTAREN) 75 MG EC tablet Take 1 tablet (75 mg total) by mouth daily as needed. 07/09/17   Tawny Asal, MD  fluticasone (FLONASE) 50 MCG/ACT nasal spray Place 2 sprays into both nostrils daily. 09/15/17 09/15/18  Velna Ochs, MD  gabapentin (NEURONTIN) 300 MG capsule Take 1 capsule (300 mg total) by mouth 3 (three) times daily. 09/15/17 09/15/18  Velna Ochs, MD  loratadine (CLARITIN) 10 MG tablet Take 1 tablet (10 mg total) by mouth daily as needed for allergies. 09/15/17   Velna Ochs, MD    Family History Family History  Problem Relation Age of Onset  . Cancer Mother   . Hypertension Father   . Stroke Father     Social History Social History   Tobacco Use  . Smoking status: Current Every Day Smoker    Packs/day: 0.25    Types: Cigarettes  . Smokeless tobacco: Never Used  . Tobacco comment: 2 per day . Increase x 1 month now back down  Substance Use Topics  . Alcohol use: No  . Drug use: No     Allergies   Codeine; Hydrocodone; Strawberry (diagnostic); and Tape   Review of Systems Review of Systems  Constitutional: Negative for chills and fever.  HENT: Negative for facial swelling and sore throat.   Respiratory: Negative for shortness of  breath.   Cardiovascular: Negative for chest pain.  Gastrointestinal: Positive for abdominal pain, diarrhea, nausea and vomiting. Negative for blood in stool.  Genitourinary: Negative for dysuria, frequency, vaginal bleeding and vaginal discharge.  Musculoskeletal: Negative for back pain.  Skin: Negative for rash and wound.  Neurological: Negative for headaches.  Psychiatric/Behavioral: The patient is not nervous/anxious.      Physical Exam Updated Vital Signs BP 130/80 (BP Location: Left Arm)   Pulse 80   Temp 98.4 F (36.9 C) (Oral)   Resp 18   SpO2 97%   Physical Exam  Constitutional: She appears well-developed and  well-nourished. No distress.  HENT:  Head: Normocephalic and atraumatic.  Mouth/Throat: Oropharynx is clear and moist. No oropharyngeal exudate.  Eyes: Pupils are equal, round, and reactive to light. Conjunctivae are normal. Right eye exhibits no discharge. Left eye exhibits no discharge. No scleral icterus.  Neck: Normal range of motion. Neck supple. No thyromegaly present.  Cardiovascular: Normal rate, regular rhythm, normal heart sounds and intact distal pulses. Exam reveals no gallop and no friction rub.  No murmur heard. Pulmonary/Chest: Effort normal and breath sounds normal. No stridor. No respiratory distress. She has no wheezes. She has no rales.  Abdominal: Soft. Bowel sounds are normal. She exhibits no distension. There is tenderness in the right lower quadrant. There is tenderness at McBurney's point. There is no rebound, no guarding and no CVA tenderness.    Positive Rovsing's sign  Musculoskeletal: She exhibits no edema.  Lymphadenopathy:    She has no cervical adenopathy.  Neurological: She is alert. Coordination normal.  Skin: Skin is warm and dry. No rash noted. She is not diaphoretic. No pallor.  Psychiatric: She has a normal mood and affect.  Nursing note and vitals reviewed.    ED Treatments / Results  Labs (all labs ordered are listed, but only abnormal results are displayed) Labs Reviewed  COMPREHENSIVE METABOLIC PANEL - Abnormal; Notable for the following components:      Result Value   Glucose, Bld 104 (*)    BUN 5 (*)    All other components within normal limits  URINALYSIS, ROUTINE W REFLEX MICROSCOPIC - Abnormal; Notable for the following components:   APPearance HAZY (*)    Leukocytes, UA TRACE (*)    Bacteria, UA RARE (*)    Squamous Epithelial / LPF 0-5 (*)    All other components within normal limits  LIPASE, BLOOD  CBC  I-STAT BETA HCG BLOOD, ED ([facility], [facility], [facility] ONLY)    EKG None  Radiology No results found.  Procedures Procedures  (including critical care time)  Medications Ordered in ED Medications  iopamidol (ISOVUE-300) 61 % injection (has no administration in time range)  morphine 4 MG/ML injection 4 mg (has no administration in time range)  ondansetron (ZOFRAN-ODT) disintegrating tablet 4 mg (4 mg Oral Given 10/07/17 0913)  sodium chloride 0.9 % bolus 1,000 mL (1,000 mLs Intravenous New Bag/Given 10/07/17 1432)  ondansetron (ZOFRAN) injection 4 mg (4 mg Intravenous Given 10/07/17 1433)  morphine 4 MG/ML injection 4 mg (4 mg Intravenous Given 10/07/17 1433)  iopamidol (ISOVUE-300) 61 % injection 100 mL (100 mLs Intravenous Contrast Given 10/07/17 1546)     Initial Impression / Assessment and Plan / ED Course  I have reviewed the triage vital signs and the nursing notes.  Pertinent labs & imaging results that were available during my care of the patient were reviewed by me and considered in my medical decision making (see chart for  details).     Patient presenting with abdominal pain, nausea, vomiting, and diarrhea.  Labs are unremarkable.  Negative pregnancy UA shows trace leukocytes.  Patient exam concerning for appendicitis.  She has McBurney's point tenderness and positive Rovsing sign.  CT abdomen pelvis pending at shift change to rule out appendicitis. At shift change, patient care transferred to [facility], PA-C for continued evaluation, follow up of CTAP and determination of disposition.    Final Clinical Impressions(s) / ED Diagnoses   Final diagnoses:  Nausea vomiting and diarrhea  Right lower quadrant abdominal pain    ED Discharge Orders    None       Frederica Kuster, PA-C 10/07/17 [place], DO 10/07/17 1922

## 2017-10-07 NOTE — ED Provider Notes (Signed)
PROGRESS NOTE                                                                                                                 This is a sign-out from [employer] at shift change: Amber Franco is a 47 y.o. female presenting with nausea vomiting diarrhea and right-sided abdominal pain, concern for possible appendicitis however, it is likely that this patient has a viral gastroenteritis because multiple people at her work are sick with similar.  She is afebrile, nontoxic-appearing.  Plan is to follow-up CT abdomen pelvis, p.o. challenge and reassess. Please refer to previous note for full HPI, ROS, PMH and PE.   CAT scan without any acute findings, they do see a liver lesion, recommend outpatient MRI.  Having had an extensive discussion with this patient and her friend.  They understand that it is important that this needs to be followed up in the next several weeks.  Patient is tolerating p.o.'s, work note provided, will give Zofran and we have had an extensive discussion of return precautions.     Amber Franco 10/07/17 1741    Amber Reichert, MD 10/07/17 1911

## 2017-10-07 NOTE — ED Notes (Signed)
Patient transported to CT 

## 2017-10-07 NOTE — ED Notes (Signed)
PA-C at bedside 

## 2017-10-07 NOTE — ED Notes (Signed)
PA-C states okay to drink - provided ginger ale. Tolerating well, reports nausea has gotten better.

## 2017-10-09 MED FILL — CYCLOBENZAPRINE 10 MG TAB: 10 | 15 days supply | Qty: 30 | Fill #1

## 2017-10-09 MED FILL — FLUTICASONE PROP 50 MCG SPR: 50 | 30 days supply | Qty: 16 | Fill #1

## 2017-10-13 MED FILL — GABAPENTIN 300 MG CAPSULE: 300 | 30 days supply | Qty: 90 | Fill #1

## 2017-10-23 MED FILL — ATENOLOL 50 MG TABLET: 50 | 30 days supply | Qty: 30 | Fill #1

## 2017-10-27 ENCOUNTER — Ambulatory Visit (HOSPITAL_COMMUNITY)
Admission: EM | Admit: 2017-10-27 | Discharge: 2017-10-27 | Disposition: A | Discharge status: Home / Self Care | Payer: BLUE CROSS/BLUE SHIELD | Attending: Internal Medicine | Admitting: Internal Medicine

## 2017-10-27 ENCOUNTER — Telehealth (HOSPITAL_COMMUNITY): Payer: Self-pay | Admitting: Emergency Medicine

## 2017-10-27 ENCOUNTER — Encounter (HOSPITAL_COMMUNITY): Payer: Self-pay | Admitting: Emergency Medicine

## 2017-10-27 ENCOUNTER — Other Ambulatory Visit: Payer: Self-pay

## 2017-10-27 DIAGNOSIS — M549 Dorsalgia, unspecified: Secondary | ICD-10-CM

## 2017-10-27 MED ORDER — KETOROLAC TROMETHAMINE 60 MG/2ML IM SOLN
INTRAMUSCULAR | Status: AC
  Filled 2017-10-27: qty 2

## 2017-10-27 MED ORDER — DICLOFENAC SODIUM 75 MG PO TBEC: 75.0000 mg | DELAYED_RELEASE_TABLET | Freq: Two times a day (BID) | ORAL | 0 refills | Status: DC

## 2017-10-27 MED ORDER — KETOROLAC TROMETHAMINE 60 MG/2ML IM SOLN
60.0000 mg | Freq: Once | INTRAMUSCULAR | Status: AC
  Administered 2017-10-27: 60 mg via INTRAMUSCULAR

## 2017-10-27 NOTE — ED Provider Notes (Signed)
[place]    CSN: [id number] Arrival date & time: 10/27/17  1341     History   Chief Complaint Chief Complaint  Patient presents with  . Back Pain    HPI Amber Franco is a 47 y.o. female.   Damika presents with complaints of generalized back pain which started after she was moving yesterday with packing and moving boxes. States she felt she may have over done it while trying to move a box of books. Has had previous back pain exacerbations.  Has not taken any medications for pain today. Yesterday took two advil which did not help. Pain was worse this morning. Without numbness or tingling to arms or legs. Pain 11/10. Denies urinary or stool incontinence, denies weakness to extremities. Hx of htn, fibromyalgia, neuropathy, anxiety, back spasms. She states she is due to have her muscle relaxer refilled this Friday.     ROS per HPI.      Past Medical History:  Diagnosis Date  . Allergy   . Hypertension   . Sickle cell trait ([place])   . Stroke [facility])     Patient Active Problem List   Diagnosis Date Noted  . Depression 10/30/2016  . Fibromyalgia 10/30/2016  . Neuropathy 08/05/2016  . Generalized anxiety disorder 01/22/2016  . Hypertension 10/23/2015  . Allergic rhinitis 10/23/2015  . Tobacco abuse 10/23/2015  . Spasm of muscle, back 10/23/2015  . Health care maintenance 10/23/2015    Past Surgical History:  Procedure Laterality Date  . TUBAL LIGATION      OB History   None      Home Medications    Prior to Admission medications   Medication Sig Start Date End Date Taking? Authorizing Provider  atenolol (TENORMIN) 50 MG tablet Take 1 tablet (50 mg total) by mouth daily. 09/15/17  Yes Velna Ochs, MD  cyclobenzaprine (FLEXERIL) 10 MG tablet Take 1 tablet (10 mg total) by mouth 2 (two) times daily as needed for muscle spasms. 09/15/17  Yes Velna Ochs, MD  fluticasone (FLONASE) 50 MCG/ACT nasal spray Place 2 sprays into both nostrils daily.  09/15/17 09/15/18 Yes Velna Ochs, MD  gabapentin (NEURONTIN) 300 MG capsule Take 1 capsule (300 mg total) by mouth 3 (three) times daily. 09/15/17 09/15/18 Yes Velna Ochs, MD  loratadine (CLARITIN) 10 MG tablet Take 1 tablet (10 mg total) by mouth daily as needed for allergies. 09/15/17  Yes Velna Ochs, MD  ondansetron (ZOFRAN) 4 MG tablet Take 1 tablet (4 mg total) by mouth every 8 (eight) hours as needed for nausea or vomiting. 10/07/17  Yes Pisciotta, Elmyra Ricks, PA-C  diclofenac (VOLTAREN) 75 MG EC tablet Take 1 tablet (75 mg total) by mouth 2 (two) times daily. 10/27/17   Zigmund Gottron, NP    Family History Family History  Problem Relation Age of Onset  . Cancer Mother   . Hypertension Father   . Stroke Father     Social History Social History   Tobacco Use  . Smoking status: Current Every Day Smoker    Packs/day: 0.25    Types: Cigarettes  . Smokeless tobacco: Never Used  . Tobacco comment: 2 per day . Increase x 1 month now back down  Substance Use Topics  . Alcohol use: No  . Drug use: No     Allergies   Codeine; Hydrocodone; Strawberry (diagnostic); and Tape   Review of Systems Review of Systems   Physical Exam Triage Vital Signs ED Triage Vitals  Enc Vitals Group  BP 10/27/17 1519 (!) 122/91     Pulse Rate 10/27/17 1519 (!) 107     Resp --      Temp 10/27/17 1519 98.6 F (37 C)     Temp Source 10/27/17 1519 Oral     SpO2 --      Weight --      Height --      Head Circumference --      Peak Flow --      Pain Score 10/27/17 1516 10     Pain Loc --      Pain Edu? --      Excl. in [place]? --    No data found.  Updated Vital Signs BP (!) 122/91 (BP Location: Left Arm)   Pulse (!) 107   Temp 98.6 F (37 C) (Oral)   LMP 10/12/2017 (Exact Date)   Visual Acuity Right Eye Distance:   Left Eye Distance:   Bilateral Distance:    Right Eye Near:   Left Eye Near:    Bilateral Near:     Physical Exam  Constitutional: She is oriented  to person, place, and time. She appears well-developed and well-nourished. No distress.  Cardiovascular: Regular rhythm and normal heart sounds. Tachycardia present.  Pulmonary/Chest: Effort normal and breath sounds normal.  Musculoskeletal:       Cervical back: She exhibits tenderness, bony tenderness, pain and spasm.       Thoracic back: She exhibits tenderness, bony tenderness, pain and spasm.       Lumbar back: She exhibits tenderness, bony tenderness, pain and spasm.       Back:  Entire back with tenderness on palpation; full rom to arms and legs with sensation intact; strength equal bilaterally; states pain to musculature is worse than at midline spinous process; without pain with straight leg raise but pain with any back movement  Neurological: She is alert and oriented to person, place, and time.  Skin: Skin is warm and dry.     UC Treatments / Results  Labs (all labs ordered are listed, but only abnormal results are displayed) Labs Reviewed - No data to display  EKG None  Radiology No results found.  Procedures Procedures (including critical care time)  Medications Ordered in UC Medications  ketorolac (TORADOL) injection 60 mg (has no administration in time range)    Initial Impression / Assessment and Plan / UC Course  I have reviewed the triage vital signs and the nursing notes.  Pertinent labs & imaging results that were available during my care of the patient were reviewed by me and considered in my medical decision making (see chart for details).     Strain to back s/p moving and lifting boxes yesterday. Was worse with wakening due to spasms. toradol provided in clinic today, diclofenac BID. continue to follow with PCP for management. Return precautions provided. Patient verbalized understanding and agreeable to plan.  Ambulatory out of clinic without difficulty.    Final Clinical Impressions(s) / UC Diagnoses   Final diagnoses:  Acute back pain,  unspecified back location, unspecified back pain laterality     Discharge Instructions     Light and regular activity. May start diclofenac later tonight, take with food and drink plenty of water. Please continue to follow with your primary care provider for management.    ED Prescriptions    Medication Sig Dispense Auth. Provider   diclofenac (VOLTAREN) 75 MG EC tablet Take 1 tablet (75 mg total) by mouth 2 (two)  times daily. 30 tablet Zigmund Gottron, NP     Controlled Substance Prescriptions [place] Controlled Substance Registry consulted? Not Applicable   Zigmund Gottron, NP 10/27/17 [id number]

## 2017-10-27 NOTE — Discharge Instructions (Signed)
Light and regular activity. May start diclofenac later tonight, take with food and drink plenty of water. Please continue to follow with your primary care provider for management.

## 2017-10-27 NOTE — ED Triage Notes (Signed)
Pt complains of back pain from moving yesterday.  She states she is having back spasms from her shoulder to her lower back.

## 2017-12-04 MED FILL — ATENOLOL 50 MG TABLET: 50 | 30 days supply | Qty: 30 | Fill #2

## 2017-12-08 MED FILL — GABAPENTIN 300 MG CAPSULE: 300 | 30 days supply | Qty: 90 | Fill #2

## 2017-12-08 MED FILL — CYCLOBENZAPRINE 10 MG TAB: 10 | 15 days supply | Qty: 30 | Fill #2

## 2017-12-08 MED FILL — FLUTICASONE PROP 50 MCG SPR: 50 | 30 days supply | Qty: 16 | Fill #2

## 2018-01-02 MED FILL — CYCLOBENZAPRINE 10 MG TAB: 10 | 15 days supply | Qty: 30 | Fill #3

## 2018-01-02 MED FILL — ATENOLOL 50 MG TABLET: 50 | 30 days supply | Qty: 30 | Fill #3

## 2018-01-05 MED FILL — GABAPENTIN 300 MG CAPSULE: 300 | 30 days supply | Qty: 90 | Fill #3

## 2018-02-06 MED FILL — GABAPENTIN 300 MG CAPSULE: 300 | 30 days supply | Qty: 90 | Fill #4

## 2018-02-16 MED FILL — CYCLOBENZAPRINE 10 MG TAB: 10 | 15 days supply | Qty: 30 | Fill #4

## 2018-02-16 MED FILL — ATENOLOL 50 MG TABLET: 50 | 30 days supply | Qty: 30 | Fill #4

## 2018-05-24 IMAGING — MR MR THORACIC SPINE W/O CM
4 of 7 series · 17 of 48 positions shown · non-contrast
Comparison: None.

CLINICAL DATA: Polyarthritis. Paraspinal pain and LEFT extremity
pain. Assess for radiculopathy or demyelination.

EXAM:
MRI CERVICAL, THORACIC AND LUMBAR SPINE WITHOUT CONTRAST
TECHNIQUE: Multiplanar and multiecho pulse sequences of the cervical spine, to
include the craniocervical junction and cervicothoracic junction,
and thoracic and lumbar spine, were obtained without intravenous
contrast.

[Series 5: T1 · sagittal · 3.0mm · 0.62mm/px · 3 of 13 slices shown (1 of 2)]
[im 1/13]
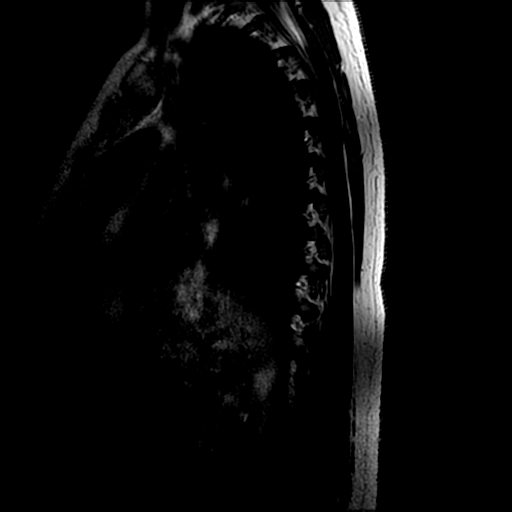
[im 7/13]
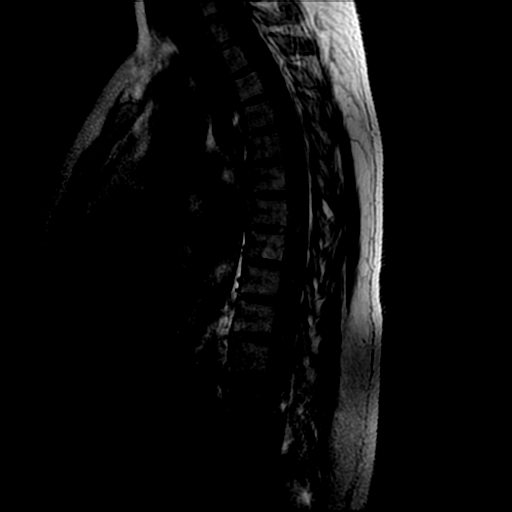
[im 13/13]
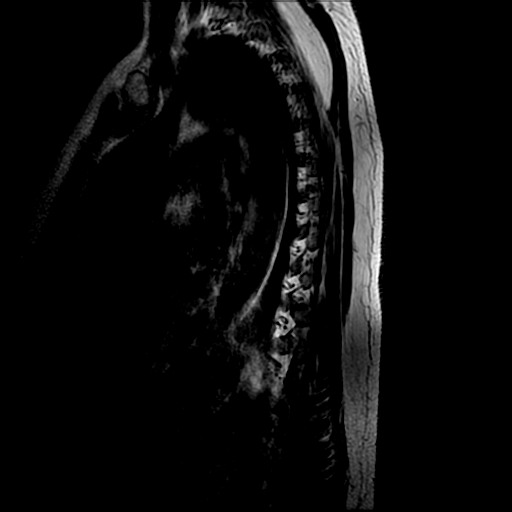

[Series 8: T1 · sagittal · 3.0mm · 0.62mm/px · 3 of 13 slices shown (2 of 2)]
[im 1/13]
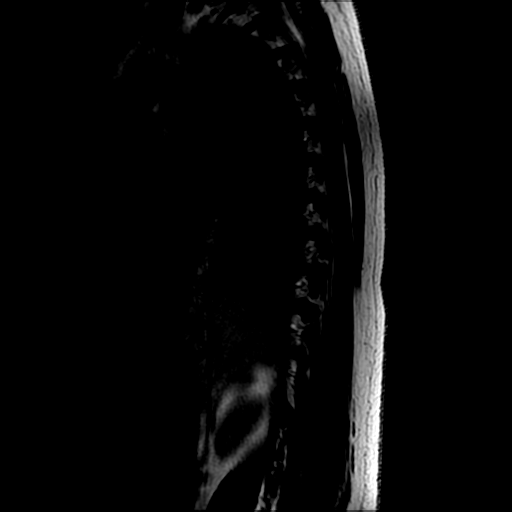
[im 9/13]
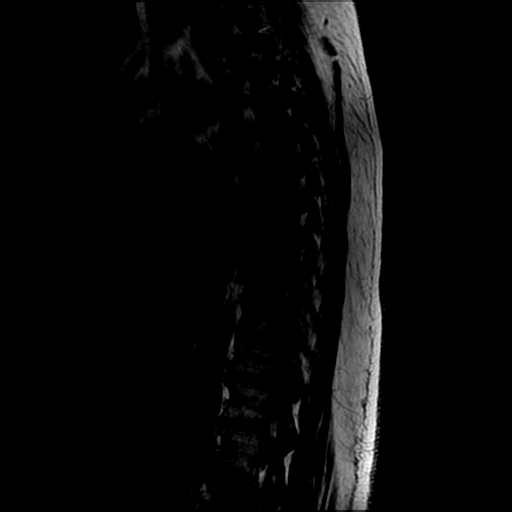
[im 13/13]
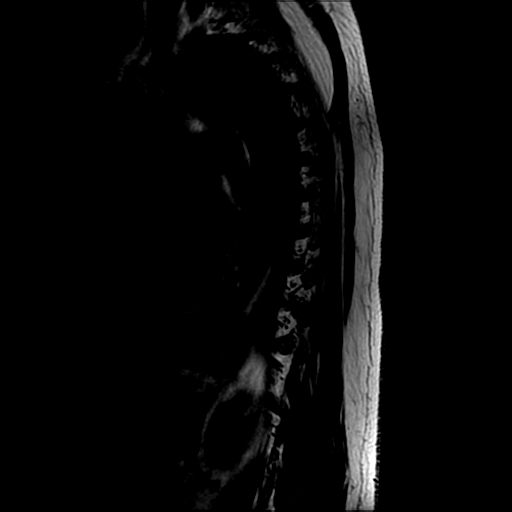

[Series 9: T2 post-contrast · sagittal · 3.0mm · 0.62mm/px · 4 of 13 slices shown]
[im 1/13]
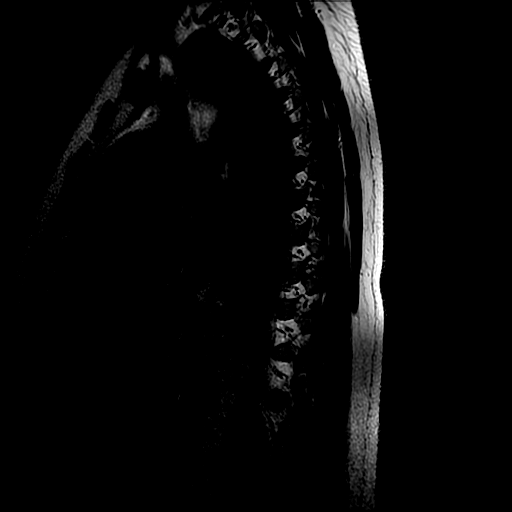
[im 5/13]
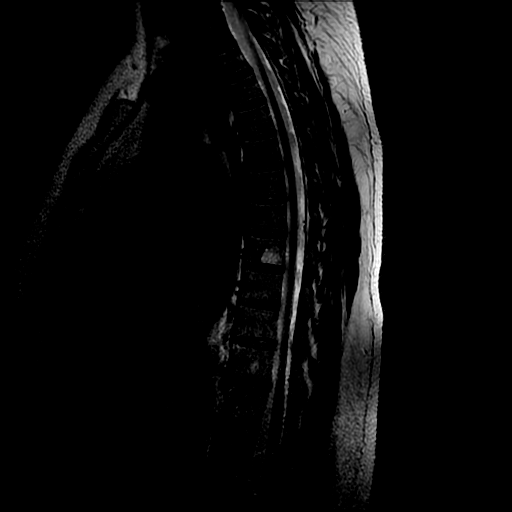
[im 9/13]
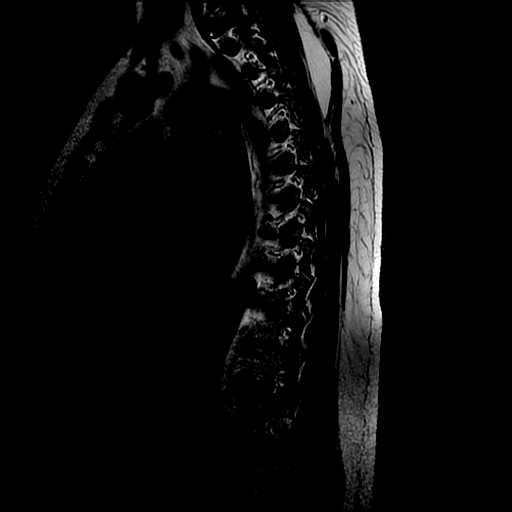
[im 13/13]
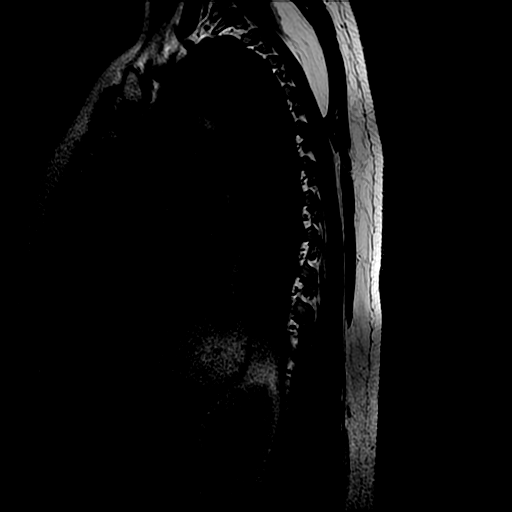

[Series 10: T2 · axial · 4.0mm · 0.39mm/px · z∈[-276,-93]mm · 7 of 39 slices shown]
[im 1/39]
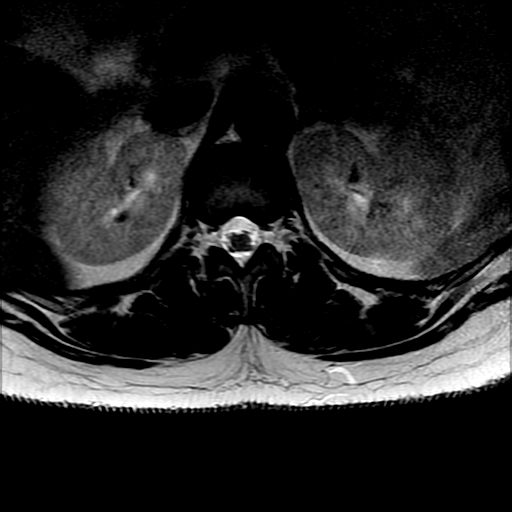
[im 7/39]
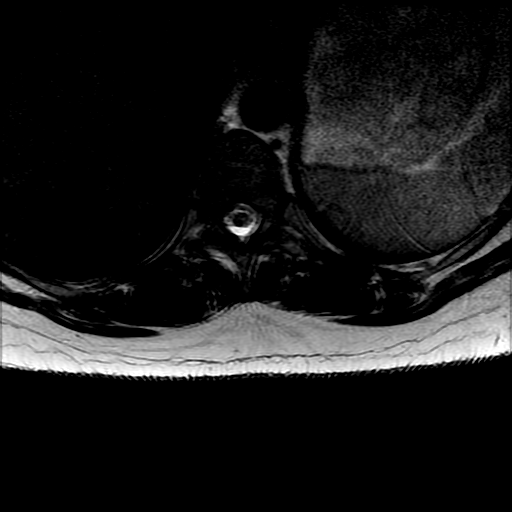
[im 13/39]
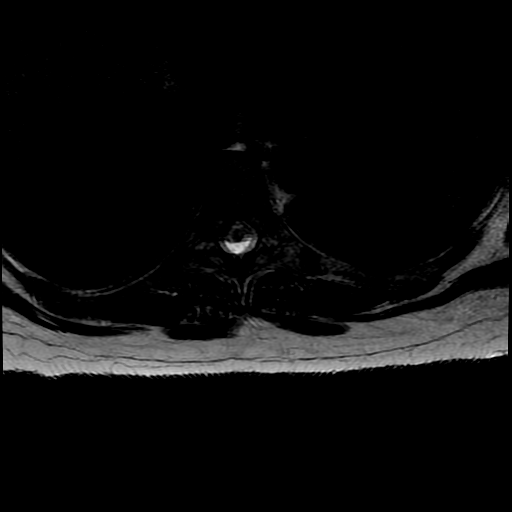
[im 16/39]
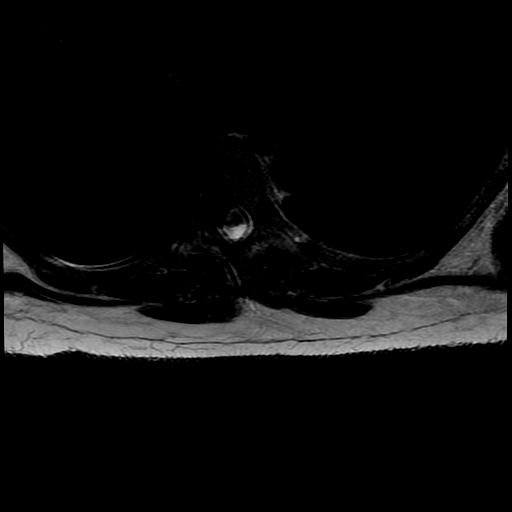
[im 20/39]
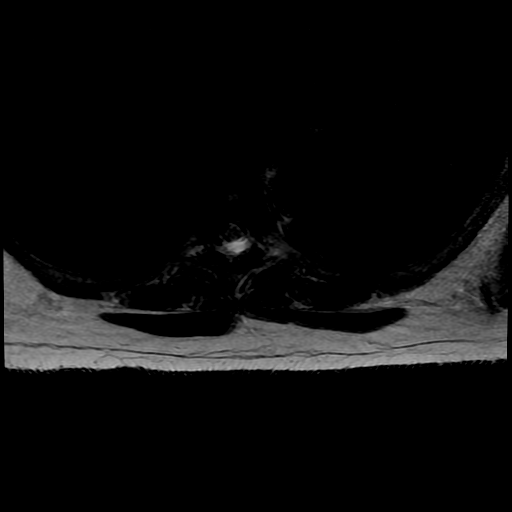
[im 23/39]
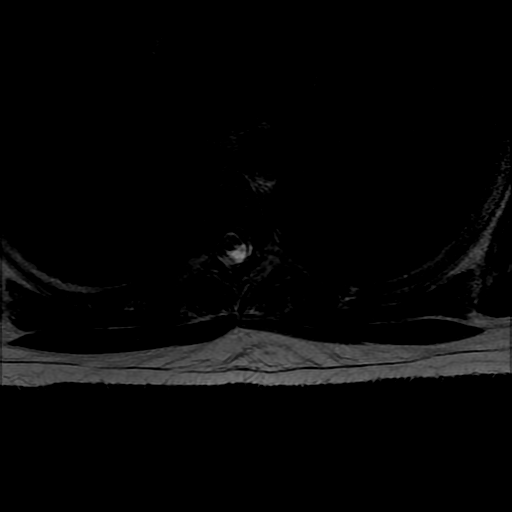
[im 32/39]
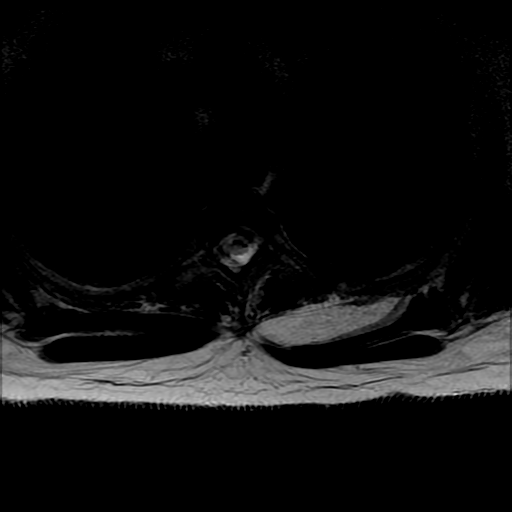

[17 of 48 positions shown; findings below may reference images not displayed]

FINDINGS: MRI CERVICAL SPINE FINDINGS

ALIGNMENT: Straightened cervical lordosis.  No malalignment.

VERTEBRAE/DISCS: Vertebral bodies are intact. Intervertebral disc
morphology's and signal are normal. Minimal chronic discogenic
endplate changes C5-6 and C6-7. No abnormal bone marrow signal.

CORD:Cervical spinal cord is normal morphology and signal
characteristics from the cervicomedullary junction to level of T1-2,
the most caudal well visualized level.

POSTERIOR FOSSA, VERTEBRAL ARTERIES, PARASPINAL TISSUES: No MR
findings of ligamentous injury. Vertebral artery flow voids present.
Included posterior fossa and paraspinal soft tissues are normal.

DISC LEVELS:

C2-3: No disc bulge, canal stenosis nor neural foraminal narrowing.
Mild LEFT facet arthropathy.

C3-4 and C4-5: Uncovertebral hypertrophy mild facet arthropathy
without canal stenosis or neural foraminal narrowing.

C5-6: Small broad-based disc bulge asymmetric to the RIGHT.
Uncovertebral hypertrophy and minimal facet arthropathy. No canal
stenosis. Mild RIGHT neural foraminal narrowing.

C6-7 and C7-T1: No disc bulge, canal stenosis nor neural foraminal
narrowing.

MRI THORACIC SPINE FINDINGS- multiple sequences are moderately
motion degraded.

ALIGNMENT: Maintenance of the thoracic kyphosis. No malalignment.

VERTEBRAE/DISCS: Vertebral bodies are intact. Intervertebral discs
morphology and signal are normal. Multilevel hemangiomas. No
abnormal bone marrow signal.

CORD: Thoracic spinal cord is normal morphology and signal
characteristics to the level of the conus medullaris which
terminates at T12-L1.

PREVERTEBRAL AND PARASPINAL SOFT TISSUES: 5.5 x 1.7 x 8.2 cm LEFT
intramuscular paraspinal lipoma.

DISC LEVELS:

Moderately motion degraded axial sequences. No disc bulge, canal
stenosis nor neural foraminal narrowing.

MRI LUMBAR SPINE FINDINGS- mild motion degraded examination.

SEGMENTATION: For the purposes of this report, the last well-formed
intervertebral disc will be described as L5-S1.

ALIGNMENT: Maintenance of the lumbar lordosis. No malalignment.

VERTEBRAE:Vertebral bodies are intact. Intervertebral discs
demonstrate normal morphology and signal characteristics. No
abnormal bone marrow signal.

CONUS MEDULLARIS: Conus medullaris terminates at L1-2 and
demonstrates normal morphology and signal characteristics. Cauda
equina is normal.

PARASPINAL AND SOFT TISSUES: Included prevertebral and paraspinal
soft tissues are normal.

DISC LEVELS:

L1-2 through L5-S1: No disc bulge, canal stenosis nor neural
foraminal narrowing. Mild lower lumbar facet arthropathy.
IMPRESSION: MRI CERVICAL SPINE:  No evidence of demyelination.

No canal stenosis.  Mild RIGHT C5-6 neural foraminal narrowing.

MRI THORACIC SPINE: Moderately motion degraded examination without
evidence of demyelination.

No canal stenosis or neural foraminal narrowing.

5.1 x 1.7 x 8.2 cm LEFT intramuscular paraspinal lipoma.

MRI LUMBAR SPINE: Mild motion degraded examination. Early
degenerative change without canal stenosis or neural foraminal
narrowing.

## 2018-07-02 MED FILL — CYCLOBENZAPRINE 10 MG TAB: 10 | 15 days supply | Qty: 30 | Fill #5

## 2018-07-02 MED FILL — GABAPENTIN 300 MG CAPSULE: 300 | 30 days supply | Qty: 90 | Fill #5

## 2018-07-24 ENCOUNTER — Other Ambulatory Visit: Payer: Self-pay | Admitting: Internal Medicine

## 2018-07-24 DIAGNOSIS — G629 Polyneuropathy, unspecified: Secondary | ICD-10-CM

## 2018-07-29 NOTE — Telephone Encounter (Signed)
Please schedule pt an appt with Dr Philipp Ovens per her request. Thanks

## 2018-07-29 NOTE — Telephone Encounter (Signed)
Refilled gabapentin. Please have patient schedule an appointment with me. Thanks!

## 2018-07-30 MED FILL — GABAPENTIN 300 MG CAPSULE: 300 | 30 days supply | Qty: 90 | Fill #0

## 2018-08-24 ENCOUNTER — Encounter: Payer: BLUE CROSS/BLUE SHIELD | Admitting: Internal Medicine

## 2018-08-24 ENCOUNTER — Encounter: Payer: Self-pay | Admitting: Internal Medicine

## 2018-09-28 ENCOUNTER — Other Ambulatory Visit: Payer: Self-pay

## 2018-09-28 ENCOUNTER — Encounter: Payer: Self-pay | Admitting: Internal Medicine

## 2018-10-15 ENCOUNTER — Other Ambulatory Visit: Payer: Self-pay | Admitting: Internal Medicine

## 2018-10-15 DIAGNOSIS — M797 Fibromyalgia: Secondary | ICD-10-CM

## 2018-10-15 MED FILL — GABAPENTIN 300 MG CAPSULE: 300 | 30 days supply | Qty: 90 | Fill #0

## 2018-10-16 ENCOUNTER — Telehealth: Payer: Self-pay

## 2018-10-16 MED FILL — CYCLOBENZAPRINE HCL 10 MG T: 10 | 15 days supply | Qty: 30 | Fill #0

## 2018-10-16 NOTE — Telephone Encounter (Signed)
Return call from pt - Tele health appt schedule by front office for Monday @ 1415 .

## 2018-10-16 NOTE — Telephone Encounter (Signed)
Pt was called by front office, no answer,message was left to call the office.

## 2018-10-16 NOTE — Telephone Encounter (Signed)
LVM for patient to call back for telehealth appt.

## 2018-10-16 NOTE — Telephone Encounter (Signed)
Has not been seen in 1 year.  Needs virtual appointment with Dr. Philipp Ovens.   1 month refill only until appointment.

## 2018-10-19 ENCOUNTER — Other Ambulatory Visit: Payer: Self-pay

## 2018-10-19 ENCOUNTER — Ambulatory Visit: Payer: Self-pay | Admitting: Internal Medicine

## 2018-10-19 DIAGNOSIS — M797 Fibromyalgia: Secondary | ICD-10-CM

## 2018-10-19 DIAGNOSIS — J309 Allergic rhinitis, unspecified: Secondary | ICD-10-CM

## 2018-10-19 MED ORDER — FLUTICASONE PROPIONATE 50 MCG/ACT NA SUSP: 2.0000 | Freq: Every day | NASAL | 5 refills | Status: DC

## 2018-10-19 MED FILL — FLUTICASONE PROP 50 MCG SPR: 50 | 16 days supply | Qty: 16 | Fill #0

## 2018-10-19 NOTE — Progress Notes (Signed)
Medicine attending: Medical history, presenting problems, , and medications, reviewed with resident physician Dr Ina Homes on the day of the patient telephone consultation and I concur with his evaluation and management plan. Pt got requested med refills on 4/17. No charge for this phone check.

## 2018-10-19 NOTE — Progress Notes (Signed)
Patient scheduled for telehealth visit for med refills. She had all her medication refilled on 10/16/2018. She also recently had an appointment with her PCP to discuss these medications and the treatment of her fibromyalgia.   She is overall doing well and states that she only needs a refill on her Flonase. She will pick up her medications today from the [facility].

## 2018-10-20 ENCOUNTER — Telehealth: Payer: Self-pay

## 2018-10-20 NOTE — Telephone Encounter (Signed)
Thank you :)

## 2018-10-20 NOTE — Telephone Encounter (Signed)
Return pt's call - stated pollen is causing her to have difficulty breathing; she's taking Claritin but requesting an inhaler. Stated she has used one many yrs ago. I suggested a tele health visit - she's agreeable. [facility] tele health visit scheduled for tomorrow @ 1445 PM.

## 2018-10-20 NOTE — Telephone Encounter (Signed)
Requesting to speak with a nurse about getting inhaler. Please call pt back.

## 2018-10-21 ENCOUNTER — Other Ambulatory Visit: Payer: Self-pay

## 2018-10-21 ENCOUNTER — Ambulatory Visit (INDEPENDENT_AMBULATORY_CARE_PROVIDER_SITE_OTHER): Payer: Self-pay | Admitting: Internal Medicine

## 2018-10-21 DIAGNOSIS — J309 Allergic rhinitis, unspecified: Secondary | ICD-10-CM

## 2018-10-21 MED ORDER — ALBUTEROL SULFATE HFA 108 (90 BASE) MCG/ACT IN AERS: 2.0000 | INHALATION_SPRAY | Freq: Four times a day (QID) | RESPIRATORY_TRACT | 2 refills | Status: DC | PRN

## 2018-10-21 MED FILL — VENTOLIN HFA 90 MCG INHALER: 108 (90 BAS | 25 days supply | Qty: 18 | Fill #0 | Status: TO

## 2018-10-21 NOTE — Assessment & Plan Note (Signed)
HPI:  Patient called in with complaints of congestion, productive cough, and SHOB. She states that this tends to be how her allergies flare during high pollen times. She typically uses Flonase, Claritin, and OTC primatene inhalers. Yesterday with her symptoms she tried her Primatene inhaler but did not get relief. She then used her sisters albuterol inhaler which significantly helped. In addition to the above symptoms she has also been have bilateral conjunctivitis, itchy eyes, and itchy nose. She denies systemic signs of infection.   A/P: - Albuterol prescription sent out  - Patient advised to come to the clinic if symptoms worsen

## 2018-10-21 NOTE — Progress Notes (Signed)
Medicine attending: Medical history, presenting problems,  and medications, reviewed with resident physician Dr Justin Helberg on the day of the patient telephone consultation and I concur with his evaluation and management plan. 

## 2018-10-21 NOTE — Progress Notes (Signed)
   CC: Allergies  This is a telephone encounter between [employer] and [employer] on 10/21/2018 for allergies. The visit was conducted with the patient located at home and [facility] at [facility]. The patient's identity was confirmed using their DOB and current address. The patient has consented to being evaluated through a telephone encounter and understands the associated risks (an examination cannot be done and the patient may need to come in for an appointment) / benefits (allows the patient to remain at home, decreasing exposure to coronavirus). I personally spent 14 minutes on medical discussion.   HPI:  Amber Franco is a 48 y.o. with PMH as below.   Please see A&P for assessment of the patient's acute and chronic medical conditions.   Past Medical History:  Diagnosis Date  . Allergy   . Hypertension   . Sickle cell trait ([place])   . Stroke [facility])    Review of Systems:  Performed and all others negative.  Assessment & Plan:   See Encounters Tab for problem based charting.  Patient discussed with Dr. Beryle Beams

## 2018-11-11 ENCOUNTER — Other Ambulatory Visit: Payer: Self-pay | Admitting: Internal Medicine

## 2018-11-11 DIAGNOSIS — M797 Fibromyalgia: Secondary | ICD-10-CM

## 2018-11-11 MED FILL — GABAPENTIN 300 MG CAPSULE: 300 | 30 days supply | Qty: 90 | Fill #1 | Status: TO

## 2018-11-12 MED FILL — CYCLOBENZAPRINE HCL 10 MG T: 10 | 15 days supply | Qty: 30 | Fill #0

## 2018-11-24 MED FILL — VENTOLIN HFA 90 MCG INHALER: 108 (90 BAS | 25 days supply | Qty: 18 | Fill #0

## 2018-12-07 MED FILL — GABAPENTIN 300 MG CAPSULE: 300 | 30 days supply | Qty: 90 | Fill #0

## 2018-12-21 MED FILL — FLUTICASONE PROP 50 MCG SPR: 50 | 16 days supply | Qty: 16 | Fill #0

## 2018-12-21 MED FILL — VENTOLIN HFA 90 MCG INHALER: 108 (90 BAS | 25 days supply | Qty: 18 | Fill #1

## 2019-01-04 MED FILL — GABAPENTIN 300 MG CAPSULE: 300 | 30 days supply | Qty: 90 | Fill #1

## 2019-02-01 ENCOUNTER — Other Ambulatory Visit: Payer: Self-pay | Admitting: Internal Medicine

## 2019-02-01 DIAGNOSIS — J309 Allergic rhinitis, unspecified: Secondary | ICD-10-CM

## 2019-02-01 MED FILL — GABAPENTIN 300 MG CAPSULE: 300 | 30 days supply | Qty: 90 | Fill #2

## 2019-02-01 MED FILL — VENTOLIN HFA 90 MCG INHALER: 108 (90 BAS | 25 days supply | Qty: 18 | Fill #0

## 2019-03-10 ENCOUNTER — Other Ambulatory Visit: Payer: Self-pay | Admitting: Internal Medicine

## 2019-03-10 DIAGNOSIS — M797 Fibromyalgia: Secondary | ICD-10-CM

## 2019-03-10 DIAGNOSIS — G629 Polyneuropathy, unspecified: Secondary | ICD-10-CM

## 2019-03-10 MED FILL — GABAPENTIN 300 MG CAPSULE: 300 | 30 days supply | Qty: 90 | Fill #0

## 2019-03-10 MED FILL — VENTOLIN HFA 90 MCG INHALER: 108 (90 BAS | 25 days supply | Qty: 18 | Fill #1

## 2019-03-10 MED FILL — CYCLOBENZAPRINE 10 MG TAB: 10 | 15 days supply | Qty: 30 | Fill #0

## 2019-03-10 MED FILL — FLUTICASONE PROP 50 MCG SPR: 50 | 30 days supply | Qty: 16 | Fill #1

## 2019-03-31 ENCOUNTER — Other Ambulatory Visit: Payer: Self-pay

## 2019-03-31 ENCOUNTER — Ambulatory Visit (INDEPENDENT_AMBULATORY_CARE_PROVIDER_SITE_OTHER): Payer: Self-pay | Admitting: Internal Medicine

## 2019-03-31 DIAGNOSIS — M797 Fibromyalgia: Secondary | ICD-10-CM

## 2019-03-31 DIAGNOSIS — Z79899 Other long term (current) drug therapy: Secondary | ICD-10-CM

## 2019-03-31 DIAGNOSIS — J309 Allergic rhinitis, unspecified: Secondary | ICD-10-CM

## 2019-03-31 DIAGNOSIS — G629 Polyneuropathy, unspecified: Secondary | ICD-10-CM

## 2019-03-31 DIAGNOSIS — G8929 Other chronic pain: Secondary | ICD-10-CM

## 2019-03-31 MED ORDER — FLUTICASONE PROPIONATE 50 MCG/ACT NA SUSP: 2.0000 | Freq: Every day | NASAL | 5 refills | Status: DC

## 2019-03-31 MED ORDER — CYCLOBENZAPRINE HCL 10 MG PO TABS: 10.0000 mg | ORAL_TABLET | Freq: Three times a day (TID) | ORAL | 0 refills | Status: DC | PRN

## 2019-03-31 MED ORDER — GABAPENTIN 300 MG PO CAPS: 300.0000 mg | ORAL_CAPSULE | Freq: Three times a day (TID) | ORAL | 2 refills | Status: DC

## 2019-03-31 MED ORDER — ALBUTEROL SULFATE HFA 108 (90 BASE) MCG/ACT IN AERS
1.0000 | INHALATION_SPRAY | Freq: Four times a day (QID) | RESPIRATORY_TRACT | 1 refills | Status: DC | PRN
Start: 2019-03-31 — End: 2023-07-06

## 2019-03-31 NOTE — Progress Notes (Signed)
    This is a telephone encounter between [employer] and Velna Ochs on 03/31/2019 for fibromyalgia follow up. The visit was conducted with the patient located at home and Velna Ochs at [facility]. The patient's identity was confirmed using their DOB and current address. The patient has consented to being evaluated through a telephone encounter and understands the associated risks (an examination cannot be done and the patient may need to come in for an appointment) / benefits (allows the patient to remain at home, decreasing exposure to coronavirus). I personally spent 12 minutes on medical discussion.   CC: Fibromyalgia  HPI:  Ms.Guerline Trojanowski is a 48 y.o. female with past medical history outlined below. Patient was contacted for a telehealth appointment for fibromyalgia follow up. For the details of today's visit, please refer to the assessment and plan.   Review of Systems  Respiratory: Positive for shortness of breath.   Cardiovascular: Negative for chest pain.  Musculoskeletal: Positive for back pain.     Assessment & Plan:   See Encounters Tab for problem based charting.

## 2019-03-31 NOTE — Assessment & Plan Note (Signed)
Refilled Flonase, 2 sprays each nostril daily.

## 2019-03-31 NOTE — Assessment & Plan Note (Signed)
Patient has a history of fibromyalgia that is currently well controlled.  She has been doing exceptionally well with diet and exercise at home.  She has lost 10 pounds over the summer and has been exercising regularly.  Her chronic back pain symptoms are well controlled on gabapentin 300 mg 3 times daily.  She is also prescribed Flexeril as needed but has not required this recently.  She takes this only as needed for breakthrough pain.  She has also not required any NSAIDs recently.  Encourage patient to keep up the good work with her lifestyle changes.  We will continue with current regimen. -Refilled gabapentin 300 mg 3 times daily - Refilled Flexeril as needed --Follow-up 6 months

## 2019-04-05 ENCOUNTER — Other Ambulatory Visit: Payer: Self-pay | Admitting: Internal Medicine

## 2019-04-05 DIAGNOSIS — J309 Allergic rhinitis, unspecified: Secondary | ICD-10-CM

## 2019-04-05 DIAGNOSIS — M797 Fibromyalgia: Secondary | ICD-10-CM

## 2019-04-06 ENCOUNTER — Other Ambulatory Visit: Payer: Self-pay | Admitting: Internal Medicine

## 2019-04-06 DIAGNOSIS — J309 Allergic rhinitis, unspecified: Secondary | ICD-10-CM

## 2019-04-07 ENCOUNTER — Other Ambulatory Visit: Payer: Self-pay | Admitting: Internal Medicine

## 2019-04-07 DIAGNOSIS — M797 Fibromyalgia: Secondary | ICD-10-CM

## 2019-04-07 DIAGNOSIS — J309 Allergic rhinitis, unspecified: Secondary | ICD-10-CM

## 2019-04-08 ENCOUNTER — Other Ambulatory Visit: Payer: Self-pay

## 2019-04-08 ENCOUNTER — Other Ambulatory Visit: Payer: Self-pay | Admitting: *Deleted

## 2019-04-08 DIAGNOSIS — J309 Allergic rhinitis, unspecified: Secondary | ICD-10-CM

## 2019-04-08 MED FILL — ALBUTEROL SULFATE HFA 108 (: 108 (90 BAS | 50 days supply | Qty: 9 | Fill #0

## 2019-04-08 MED FILL — FLUTICASONE PROP 50 MCG SPR: 50 | 30 days supply | Qty: 16 | Fill #0

## 2019-04-08 MED FILL — CYCLOBENZAPRINE 10 MG TAB: 10 | 10 days supply | Qty: 30 | Fill #0

## 2019-04-08 MED FILL — GABAPENTIN 300 MG CAPSULE: 300 | 30 days supply | Qty: 90 | Fill #0

## 2019-04-08 NOTE — Telephone Encounter (Signed)
Pt requesting a call back about her Refill.

## 2019-04-08 NOTE — Telephone Encounter (Signed)
Requesting to speak with a nurse about meds. Please call back.  

## 2019-04-08 NOTE — Telephone Encounter (Signed)
Spoke to pt, [facility], she states she has insurance now and wants to [facility] using [facility], updated her profile.

## 2019-04-09 ENCOUNTER — Telehealth: Payer: Self-pay | Admitting: Internal Medicine

## 2019-04-09 NOTE — Telephone Encounter (Signed)
Attempted to call, no response. Left voicemail. Will try again Monday.

## 2019-04-09 NOTE — Telephone Encounter (Signed)
Dr Nadine Counts would like to talk to Dr Philipp Ovens regarding patient, pt has moved to [place] and wanting to est care with practice pls contact physician cell number [phone]

## 2019-04-12 ENCOUNTER — Telehealth: Payer: Self-pay | Admitting: Internal Medicine

## 2019-04-12 NOTE — Telephone Encounter (Signed)
Received a call from Dr. Nadine Counts in [place]. Apparently patient has moved there and established care with their practice. But then told Dr. Nadine Counts that she wanted me to remain her PCP so unclear if she is traveling back and forth. I recently had a tele visit with her on 03/31/2019 where I refilled her flexeril and gabapentin (sent to [facility], [place]). She had a tele visit with Dr. Nadine Counts last week where she requested refills of both of these medications again. Reported that se did not pick up her prescriptions in [place]. Dr. Nadine Counts will check with [facility].   Patient did not mention being in [place] to me when we spoke during her telehealth visit. I was unaware that she was planning to move. If she remains in [place], I will be unable to remain her PCP.

## 2019-04-12 NOTE — Telephone Encounter (Signed)
Please see my recent note. Can we call patient and ask her to clarify if she is moving?

## 2019-04-13 NOTE — Telephone Encounter (Signed)
Still cannot reach pt but rec'd a call from a dr office in [place] stating pt is living there now and that she has found out pt's daughter picked up all her meds to sell on the street. [facility] and [facility] all scripts Will tell front office also

## 2023-07-06 ENCOUNTER — Emergency Department (HOSPITAL_COMMUNITY): Payer: Self-pay

## 2023-07-06 ENCOUNTER — Inpatient Hospital Stay (HOSPITAL_COMMUNITY)
Admission: EM | Admit: 2023-07-06 | Discharge: 2023-07-13 | DRG: 286 | Disposition: A | Discharge status: Home / Self Care | Payer: Medicaid Other | Attending: Family Medicine | Admitting: Family Medicine

## 2023-07-06 ENCOUNTER — Inpatient Hospital Stay (HOSPITAL_COMMUNITY): Payer: Self-pay

## 2023-07-06 ENCOUNTER — Other Ambulatory Visit: Payer: Self-pay

## 2023-07-06 DIAGNOSIS — Z8249 Family history of ischemic heart disease and other diseases of the circulatory system: Secondary | ICD-10-CM

## 2023-07-06 DIAGNOSIS — Z823 Family history of stroke: Secondary | ICD-10-CM | POA: Diagnosis not present

## 2023-07-06 DIAGNOSIS — I5181 Takotsubo syndrome: Secondary | ICD-10-CM | POA: Diagnosis not present

## 2023-07-06 DIAGNOSIS — Z885 Allergy status to narcotic agent status: Secondary | ICD-10-CM | POA: Diagnosis not present

## 2023-07-06 DIAGNOSIS — Z79899 Other long term (current) drug therapy: Secondary | ICD-10-CM | POA: Diagnosis not present

## 2023-07-06 DIAGNOSIS — R7989 Other specified abnormal findings of blood chemistry: Secondary | ICD-10-CM | POA: Insufficient documentation

## 2023-07-06 DIAGNOSIS — I509 Heart failure, unspecified: Principal | ICD-10-CM

## 2023-07-06 DIAGNOSIS — I340 Nonrheumatic mitral (valve) insufficiency: Secondary | ICD-10-CM | POA: Diagnosis not present

## 2023-07-06 DIAGNOSIS — Z043 Encounter for examination and observation following other accident: Secondary | ICD-10-CM | POA: Diagnosis not present

## 2023-07-06 DIAGNOSIS — J90 Pleural effusion, not elsewhere classified: Secondary | ICD-10-CM | POA: Diagnosis not present

## 2023-07-06 DIAGNOSIS — K761 Chronic passive congestion of liver: Secondary | ICD-10-CM | POA: Diagnosis present

## 2023-07-06 DIAGNOSIS — D573 Sickle-cell trait: Secondary | ICD-10-CM | POA: Diagnosis present

## 2023-07-06 DIAGNOSIS — I428 Other cardiomyopathies: Secondary | ICD-10-CM | POA: Diagnosis not present

## 2023-07-06 DIAGNOSIS — F17210 Nicotine dependence, cigarettes, uncomplicated: Secondary | ICD-10-CM | POA: Diagnosis not present

## 2023-07-06 DIAGNOSIS — I5082 Biventricular heart failure: Secondary | ICD-10-CM | POA: Diagnosis present

## 2023-07-06 DIAGNOSIS — I5021 Acute systolic (congestive) heart failure: Secondary | ICD-10-CM | POA: Diagnosis not present

## 2023-07-06 DIAGNOSIS — J45909 Unspecified asthma, uncomplicated: Secondary | ICD-10-CM | POA: Diagnosis present

## 2023-07-06 DIAGNOSIS — R0989 Other specified symptoms and signs involving the circulatory and respiratory systems: Secondary | ICD-10-CM | POA: Diagnosis not present

## 2023-07-06 DIAGNOSIS — Z91048 Other nonmedicinal substance allergy status: Secondary | ICD-10-CM

## 2023-07-06 DIAGNOSIS — Z91018 Allergy to other foods: Secondary | ICD-10-CM

## 2023-07-06 DIAGNOSIS — E876 Hypokalemia: Secondary | ICD-10-CM | POA: Diagnosis not present

## 2023-07-06 DIAGNOSIS — R079 Chest pain, unspecified: Secondary | ICD-10-CM | POA: Diagnosis not present

## 2023-07-06 DIAGNOSIS — R0602 Shortness of breath: Secondary | ICD-10-CM | POA: Diagnosis not present

## 2023-07-06 DIAGNOSIS — Z8673 Personal history of transient ischemic attack (TIA), and cerebral infarction without residual deficits: Secondary | ICD-10-CM

## 2023-07-06 DIAGNOSIS — J9811 Atelectasis: Secondary | ICD-10-CM | POA: Diagnosis not present

## 2023-07-06 DIAGNOSIS — R0600 Dyspnea, unspecified: Secondary | ICD-10-CM | POA: Diagnosis not present

## 2023-07-06 DIAGNOSIS — I110 Hypertensive heart disease with heart failure: Secondary | ICD-10-CM | POA: Diagnosis not present

## 2023-07-06 DIAGNOSIS — R918 Other nonspecific abnormal finding of lung field: Secondary | ICD-10-CM | POA: Diagnosis not present

## 2023-07-06 LAB — HEPATIC FUNCTION PANEL
ALT: 54 U/L — ABNORMAL HIGH (ref 0–44)
AST: 30 U/L (ref 15–41)
Albumin: 3.4 g/dL — ABNORMAL LOW (ref 3.5–5.0)
Alkaline Phosphatase: 35 U/L — ABNORMAL LOW (ref 38–126)
Bilirubin, Direct: 0.3 mg/dL — ABNORMAL HIGH (ref 0.0–0.2)
Indirect Bilirubin: 1.8 mg/dL — ABNORMAL HIGH (ref 0.3–0.9)
Total Bilirubin: 2.1 mg/dL — ABNORMAL HIGH (ref 0.0–1.2)
Total Protein: 6.4 g/dL — ABNORMAL LOW (ref 6.5–8.1)

## 2023-07-06 LAB — ECHOCARDIOGRAM COMPLETE
AV Peak grad: 1.6 mm[Hg]
Ao pk vel: 0.63 m/s
Area-P 1/2: 5.88 cm2
Est EF: <20
S' Lateral: 5.6 cm

## 2023-07-06 LAB — CBC
HCT: 36.6 % (ref 36.0–46.0)
Hemoglobin: 12.4 g/dL (ref 12.0–15.0)
MCH: 29.7 pg (ref 26.0–34.0)
MCHC: 33.9 g/dL (ref 30.0–36.0)
MCV: 87.8 fL (ref 80.0–100.0)
Platelets: 233 10*3/uL (ref 150–400)
RBC: 4.17 MIL/uL (ref 3.87–5.11)
RDW: 12.5 % (ref 11.5–15.5)
WBC: 9.8 10*3/uL (ref 4.0–10.5)
nRBC: 0 % (ref 0.0–0.2)

## 2023-07-06 LAB — HCG, SERUM, QUALITATIVE: Preg, Serum: NEGATIVE

## 2023-07-06 LAB — BASIC METABOLIC PANEL
Anion gap: 10 (ref 5–15)
BUN: 6 mg/dL (ref 6–20)
CO2: 21 mmol/L — ABNORMAL LOW (ref 22–32)
Calcium: 9 mg/dL (ref 8.9–10.3)
Chloride: 107 mmol/L (ref 98–111)
Creatinine, Ser: 0.84 mg/dL (ref 0.44–1.00)
GFR, Estimated: >60 mL/min (ref >60)
Glucose, Bld: 94 mg/dL (ref 70–99)
Potassium: 3.1 mmol/L — ABNORMAL LOW (ref 3.5–5.1)
Sodium: 138 mmol/L (ref 135–145)

## 2023-07-06 LAB — D-DIMER, QUANTITATIVE: D-Dimer, Quant: 1.78 ug{FEU}/mL — ABNORMAL HIGH (ref 0.00–0.50)

## 2023-07-06 LAB — TROPONIN I (HIGH SENSITIVITY)
Troponin I (High Sensitivity): 11 ng/L (ref <18)
Troponin I (High Sensitivity): 10 ng/L (ref <18)

## 2023-07-06 LAB — LACTIC ACID, PLASMA: Lactic Acid, Venous: 1.8 mmol/L (ref 0.5–1.9)

## 2023-07-06 LAB — HIV ANTIBODY (ROUTINE TESTING W REFLEX): HIV Screen 4th Generation wRfx: NONREACTIVE

## 2023-07-06 LAB — BRAIN NATRIURETIC PEPTIDE: B Natriuretic Peptide: 1808.6 pg/mL — ABNORMAL HIGH (ref 0.0–100.0)

## 2023-07-06 LAB — LIPASE, BLOOD: Lipase: 43 U/L (ref 11–51)

## 2023-07-06 MED ORDER — POTASSIUM CHLORIDE CRYS ER 20 MEQ PO TBCR
40.0000 meq | EXTENDED_RELEASE_TABLET | Freq: Once | ORAL | Status: AC
  Administered 2023-07-06: 40 meq via ORAL
  Filled 2023-07-06: qty 2

## 2023-07-06 MED ORDER — IPRATROPIUM-ALBUTEROL 0.5-2.5 (3) MG/3ML IN SOLN
3.0000 mL | Freq: Once | RESPIRATORY_TRACT | Status: AC
  Administered 2023-07-06: 3 mL via RESPIRATORY_TRACT
  Filled 2023-07-06: qty 3

## 2023-07-06 MED ORDER — PERFLUTREN LIPID MICROSPHERE
1.0000 mL | INTRAVENOUS | Status: AC | PRN
  Administered 2023-07-06: 2 mL via INTRAVENOUS

## 2023-07-06 MED ORDER — MELATONIN 5 MG PO TABS
5.0000 mg | ORAL_TABLET | Freq: Every day | ORAL | Status: DC
  Administered 2023-07-06 – 2023-07-12 (×7): 5 mg via ORAL
  Filled 2023-07-06 (×7): qty 1

## 2023-07-06 MED ORDER — IOHEXOL 350 MG/ML SOLN
50.0000 mL | Freq: Once | INTRAVENOUS | Status: AC | PRN
  Administered 2023-07-06: 50 mL via INTRAVENOUS

## 2023-07-06 MED ORDER — ACETAMINOPHEN 325 MG PO TABS
650.0000 mg | ORAL_TABLET | Freq: Four times a day (QID) | ORAL | Status: DC | PRN
  Administered 2023-07-06 – 2023-07-13 (×12): 650 mg via ORAL
  Filled 2023-07-06 (×14): qty 2

## 2023-07-06 MED ORDER — FUROSEMIDE 10 MG/ML IJ SOLN
40.0000 mg | Freq: Once | INTRAMUSCULAR | Status: AC
  Administered 2023-07-06: 40 mg via INTRAVENOUS
  Filled 2023-07-06: qty 4

## 2023-07-06 NOTE — ED Triage Notes (Signed)
 Pt c/o midsternal CP that started three days ago. Pt states when the pain started she was helping her father move and had a box hit her in the chest. Pt states that she is having new orthopnea since then.

## 2023-07-06 NOTE — ED Notes (Signed)
 ED TO INPATIENT HANDOFF REPORT  ED Nurse Name and Phone #: [phone]  S Name/Age/Gender Amber Franco 53 y.o. female Room/Bed: 004C/004C  Code Status   Code Status: Full Code  Home/SNF/Other Home Patient oriented to: self, place, time, and situation Is this baseline? No   Triage Complete: Triage complete  Chief Complaint Dyspnea [R06.00]  Triage Note Pt c/o midsternal CP that started three days ago. Pt states when the pain started she was helping her father move and had a box hit her in the chest. Pt states that she is having new orthopnea since then.    Allergies Allergies  Allergen Reactions   Strawberry (Diagnostic) Anaphylaxis   Tape Other (See Comments)    Leaves marks on skin    Level of Care/Admitting Diagnosis ED Disposition     ED Disposition  Admit   Condition  --   Comment  Hospital Area: [facility] [[id number]]  Level of Care: Telemetry Medical [104]  May admit patient to [facility] or [facility] if equivalent level of care is available:: No  Covid Evaluation: Asymptomatic - no recent exposure (last 10 days) testing not required  Diagnosis: Dyspnea [241871]  Admitting Physician: JEANELLE LAYMAN CROME [[id number]]  Attending Physician: JEANELLE LAYMAN CROME [[id number]]  Certification:: I certify this patient will need inpatient services for at least 2 midnights  Expected Medical Readiness: 07/09/2023          B Medical/Surgery History Past Medical History:  Diagnosis Date   Allergy    Hypertension    Sickle cell trait (HCC)    Stroke [facility])    Past Surgical History:  Procedure Laterality Date   TUBAL LIGATION       A IV Location/Drains/Wounds Patient Lines/Drains/Airways Status     Active Line/Drains/Airways     Name Placement date Placement time Site Days   Peripheral IV 07/06/23 20 G Right Antecubital 07/06/23  1316  Antecubital  less than 1            Intake/Output Last 24 hours No intake or output data in the 24 hours  ending 07/06/23 1807  Labs/Imaging Results for orders placed or performed during the hospital encounter of 07/06/23 (from the past 48 hours)  Basic metabolic panel     Status: Abnormal   Collection Time: 07/06/23  8:23 AM  Result Value Ref Range   Sodium 138 135 - 145 mmol/L   Potassium 3.1 (L) 3.5 - 5.1 mmol/L   Chloride 107 98 - 111 mmol/L   CO2 21 (L) 22 - 32 mmol/L   Glucose, Bld 94 70 - 99 mg/dL    Comment: Glucose reference range applies only to samples taken after fasting for at least 8 hours.   BUN 6 6 - 20 mg/dL   Creatinine, Ser 9.15 0.44 - 1.00 mg/dL   Calcium  9.0 8.9 - 10.3 mg/dL   GFR, Estimated >39 >39 mL/min    Comment: (NOTE) Calculated using the CKD-EPI Creatinine Equation (2021)    Anion gap 10 5 - 15    Comment: Performed at [facility], [address], [place], [place] [address]  CBC     Status: None   Collection Time: 07/06/23  8:23 AM  Result Value Ref Range   WBC 9.8 4.0 - 10.5 K/uL   RBC 4.17 3.87 - 5.11 MIL/uL   Hemoglobin 12.4 12.0 - 15.0 g/dL   HCT 63.3 63.9 - 53.9 %   MCV 87.8 80.0 - 100.0 fL  MCH 29.7 26.0 - 34.0 pg   MCHC 33.9 30.0 - 36.0 g/dL   RDW 87.4 88.4 - 84.4 %   Platelets 233 150 - 400 K/uL   nRBC 0.0 0.0 - 0.2 %    Comment: Performed at [facility], [address], [place], [place] [address]  Troponin I (High Sensitivity)     Status: None   Collection Time: 07/06/23  8:23 AM  Result Value Ref Range   Troponin I (High Sensitivity) 11 <18 ng/L    Comment: (NOTE) Elevated high sensitivity troponin I (hsTnI) values and significant  changes across serial measurements may suggest ACS but many other  chronic and acute conditions are known to elevate hsTnI results.  Refer to the Links section for chest pain algorithms and additional  guidance. Performed at [facility], [address], [place], [place] [address]   hCG, serum, qualitative     Status: None   Collection Time: 07/06/23  8:23 AM  Result Value Ref  Range   Preg, Serum NEGATIVE NEGATIVE    Comment:        THE SENSITIVITY OF THIS METHODOLOGY IS >10 mIU/mL. Performed at [facility], [address] [place], [place] [address]   Troponin I (High Sensitivity)     Status: None   Collection Time: 07/06/23 10:50 AM  Result Value Ref Range   Troponin I (High Sensitivity) 10 <18 ng/L    Comment: (NOTE) Elevated high sensitivity troponin I (hsTnI) values and significant  changes across serial measurements may suggest ACS but many other  chronic and acute conditions are known to elevate hsTnI results.  Refer to the Links section for chest pain algorithms and additional  guidance. Performed at [facility], [address] [place], [place] [address]   Hepatic function panel     Status: Abnormal   Collection Time: 07/06/23 10:50 AM  Result Value Ref Range   Total Protein 6.4 (L) 6.5 - 8.1 g/dL   Albumin 3.4 (L) 3.5 - 5.0 g/dL   AST 30 15 - 41 U/L   ALT 54 (H) 0 - 44 U/L   Alkaline Phosphatase 35 (L) 38 - 126 U/L   Total Bilirubin 2.1 (H) 0.0 - 1.2 mg/dL   Bilirubin, Direct 0.3 (H) 0.0 - 0.2 mg/dL   Indirect Bilirubin 1.8 (H) 0.3 - 0.9 mg/dL    Comment: Performed at [facility], [address], [place], [place] [address]  Lipase, blood     Status: None   Collection Time: 07/06/23 10:50 AM  Result Value Ref Range   Lipase 43 11 - 51 U/L    Comment: Performed at [facility], [address] [place], [place] [address]  Brain natriuretic peptide     Status: Abnormal   Collection Time: 07/06/23 10:50 AM  Result Value Ref Range   B Natriuretic Peptide 1,808.6 (H) 0.0 - 100.0 pg/mL    Comment: Performed at [facility], [address] [place], [place] [address]   CT CHEST W CONTRAST Result Date: 07/06/2023 CLINICAL DATA:  Clemens down stairs. EXAM: CT CHEST WITH CONTRAST TECHNIQUE: Multidetector CT imaging of the chest was performed during intravenous contrast administration. RADIATION DOSE REDUCTION: This  exam was performed according to the departmental dose-optimization program which includes automated exposure control, adjustment of the mA and/or kV according to patient size and/or use of iterative reconstruction technique. CONTRAST:  50mL OMNIPAQUE  IOHEXOL  350 MG/ML SOLN COMPARISON:  None Available. FINDINGS: Cardiovascular: Heart is mildly enlarged. Aorta is normal in size. There is no pericardial effusion. Mediastinum/Nodes: Twos 1 no evidence for pneumomediastinum or mediastinal hematoma. Lungs/Pleura: There are small bilateral pleural effusions. There are atelectatic changes in the bilateral lower lobes. There are minimal ground-glass opacities in the bilateral lower lobes. There is no pneumothorax. Trachea and central airways are patent. Upper Abdomen: No acute abnormality. Musculoskeletal: No fracture is seen. No focal hematoma. There is an intramuscular lipoma along the posterior left chest wall. IMPRESSION: 1. Small bilateral pleural effusions with atelectatic changes in the bilateral lower lobes. 2. Minimal ground-glass opacities in the bilateral lower lobes favored as edema. Infection not excluded. 3. Mild cardiomegaly. 4. No evidence for fracture or focal hematoma. Electronically Signed   By: Greig Pique M.D.   On: 07/06/2023 17:40   ECHOCARDIOGRAM COMPLETE Result Date: 07/06/2023    ECHOCARDIOGRAM REPORT   Patient Name:   Karee Cahue Date of Exam: 07/06/2023 Medical Rec #:  [id number]   Height:       65.0 in Accession #:    [id number]  Weight:       197.1 lb Date of Birth:  [birth year]   BSA:          1.966 m Patient Age:    52 years    BP:           127/98 mmHg Patient Gender: F           HR:           104 bpm. Exam Location:  Inpatient Procedure: 2D Echo, Cardiac Doppler, Color Doppler and Intracardiac            Opacification Agent Indications:    Blunt Chest Trauma  History:        Patient has prior history of Echocardiogram examinations and                 Patient has no prior history of  Echocardiogram examinations.  Sonographer:    Jayson Gaskins Referring Phys: [id number] MARSHALL L CHAMBLISS IMPRESSIONS  1. Left ventricular ejection fraction, by estimation, is <20%. The left ventricle has severely decreased function. The left ventricle demonstrates global hypokinesis. The left ventricular internal cavity size was mildly to moderately dilated. Left ventricular diastolic parameters are indeterminate.  2. Definity  contrast demonstrates slow, swirling flow at the LV apex without formed thrombus.  3. RV-RA gradient 31 mmHg suggesting at least mildly elevated estimated RVSP depending on CVP. Right ventricular systolic function is severely reduced. The right ventricular size is mildly enlarged.  4. The mitral valve is grossly normal. Moderate mitral valve regurgitation, possibly related to annular dilatation.  5. The aortic valve is tricuspid. Aortic valve regurgitation is trivial.  6. Unable to estimate CVP. Comparison(s): No prior Echocardiogram. FINDINGS  Left Ventricle: Left ventricular ejection fraction, by estimation, is <20%. The left ventricle has severely decreased function. The left ventricle demonstrates global hypokinesis. The left ventricular internal cavity size was mildly to moderately dilated. There is no left ventricular hypertrophy. Left ventricular diastolic parameters are indeterminate. Right Ventricle: RV-RA gradient 31 mmHg suggesting at least mildly elevated estimated RVSP depending on CVP. The right ventricular size is mildly enlarged. No increase in right ventricular wall thickness. Right ventricular systolic function is severely reduced. Left Atrium: Left atrial size was normal in size. Right Atrium: Right atrial size was normal in size. Pericardium: There is no evidence of pericardial effusion. Mitral Valve: The mitral valve is grossly normal. Moderate mitral  valve regurgitation. Tricuspid Valve: The tricuspid valve is grossly normal. Tricuspid valve regurgitation is mild. Aortic  Valve: The aortic valve is tricuspid. Aortic valve regurgitation is trivial. Aortic valve peak gradient measures 1.6 mmHg. Pulmonic Valve: The pulmonic valve was not well visualized. Pulmonic valve regurgitation is trivial. Aorta: The aortic root is normal in size and structure. Venous: Unable to estimate CVP. The inferior vena cava was not well visualized. IAS/Shunts: No atrial level shunt detected by color flow Doppler.  LEFT VENTRICLE PLAX 2D LVIDd:         5.90 cm LVIDs:         5.60 cm LV PW:         0.80 cm LV IVS:        0.80 cm  RIGHT VENTRICLE RV S prime:     8.05 cm/s TAPSE (M-mode): 1.0 cm LEFT ATRIUM           Index        RIGHT ATRIUM           Index LA Vol (A2C): 65.6 ml 33.37 ml/m  RA Area:     17.10 cm LA Vol (A4C): 27.6 ml 14.04 ml/m  RA Volume:   48.80 ml  24.82 ml/m  AORTIC VALVE AV Vmax:      63.00 cm/s AV Peak Grad: 1.6 mmHg LVOT Vmax:    50.30 cm/s LVOT Vmean:   39.100 cm/s LVOT VTI:     0.071 m MITRAL VALVE                TRICUSPID VALVE MV Area (PHT): 5.88 cm     TR Peak grad:   31.4 mmHg MV Decel Time: 129 msec     TR Vmax:        280.00 cm/s MV E velocity: 116.00 cm/s                             SHUNTS                             Systemic VTI: 0.07 m Jayson Sierras MD Electronically signed by Jayson Sierras MD Signature Date/Time: 07/06/2023/2:22:09 PM    Final    DG Chest 2 View Result Date: 07/06/2023 CLINICAL DATA:  Chest pain with shortness of breath for 4 days. EXAM: CHEST - 2 VIEW COMPARISON:  Radiographs 05/16/2015.  Abdominal CT 10/07/2017. FINDINGS: Slightly lower lung volumes. New mild cardiac enlargement. There is vascular congestion with probable mild pulmonary edema and fissural thickening. Asymmetric patchy opacity in the left lower lobe likely represents atelectasis based on appearance on the lateral view. There is no confluent airspace disease or significant pleural effusion. No pneumothorax. The bones appear unremarkable. IMPRESSION: 1. New mild cardiac enlargement  with vascular congestion and probable mild pulmonary edema. Findings suggest mild congestive heart failure. Atypical infection considered less likely. Correlate clinically. 2. Asymmetric patchy opacity in the left lower lobe likely represents atelectasis. Electronically Signed   By: Elsie Perone M.D.   On: 07/06/2023 09:13    Pending Labs Unresulted Labs (From admission, onward)     Start     Ordered   07/07/23 0500  Comprehensive metabolic panel  Tomorrow morning,   R        07/06/23 1504   07/07/23 0500  CBC  Tomorrow morning,   R        07/06/23 1504  07/06/23 1659  D-dimer, quantitative  Once,   R        07/06/23 1658   07/06/23 1646  Lactic acid, plasma  (Lactic Acid)  Once,   R        07/06/23 1645   07/06/23 1501  HIV Antibody (routine testing w rflx)  (HIV Antibody (Routine testing w reflex) panel)  Once,   R        07/06/23 1504            Vitals/Pain Today's Vitals   07/06/23 1200 07/06/23 1515 07/06/23 1530 07/06/23 1745  BP: (!) 127/98 (!) 112/91 (!) 108/94 98/75  Pulse: (!) 107 (!) 104 (!) 104 (!) 104  Resp: (!) 37 18  16  Temp:  (!) 97.3 F (36.3 C)    TempSrc:  Oral    SpO2: 95% 95% 97% 98%  PainSc:        Isolation Precautions No active isolations  Medications Medications  perflutren  lipid microspheres (DEFINITY ) IV suspension (2 mLs Intravenous Given 07/06/23 1408)  acetaminophen  (TYLENOL ) tablet 650 mg (has no administration in time range)  ipratropium-albuterol  (DUONEB) 0.5-2.5 (3) MG/3ML nebulizer solution 3 mL (3 mLs Nebulization Given 07/06/23 1052)  potassium chloride  SA (KLOR-CON  M) CR tablet 40 mEq (40 mEq Oral Given 07/06/23 1052)  furosemide  (LASIX ) injection 40 mg (40 mg Intravenous Given 07/06/23 1316)  potassium chloride  SA (KLOR-CON  M) CR tablet 40 mEq (40 mEq Oral Given 07/06/23 1722)  iohexol  (OMNIPAQUE ) 350 MG/ML injection 50 mL (50 mLs Intravenous Contrast Given 07/06/23 1729)    Mobility walks     Focused Assessments Cardiac  Assessment Handoff:    No results found for: CKTOTAL, CKMB, CKMBINDEX, TROPONINI No results found for: DDIMER Does the Patient currently have chest pain? No    R Recommendations: See Admitting Provider Note  Report given to:   Additional Notes:

## 2023-07-06 NOTE — Assessment & Plan Note (Signed)
 AST/ALT 30/54. Tbili 2.1. No alcohol use. Possible related to vascular congestion. -AM CMP

## 2023-07-06 NOTE — Plan of Care (Signed)
 FMTS Interim Progress Note  S: Went to bedside with Dr. Joshua to assess patient. She was resting in bed. Continues to express having the same L sided chest pain she was admitted with with no worsening or radiation of pain. She reports that certain positions help manage her symptoms better. Endorses some shortness of breath at this time as well. Otherwise has no other concerns.   O: BP (!) 112/92 (BP Location: Left Arm)   Pulse 98   Temp 97.7 F (36.5 C) (Oral)   Resp 20   Wt 81 kg   SpO2 95%   BMI 29.70 kg/m   General: Awake and Alert in NAD HEENT: NCAT. Sclera anicteric. No rhinorrhea. Mild JVD.  Cardiovascular: RRR. Distant heart sounds. Warm and well perfused extremities. 2+ radial pulses. Respiratory: CTAB, normal WOB on RA. No wheezing, crackles, rhonchi, or diminished breath sounds. TTP over L anterior chest. Extremities: No BLE edema, no deformities or significant joint findings. Skin: Warm and dry.  Neuro: A&Ox3. No focal neurological deficits.  A/P: CHF S/p IV Lasix  40 mg, symptoms improved from admission, but are starting to increase again. Discussed that it would be reasonable to give her another dose of Lasix  at this time due to her having increased SOB and JVD present on exam. Patient would like to hold off till the morning due to the likelihood of increased urination overnight. This is reasonable. Advised patient to call nurse if her SOB or WOB worsens, patient understood. - Continue to monitor - Consider another dose of Lasix  since BP has remained stable  Rest of plan per H&P.   Janna Ferrier, DO 07/06/2023, 11:56 PM PGY-1, [facility] Family Medicine Service pager [phone]

## 2023-07-06 NOTE — Plan of Care (Signed)
 FMTS Interim Progress Note  Called and spoke with on call cardiology MD Dr. Michele. Echocardiogram with EF <20%, LV global hypokinesis + dilation, LV apex swirling flow without formed thrombus, RV systolic function severely reduced + mildly enlarged, mod MVR  BNP 1808 Symptomatic with extreme DOE and orthopnea Hx of falling on plastic tub from [employer] Plan for cardiology to consult for new onset HF  Will also order Chest CT for evaluation after trauma Unlikely aortic dissection given 4 days after trauma   Christia Budds, MD 07/06/2023, 3:01 PM PGY-3, [facility] Family Medicine Service pager [phone]

## 2023-07-06 NOTE — Assessment & Plan Note (Signed)
 BNP 1808. Echocardiogram with EF of less than 20% with global LV hypokinesis, LV apex swirling and severely reduced RV function as well as moderate MVR.  Concern for Takotsubo cardiomyopathy versus myocardial contusion. -Admit to FMTS, progressive, attending Dr. Jeanelle -Cardiology consulted, appreciate recommendations -Strict I's/O's -Daily weights -Keep K>4, Mag >2 -CT chest with contrast to evaluate given hx blunt trauma to chest -Monitor blood pressure closely and for signs of cardiogenic shock -Keep O2 saturations greater than 92% -AM CMP, Mag, CBC

## 2023-07-06 NOTE — Consult Note (Addendum)
 Cardiology Consultation   Patient ID: Amber Franco MRN: [id number]; DOB: Aug 16, 1970  Admit date: 07/06/2023 Date of Consult: 07/06/2023  PCP:  Forest Coy, MD   [facility] Providers Cardiologist: New to HeartCare  Patient Profile:   Amber Franco is a 53 y.o. female with a hx of HTN and sickle cell trait who is being seen 07/06/2023 for the evaluation of new cardiomyopathy with EF < 20% at the request of Dr. Jeanelle.  History of Present Illness:   [name] [facility] this morning for evaluation of chest pain and shortness of breath for the past 4 days. In talking with the patient today, she reports being under significant stress as she moved from [place]  to [place]  3 weeks prior to Thanksgiving to be closer to her dad who had terminal cancer. He passed away in the beginning of December. Unfortunately, her husband passed away from a presumed heart attack last week and she found him at home. She reports she was having shortness of breath including dyspnea on exertion and orthopnea prior to both family members passing away.  Says that she was previously very healthy when in [place]  and was exercising routinely and felt well with this. Now she gets short of breath when walking from room to room. Has also noticed lower extremity edema. She does report having chest pain but this occurred in the setting of falling on a plastic tub and pain has been reproducible along her left upper quadrant. This is not associated with exertion. She has orthopnea and can't lie flat, and also PND gasping for air and waking up frequently.  She denies any personal history of CAD, CHF or cardiac arrhythmias. No prior tobacco use, alcohol use or recreational drug use.  Initial labs WBC 9.8, Hgb 12.4, platelets 233, Na+ 138, K+ 3.1 and creatinine 0.84. AST 30 and ALT 54. BNP elevated at 1808. Initial and repeat hs Troponin negative at 11 and 10. CXR shows mild cardiac enlargement  with vascular congestion and probable mild pulmonary edema. Also noted to have asymmetric patchy opacity in the left lower lobe which likely represents atelectasis. CT Chest pending. EKG shows sinus tachycardia, heart rate 114 with T wave inversion along the inferior and lateral leads. Echocardiogram was obtained today which shows her EF is less than 20% with global hypokinesis. Also noted to have slow, swirling flow at the LV apex without formed thrombus. RV function also severely reduced and noted to have moderate MR.  She received IV Lasix  40 mg while in the ED and reports she has urinated at least 5-6 times thus far. Reports her breathing is already starting to improve.  Past Medical History:  Diagnosis Date   Allergy    Hypertension    Sickle cell trait (HCC)    Stroke [facility])     Past Surgical History:  Procedure Laterality Date   TUBAL LIGATION       Home Medications:  Prior to Admission medications   Medication Sig Start Date End Date Taking? Authorizing Provider  baclofen  (LIORESAL ) 10 MG tablet Take 10 mg by mouth at bedtime as needed for muscle spasms.   Yes [provider]  nortriptyline (PAMELOR) 10 MG capsule Take 10 mg by mouth at bedtime.   Yes [provider]  traZODone (DESYREL) 50 MG tablet Take 50 mg by mouth at bedtime as needed for sleep. Patient not taking: Reported on 07/06/2023    [provider]    Inpatient Medications: Scheduled  Meds:   Continuous Infusions:  PRN Meds: acetaminophen   Allergies:    Allergies  Allergen Reactions   Strawberry (Diagnostic) Anaphylaxis   Tape Other (See Comments)    Leaves marks on skin    Social History:   Social History   Socioeconomic History   Marital status: Single    Spouse name: Not on file   Number of children: Not on file   Years of education: Not on file   Highest education level: Not on file  Occupational History   Not on file  Tobacco Use   Smoking status: Every Day     Current packs/day: 0.25    Types: Cigarettes   Smokeless tobacco: Never   Tobacco comments:    2 per day . Increase x 1 month now back down  Substance and Sexual Activity   Alcohol use: No   Drug use: No   Sexual activity: Not on file  Other Topics Concern   Not on file  Social History Narrative   Not on file   Social Drivers of Health   Financial Resource Strain: Low Risk  (06/14/2019)   Received from [facility] and [employer], Lobbyist and Chief Technology Officer   Overall Physicist, Medical Strain (CARDIA)    Difficulty of Paying Living Expenses: Not hard at all  Food Insecurity: No Food Insecurity (06/14/2019)   Received from [employer] and [employer], Lobbyist and [employer] Vital Sign    Worried About Programme Researcher, Broadcasting/film/video in the Last Year: Never true    [employer] of Food in the Last Year: Never true  Transportation Needs: No Transportation Needs (06/14/2019)   Received from [employer] and [employer], Lobbyist and Chief Technology Officer   [employer] - Administrator, Civil Service (Medical): No    Lack of Transportation (Non-Medical): No  Physical Activity: Inactive (06/14/2019)   Received from [facility] and [employer], [facility] and [employer]   Exercise Vital Sign    Days of Exercise per Week: 0 days    Minutes of Exercise per Session: 0 min  Stress: No Stress Concern Present (06/14/2019)   Received from [facility] and [employer], Lobbyist and [employer] of Occupational Health - Occupational Stress Questionnaire    Feeling of Stress : Not at all  Social Connections: Unknown (06/14/2019)   Received from [employer] and [employer], Lobbyist and [employer]   Social Connection and Isolation Panel [NHANES]    Frequency of Communication with Friends and Family: Three times a week    Frequency of Social Gatherings with Friends and Family: Twice a week    Attends Religious Services: Never    Database Administrator or Organizations: No    Attends Banker Meetings: Never    Marital Status: Not on file  Intimate Partner Violence: Not At Risk (06/14/2019)   Received from [employer] and [employer], Lobbyist and [employer], Afraid, Rape, and Kick questionnaire    Fear of Current or Ex-Partner: No    Emotionally Abused: No    Physically Abused: No    Sexually Abused: No    Family History:    Family History  Problem Relation Age of Onset  Cancer Mother    Hypertension Father    Stroke Father      ROS:  Please see the history of present illness.   All other ROS reviewed and negative.     Physical Exam/Data:   Vitals:   07/06/23 1200 07/06/23 1515 07/06/23 1530 07/06/23 1745  BP: (!) 127/98 (!) 112/91 (!) 108/94 98/75  Pulse: (!) 107 (!) 104 (!) 104 (!) 104  Resp: (!) 37 18  16  Temp:  (!) 97.3 F (36.3 C)    TempSrc:  Oral    SpO2: 95% 95% 97% 98%   No intake or output data in the 24 hours ending 07/06/23 1908    09/15/2017    3:40 PM 07/09/2017    2:17 PM 05/12/2017    2:52 PM  Last 3 Weights  Weight (lbs) 197 lb 1.6 oz 195 lb 14.4 oz 199 lb 8 oz  Weight (kg) 89.404 kg 88.86 kg 90.493 kg     There is no height or weight on file to calculate BMI.  General: Pleasant female appearing in no acute distress. HEENT: normal Neck: JVD at at least 15 cm.  Vascular: No carotid bruits; Distal pulses 2+ bilaterally Cardiac:  normal S1, S2; regular rhythm, tachycardic rate Lungs: Rales along bases bilaterally. Abd: soft, nontender, no hepatomegaly  Ext: Trace lower  extremity edema bilaterally. Musculoskeletal:  No deformities, BUE and BLE strength normal and equal Skin: warm and dry  Neuro:  CNs 2-12 intact, no focal abnormalities noted Psych:  Normal affect   EKG:  The EKG was personally reviewed and demonstrates: Sinus tachycardia, heart rate 114 with T wave inversion along the inferior and lateral leads Telemetry:  Telemetry was personally reviewed and demonstrates: Not currently connected.  Relevant CV Studies:  Echocardiogram: 07/06/2023 IMPRESSIONS     1. Left ventricular ejection fraction, by estimation, is <20%. The left  ventricle has severely decreased function. The left ventricle demonstrates  global hypokinesis. The left ventricular internal cavity size was mildly  to moderately dilated. Left  ventricular diastolic parameters are indeterminate.   2. Definity  contrast demonstrates slow, swirling flow at the LV apex  without formed thrombus.   3. RV-RA gradient 31 mmHg suggesting at least mildly elevated estimated  RVSP depending on CVP. Right ventricular systolic function is severely  reduced. The right ventricular size is mildly enlarged.   4. The mitral valve is grossly normal. Moderate mitral valve  regurgitation, possibly related to annular dilatation.   5. The aortic valve is tricuspid. Aortic valve regurgitation is trivial.   6. Unable to estimate CVP.   Comparison(s): No prior Echocardiogram.   Laboratory Data:  High Sensitivity Troponin:   Recent Labs  Lab 07/06/23 0823 07/06/23 1050  TROPONINIHS 11 10     Chemistry Recent Labs  Lab 07/06/23 0823  NA 138  K 3.1*  CL 107  CO2 21*  GLUCOSE 94  BUN 6  CREATININE 0.84  CALCIUM  9.0  GFRNONAA >60  ANIONGAP 10    Recent Labs  Lab 07/06/23 1050  PROT 6.4*  ALBUMIN 3.4*  AST 30  ALT 54*  ALKPHOS 35*  BILITOT 2.1*   Lipids No results for input(s): CHOL, TRIG, HDL, LABVLDL, LDLCALC, CHOLHDL in the last 168 hours.  Hematology Recent Labs   Lab 07/06/23 0823  WBC 9.8  RBC 4.17  HGB 12.4  HCT 36.6  MCV 87.8  MCH 29.7  MCHC 33.9  RDW 12.5  PLT 233   Thyroid No results for input(s):  TSH, FREET4 in the last 168 hours.  BNP Recent Labs  Lab 07/06/23 1050  BNP 1,808.6*    DDimer  Recent Labs  Lab 07/06/23 1743  DDIMER 1.78*     Radiology/Studies:    DG Chest 2 View Result Date: 07/06/2023 CLINICAL DATA:  Chest pain with shortness of breath for 4 days. EXAM: CHEST - 2 VIEW COMPARISON:  Radiographs 05/16/2015.  Abdominal CT 10/07/2017. FINDINGS: Slightly lower lung volumes. New mild cardiac enlargement. There is vascular congestion with probable mild pulmonary edema and fissural thickening. Asymmetric patchy opacity in the left lower lobe likely represents atelectasis based on appearance on the lateral view. There is no confluent airspace disease or significant pleural effusion. No pneumothorax. The bones appear unremarkable. IMPRESSION: 1. New mild cardiac enlargement with vascular congestion and probable mild pulmonary edema. Findings suggest mild congestive heart failure. Atypical infection considered less likely. Correlate clinically. 2. Asymmetric patchy opacity in the left lower lobe likely represents atelectasis. Electronically Signed   By: Elsie Perone M.D.   On: 07/06/2023 09:13     Assessment and Plan:   1. Acute HFrEF/Biventricular Failure - As outlined above, her story is concerning for Takotsubo cardiomyopathy as her father and husband have both passed away within the past 30 days but she actually developed dyspnea on exertion and orthopnea prior to this. BNP elevated 1808 and echocardiogram shows EF less than 20% with slow flow at LV apex without thrombus. RV function also severely reduced. - She has received IV Lasix  40 mg and has responded well thus far per her report with improvement in her respiratory status. She will eventually require a R/LHC once orthopnea improves as she is unable to lie flat  currently. She also has a very narrow pulse pressure and will check a lactic acid as I am concerned she could be in shock. Will review with MD but will likely need AHF evaluation. Add GDMT as BP and renal function allow.  -She is not in cardiogenic shock this evening with normal lactate -Advanced HF to see tomorrow -Can continue IV diuresis overnight 1/5 evening  2. Mitral Valve Regurgitation - This was moderate by echocardiogram this admission. Likely functional due to severe LV dysfunction. Continue to follow.  3. Elevated LFT's - ALT at 54. AST normal. Continue to follow.  4. Hypokalemia - K+ was at 3.1 on admission and supplementation has been ordered. Replace to keep ~ 4.0.  Risk Assessment/Risk Scores:    [place]  Heart Association (NYHA) Functional Class NYHA Class IV  For questions or updates, please contact [facility] Please consult [email or web address] for contact info under    Signed, Prentice FORBES Netters, MD  07/06/2023 7:08 PM

## 2023-07-06 NOTE — H&P (Addendum)
 Hospital Admission History and Physical Service Pager: [phone]  Patient name: Amber Franco Medical record number: [id number] Date of Birth: 06/01/1971 Age: 53 y.o. Gender: female  Primary Care Provider: Forest Coy, MD Consultants: Cardiology Code Status: FULL  Preferred Emergency Contact: Bernarda Calamity [phone])  Chief Complaint: Chest pain   Assessment and Plan: Falan Hensler is a 53 y.o. female PMH remote hx of HTN (not on meds), chronic back pain presenting with chest pain, orthopnea and dyspnea on exertion after fall onto plastic tab 4 days ago and recent family members passing. Differential for presentation of this includes:   New onset HF-confirmed given echocardiogram findings of EF less than 20% Takotsubo's cardiomyopathy-possible given new onset heart failure and recent stressors Myocardial contusion-possible given blunt trauma to the chest LV thrombus-possible given swirling on LV apex however this is also seen in very low EF Aortic dissection-unlikely given length of symptoms PE-possible however no recent long flights, no unilateral leg swelling; LR Well's score (1.3%) ACS-less likely given anterior chest wall tenderness to palpation and troponin negative and flat, EKG sinus tachycardia Assessment & Plan New onset of congestive heart failure (HCC) BNP 1808. Echocardiogram with EF of less than 20% with global LV hypokinesis, LV apex swirling and severely reduced RV function as well as moderate MVR.  Concern for Takotsubo cardiomyopathy versus myocardial contusion. -Admit to FMTS, progressive, attending Dr. Jeanelle -Cardiology consulted, appreciate recommendations -Strict I's/O's -Daily weights -Keep K>4, Mag >2 -CT chest with contrast to evaluate given hx blunt trauma to chest -Monitor blood pressure closely and for signs of cardiogenic shock -Keep O2 saturations greater than 92% -AM CMP, Mag, CBC LFT elevation AST/ALT 30/54. Tbili 2.1. No alcohol  use. Possible related to vascular congestion. -AM CMP  Chronic and Stable Problems:  Sleep disturbances-she is unsure what medication she takes at home, ordering melatonin nightly for now History of hypertension-states that she is now off of all medications due to improved lifestyle Back pain-add home flexeril  PRN if needing ?CVA hx-states that she has previously had a stroke in 2018 however based on documentation from neurology 10/15/2016 there was no objective evidence of acute neurologic deficits or previous strokes and MRI brain negative. She states this event of aphasia happened after her daughter died from an overdose of Molly  FEN/GI: Heart diet  VTE Prophylaxis: SCDs  Disposition: Progressive  History of Present Illness:  Amber Franco is a 53 y.o. female presenting with 4 days of significant dyspnea on exertion, anterior chest pain and significant orthopnea. Patient states that she has had many recent stressors including her father passing away on December 20th and husband recently passed away on 22-Jul-2024. 4 days ago (~1/1) she was trying to move the clothes out of her room to help with her mental health and after putting it in a big plastic top from [facility] when she was going downstairs she fell and her chest hit the tub.  She denies any loss of consciousness.  Denies any symptoms prior to the event like dizziness/lightheadedness that she can remember.  Denies any head trauma as she states the plastic tub stopped her fall.  After that event when sleeping on left side she felt like heart was coming out of her chest. In the last 5 days she has been needing to use 6-7 pillows to lay down flat.  Short of breath with walking short distances whereas previously she was very active and mobile. She was also felt like her heart has been intermittently racing.  She has not been able to sleep in 5 days due to her significant orthopnea.  States that she thought it was anxiety but knew that it was  not getting better came to the hospital this morning.  States that currently she has 4/5 chest pain, not bad like it was earlier in the day.  She states that it is left sided and shoots down to her pelvis on the left side.  Denies any radiation to shoulder/arms or back.    Denies any coughing or fevers/sick symptoms.  Moved to [place] from [place]  around 3 weeks before November 2024  In the ED, BNP 1808, CXR suggestive of CHF vs atypical infection. K 3.1 on arrival, repleted. Patient given 40 IV lasix , duonebs x1.   Review Of Systems: Per HPI   Pertinent Past Medical History: Sickle cell trait Stroke (2018?) HTN--off meds for awhile [place] off horse a while ago 5 years ago with back pain after that Remainder reviewed in history tab.   Pertinent Past Surgical History: Tubal ligation  Scalp abscess removal Remainder reviewed in history tab.   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   Important Outpatient Medications:  Sleep medicine - she is unclear  Flexeril    Remainder reviewed in medication history.   Objective: BP (!) 125/96 (BP Location: Right Arm)   Pulse (!) 106   Temp 98 F (36.7 C)   Resp 20   SpO2 96%  Exam: General: NAD, awake, alert, responsive to all questions Eyes: Pupils equal, no scleral icterus ENTM: Moist mucous membranes Neck: Mild JVD Cardiovascular: RRR, distant heart sounds, warm and well perfused extremities, 2+ radial and DP pulses Respiratory/Chest: CTAB no w/r/c, no iWOB on RA, sitting upright in bed, tenderness to palpation of left anterior chest wall Gastrointestinal: Soft, mildly tender to palpation over left upper abdomen, no rebound, no brusing MSK: No LE edema Derm: No  wounds/rashes seen Neuro: No focal deficits or aphasia Psych: Mood appropriate, pleasant  Labs:  CBC BMET  Recent Labs  Lab 07/06/23 0823  WBC 9.8  HGB 12.4  HCT 36.6  PLT 233   Recent Labs  Lab 07/06/23 0823  NA 138  K 3.1*  CL 107  CO2 21*  BUN 6  CREATININE 0.84  GLUCOSE 94  CALCIUM  9.0    BNP    Component Value Date/Time   BNP 1,808.6 (H) 07/06/2023 1050   ALT 54 Troponin 10  Lipase 43 hCG negative   EKG: Sinus tachycardia   Imaging Studies Performed:  CXR: Vascular congestion, cardiomegaly, possible LLL opacity   Echo: <20%, LV global hypokinesis + dialtion, LV apex swirling flow without formed thrombus, RV systolic function severely reduced + mildly enlarged, mode MVR   Diona Perkins, MD 07/06/2023, 11:54 AM PGY-1, [facility]  FPTS Intern pager: [phone], text pages welcome Secure chat group [facility] Teaching Service   Upper Level Addendum:  I have seen and evaluated this patient along with Dr. Diona and reviewed the above note, making necessary revisions as appropriate.  I agree with the medical decision making and physical exam as noted above.  Wendel Lesch, MD PGY-3 [facility] Family Medicine Residency

## 2023-07-06 NOTE — ED Provider Notes (Signed)
 [place] EMERGENCY DEPARTMENT AT [facility] Provider Note   CSN: [id number] Arrival date & time: 07/06/23  0750     History  Chief Complaint  Patient presents with   Chest Pain    Amber Franco is a 53 y.o. female with hypertension, stroke, allergies, asthma reports that she has had exertional shortness of breath, orthopnea and chest tightness for 4 days.  Patient reports this started after she was moving her father's of her chart of his house.  Reports she dropped a large tub and fell forward hitting her lower chest and abdomen.  Reports she has been having some exertional shortness of breath prior to this but injury worsened symptoms.  Reports upper abdominal pain, denies any fever, cough or URI-like symptoms nausea vomiting or diarrhea.  Does have history of asthma and allergies however does not feel her symptoms are consistent with asthma, has not had recent issues.    Chest Pain      Home Medications Prior to Admission medications   Medication Sig Start Date End Date Taking? Authorizing Provider  albuterol  (VENTOLIN  HFA) 108 (90 Base) MCG/ACT inhaler Inhale 1 puff into the lungs every 6 (six) hours as needed for wheezing or shortness of breath. 03/31/19   Guilloud, Carolyn, MD  cyclobenzaprine  (FLEXERIL ) 10 MG tablet Take 1 tablet (10 mg total) by mouth 3 (three) times daily as needed for muscle spasms. 03/31/19   Guilloud, Carolyn, MD  diclofenac  (VOLTAREN ) 75 MG EC tablet Take 1 tablet (75 mg total) by mouth 2 (two) times daily. 10/27/17   Burky, Natalie B, NP  fluticasone  (FLONASE ) 50 MCG/ACT nasal spray Place 2 sprays into both nostrils daily. 03/31/19 03/30/20  Guilloud, Carolyn, MD  gabapentin  (NEURONTIN ) 300 MG capsule Take 1 capsule (300 mg total) by mouth 3 (three) times daily. 03/31/19   Guilloud, Carolyn, MD  loratadine  (CLARITIN ) 10 MG tablet Take 1 tablet (10 mg total) by mouth daily as needed for allergies. 09/15/17   Guilloud, Carolyn, MD  ondansetron  (ZOFRAN ) 4  MG tablet Take 1 tablet (4 mg total) by mouth every 8 (eight) hours as needed for nausea or vomiting. 10/07/17   Pisciotta, Nat, PA-C      Allergies    Codeine, Hydrocodone , Strawberry (diagnostic), and Tape    Review of Systems   Review of Systems  Cardiovascular:  Positive for chest pain.    Physical Exam Updated Vital Signs BP (!) 125/96 (BP Location: Right Arm)   Pulse (!) 106   Temp 98 F (36.7 C)   Resp 20   SpO2 96%  Physical Exam Vitals and nursing note reviewed.  Constitutional:      General: She is not in acute distress.    Appearance: She is not toxic-appearing.  HENT:     Head: Normocephalic and atraumatic.  Eyes:     General: No scleral icterus.    Conjunctiva/sclera: Conjunctivae normal.  Cardiovascular:     Rate and Rhythm: Regular rhythm. Tachycardia present.     Pulses: Normal pulses.     Heart sounds: Normal heart sounds.  Pulmonary:     Effort: Pulmonary effort is normal. No respiratory distress.     Breath sounds: Rales present. No wheezing.     Comments: No increased respiratory effort. Abdominal:     General: Abdomen is flat. Bowel sounds are normal.     Palpations: Abdomen is soft.     Tenderness: There is no abdominal tenderness.  Musculoskeletal:     Right lower leg: No  edema.     Left lower leg: No edema.  Skin:    General: Skin is warm and dry.     Findings: No lesion.  Neurological:     General: No focal deficit present.     Mental Status: She is alert and oriented to person, place, and time. Mental status is at baseline.     ED Results / Procedures / Treatments   Labs (all labs ordered are listed, but only abnormal results are displayed) Labs Reviewed  BASIC METABOLIC PANEL - Abnormal; Notable for the following components:      Result Value   Potassium 3.1 (*)    CO2 21 (*)    All other components within normal limits  HEPATIC FUNCTION PANEL - Abnormal; Notable for the following components:   Total Protein 6.4 (*)     Albumin 3.4 (*)    ALT 54 (*)    Alkaline Phosphatase 35 (*)    Total Bilirubin 2.1 (*)    Bilirubin, Direct 0.3 (*)    Indirect Bilirubin 1.8 (*)    All other components within normal limits  BRAIN NATRIURETIC PEPTIDE - Abnormal; Notable for the following components:   B Natriuretic Peptide 1,808.6 (*)    All other components within normal limits  CBC  HCG, SERUM, QUALITATIVE  LIPASE, BLOOD  TROPONIN I (HIGH SENSITIVITY)  TROPONIN I (HIGH SENSITIVITY)    EKG None  Radiology DG Chest 2 View Result Date: 07/06/2023 CLINICAL DATA:  Chest pain with shortness of breath for 4 days. EXAM: CHEST - 2 VIEW COMPARISON:  Radiographs 05/16/2015.  Abdominal CT 10/07/2017. FINDINGS: Slightly lower lung volumes. New mild cardiac enlargement. There is vascular congestion with probable mild pulmonary edema and fissural thickening. Asymmetric patchy opacity in the left lower lobe likely represents atelectasis based on appearance on the lateral view. There is no confluent airspace disease or significant pleural effusion. No pneumothorax. The bones appear unremarkable. IMPRESSION: 1. New mild cardiac enlargement with vascular congestion and probable mild pulmonary edema. Findings suggest mild congestive heart failure. Atypical infection considered less likely. Correlate clinically. 2. Asymmetric patchy opacity in the left lower lobe likely represents atelectasis. Electronically Signed   By: Elsie Perone M.D.   On: 07/06/2023 09:13    Procedures Procedures    Medications Ordered in ED Medications  ipratropium-albuterol  (DUONEB) 0.5-2.5 (3) MG/3ML nebulizer solution 3 mL (3 mLs Nebulization Given 07/06/23 1052)  potassium chloride  SA (KLOR-CON  M) CR tablet 40 mEq (40 mEq Oral Given 07/06/23 1052)    ED Course/ Medical Decision Making/ A&P                                 Medical Decision Making Amount and/or Complexity of Data Reviewed Labs: ordered. Radiology: ordered.  Risk Prescription drug  management. Decision regarding hospitalization.   Amber Franco 53 y.o. presented today for shortness of breath.  Working DDx that I considered at this time includes, but not limited to, asthma/COPD exacerbation, URI, viral illness, anemia, ACS, PE, pneumonia, pleural effusion, lung mass.  R/o DDx: These are considered less likely due to history of present illness, physical exam, labs/imaging findings  Pmhx: asthma   Review of prior external notes: None  Unique Tests and My Interpretation:  CBC: No leukocytosis, no anemia BMP: Potassium 3.1 will supplement with p.o. potassium EKG: sinus tachycardia  Troponin: 111, repeat wnl CXR: vascular congestion versus atypical viral infection   BNP: 1800 Lipase:  43    Problem List / ED Course / Critical interventions / Medication management  Reporting to emergency room with orthopnea, exertional shortness of breath.  Patient has no history of congestive heart failure however BNP is elevated.  Will give Lasix .  Chest x-ray shows pulmonary congestion consistent with patient's symptoms of orthopnea and dyspnea on exertion.  Patient would benefit from admission, echo and continuing to monitor symptoms.  Patient is not established with primary care or cardiology.  Recently moved to this area.  No known cardiac history.  Stable at this time, not requiring oxygen.  I ordered medication including Duoneb for SOB Reevaluation of the patient after these medicines showed that the patient stayed the same.  Patients vitals assessed. Upon arrival patient is hemodynamically stable.  I have reviewed the patients home medicines and have made adjustments as needed    Plan:  Admit for new onset heart failure          Final Clinical Impression(s) / ED Diagnoses Final diagnoses:  Congestive heart failure, unspecified HF chronicity, unspecified heart failure type [facility])    Rx / DC Orders ED Discharge Orders     None         Amber Shutes N,  PA-C 07/06/23 1216    Randol Simmonds, MD 07/06/23 1925

## 2023-07-07 DIAGNOSIS — I110 Hypertensive heart disease with heart failure: Secondary | ICD-10-CM | POA: Diagnosis not present

## 2023-07-07 DIAGNOSIS — I509 Heart failure, unspecified: Secondary | ICD-10-CM | POA: Diagnosis not present

## 2023-07-07 DIAGNOSIS — E876 Hypokalemia: Secondary | ICD-10-CM | POA: Diagnosis not present

## 2023-07-07 DIAGNOSIS — I5021 Acute systolic (congestive) heart failure: Secondary | ICD-10-CM | POA: Diagnosis not present

## 2023-07-07 DIAGNOSIS — Z91018 Allergy to other foods: Secondary | ICD-10-CM | POA: Diagnosis not present

## 2023-07-07 DIAGNOSIS — Z79899 Other long term (current) drug therapy: Secondary | ICD-10-CM | POA: Diagnosis not present

## 2023-07-07 DIAGNOSIS — F17210 Nicotine dependence, cigarettes, uncomplicated: Secondary | ICD-10-CM | POA: Diagnosis not present

## 2023-07-07 DIAGNOSIS — I5082 Biventricular heart failure: Secondary | ICD-10-CM | POA: Diagnosis not present

## 2023-07-07 DIAGNOSIS — J45909 Unspecified asthma, uncomplicated: Secondary | ICD-10-CM | POA: Diagnosis not present

## 2023-07-07 DIAGNOSIS — Z885 Allergy status to narcotic agent status: Secondary | ICD-10-CM | POA: Diagnosis not present

## 2023-07-07 DIAGNOSIS — K761 Chronic passive congestion of liver: Secondary | ICD-10-CM | POA: Diagnosis not present

## 2023-07-07 DIAGNOSIS — D573 Sickle-cell trait: Secondary | ICD-10-CM | POA: Diagnosis not present

## 2023-07-07 DIAGNOSIS — I428 Other cardiomyopathies: Secondary | ICD-10-CM | POA: Diagnosis not present

## 2023-07-07 LAB — BASIC METABOLIC PANEL
Anion gap: 10 (ref 5–15)
BUN: 13 mg/dL (ref 6–20)
CO2: 22 mmol/L (ref 22–32)
Calcium: 9 mg/dL (ref 8.9–10.3)
Chloride: 108 mmol/L (ref 98–111)
Creatinine, Ser: 0.96 mg/dL (ref 0.44–1.00)
GFR, Estimated: >60 mL/min (ref >60)
Glucose, Bld: 116 mg/dL — ABNORMAL HIGH (ref 70–99)
Potassium: 4.1 mmol/L (ref 3.5–5.1)
Sodium: 140 mmol/L (ref 135–145)

## 2023-07-07 LAB — HEPATITIS PANEL, ACUTE
HCV Ab: NONREACTIVE
Hep A IgM: NONREACTIVE
Hep B C IgM: NONREACTIVE
Hepatitis B Surface Ag: NONREACTIVE

## 2023-07-07 LAB — TSH: TSH: 2.745 u[IU]/mL (ref 0.350–4.500)

## 2023-07-07 LAB — COMPREHENSIVE METABOLIC PANEL
ALT: 49 U/L — ABNORMAL HIGH (ref 0–44)
AST: 28 U/L (ref 15–41)
Albumin: 3.2 g/dL — ABNORMAL LOW (ref 3.5–5.0)
Alkaline Phosphatase: 34 U/L — ABNORMAL LOW (ref 38–126)
Anion gap: 9 (ref 5–15)
BUN: 8 mg/dL (ref 6–20)
CO2: 21 mmol/L — ABNORMAL LOW (ref 22–32)
Calcium: 8.7 mg/dL — ABNORMAL LOW (ref 8.9–10.3)
Chloride: 108 mmol/L (ref 98–111)
Creatinine, Ser: 0.91 mg/dL (ref 0.44–1.00)
GFR, Estimated: >60 mL/min (ref >60)
Glucose, Bld: 112 mg/dL — ABNORMAL HIGH (ref 70–99)
Potassium: 3.6 mmol/L (ref 3.5–5.1)
Sodium: 138 mmol/L (ref 135–145)
Total Bilirubin: 1.8 mg/dL — ABNORMAL HIGH (ref 0.0–1.2)
Total Protein: 5.8 g/dL — ABNORMAL LOW (ref 6.5–8.1)

## 2023-07-07 LAB — CBC
HCT: 35 % — ABNORMAL LOW (ref 36.0–46.0)
Hemoglobin: 12 g/dL (ref 12.0–15.0)
MCH: 29.9 pg (ref 26.0–34.0)
MCHC: 34.3 g/dL (ref 30.0–36.0)
MCV: 87.1 fL (ref 80.0–100.0)
Platelets: 242 10*3/uL (ref 150–400)
RBC: 4.02 MIL/uL (ref 3.87–5.11)
RDW: 12.5 % (ref 11.5–15.5)
WBC: 7.2 10*3/uL (ref 4.0–10.5)
nRBC: 0 % (ref 0.0–0.2)

## 2023-07-07 LAB — MAGNESIUM: Magnesium: 1.6 mg/dL — ABNORMAL LOW (ref 1.7–2.4)

## 2023-07-07 LAB — TROPONIN I (HIGH SENSITIVITY)
Troponin I (High Sensitivity): 9 ng/L (ref <18)
Troponin I (High Sensitivity): 8 ng/L (ref <18)

## 2023-07-07 MED ORDER — LORAZEPAM 2 MG/ML IJ SOLN
1.0000 mg | Freq: Four times a day (QID) | INTRAMUSCULAR | Status: DC | PRN
  Administered 2023-07-07: 1 mg via INTRAVENOUS
  Filled 2023-07-07: qty 1

## 2023-07-07 MED ORDER — MAGNESIUM SULFATE 2 GM/50ML IV SOLN
2.0000 g | Freq: Once | INTRAVENOUS | Status: AC
  Administered 2023-07-07: 2 g via INTRAVENOUS
  Filled 2023-07-07: qty 50

## 2023-07-07 MED ORDER — ASPIRIN 81 MG PO CHEW
81.0000 mg | CHEWABLE_TABLET | ORAL | Status: AC
  Administered 2023-07-08: 81 mg via ORAL
  Filled 2023-07-07: qty 1

## 2023-07-07 MED ORDER — DIGOXIN 125 MCG PO TABS
0.1250 mg | ORAL_TABLET | Freq: Every day | ORAL | Status: DC
  Administered 2023-07-07 – 2023-07-13 (×7): 0.125 mg via ORAL
  Filled 2023-07-07 (×7): qty 1

## 2023-07-07 MED ORDER — PANTOPRAZOLE SODIUM 40 MG PO TBEC
40.0000 mg | DELAYED_RELEASE_TABLET | Freq: Every day | ORAL | Status: DC
  Administered 2023-07-07 – 2023-07-13 (×7): 40 mg via ORAL
  Filled 2023-07-07 (×7): qty 1

## 2023-07-07 MED ORDER — POTASSIUM CHLORIDE CRYS ER 20 MEQ PO TBCR
40.0000 meq | EXTENDED_RELEASE_TABLET | Freq: Four times a day (QID) | ORAL | Status: AC
  Administered 2023-07-07 (×2): 40 meq via ORAL
  Filled 2023-07-07 (×2): qty 2

## 2023-07-07 MED ORDER — SPIRONOLACTONE 12.5 MG HALF TABLET
12.5000 mg | ORAL_TABLET | Freq: Every day | ORAL | Status: DC
  Administered 2023-07-07 – 2023-07-13 (×7): 12.5 mg via ORAL
  Filled 2023-07-07 (×7): qty 1

## 2023-07-07 MED ORDER — FUROSEMIDE 10 MG/ML IJ SOLN
80.0000 mg | Freq: Once | INTRAMUSCULAR | Status: AC
  Administered 2023-07-07: 80 mg via INTRAVENOUS
  Filled 2023-07-07: qty 8

## 2023-07-07 MED ORDER — LIDOCAINE 5 % EX PTCH: 1.0000 | MEDICATED_PATCH | Freq: Every day | CUTANEOUS | Status: DC | PRN

## 2023-07-07 MED ORDER — ENOXAPARIN SODIUM 40 MG/0.4ML IJ SOSY
40.0000 mg | PREFILLED_SYRINGE | INTRAMUSCULAR | Status: AC
  Administered 2023-07-07: 40 mg via SUBCUTANEOUS
  Filled 2023-07-07: qty 0.4

## 2023-07-07 MED ORDER — SODIUM CHLORIDE 0.9 % IV SOLN: INTRAVENOUS | Status: DC

## 2023-07-07 NOTE — Plan of Care (Signed)
 [facility]MTS Brief Progress Note  S: Patient resting in bed. no new complaints or concerns at this time   O: BP 108/84 (BP Location: Right Arm)   Pulse 98   Temp 97.7 F (36.5 C) (Oral)   Resp 18   Ht 5' 2 (1.575 m)   Wt 80.9 kg   SpO2 100%   BMI 32.61 kg/m   Lying in bed, NAD, pleasant, breathing comfortably on room air   A/P: New onset CHF S/p IV Lasix  today and started on spironolactone  and digoxin .  Plan for [facility] tomorrow, she is NPO after MN.  Will f/u morning mag, BMP, CBC and hepatic function panel.   Joshua Domino, DO 07/07/2023, 10:32 PM PGY-3, [facility] Night Resident  Please page [phone] with questions.

## 2023-07-07 NOTE — Progress Notes (Signed)
 Heart Failure Navigator Progress Note  Assessed for Heart & Vascular TOC clinic readiness.  Patient does not meet criteria due to Advanced Heart Failure Team consulted..   Navigator will sign off at this time.    Stephane Haddock, BSN, Scientist, clinical (histocompatibility and immunogenetics) Only

## 2023-07-07 NOTE — Consult Note (Addendum)
 Advanced Heart Failure Team Consult Note   Primary Physician: Forest Coy, MD Cardiologist:    Reason for Consultation: Acute systolic CHF  HPI:    Amber Franco is seen today for evaluation of acute systolic CHF at the request of Dr. Jeanelle with FMTS. 53 y.o. female with history of HTN (no longer on medications) and sickle cell trait.    She recently moved back to the area from [place]  to take care of her ill father. Her father passed away on 2024/07/07 and husband passed away on 2024-07-18. Presented to the ED yesterday with complaints of dyspnea on exertion, orthopnea and chest pain worsening for about 4 days after she fell onto a plastic tub. Difficult to obtain full history from patient as she is understandably very upset and tearful. On chart review sounds like her symptoms may have started even prior to the fall. Labs significant for Cr 0.84, K 3.1 CO2 21, Hgb 12.4, HS troponin 11>10, Tbili 2.1, ALT 54, Alk phos 35, BNP 1808, lactic acid < 2. CXR suggestive of CHF. CT chest with bilateral pleural effusions, GGO in b/l lower lobes possible d/t edema, no fracture or chest hematoma. ECG: Sinus tachycardia 114 bpm. She was given nebs, K supplemented and 40 mg lasix  IV. Echo with EF < 20%, slow swirling flow at LV apex w/o thrombus, RV severely reduced, moderate MR. She was admitted under FMTS for acute systolic CHF.  Advanced Heart Failure asked to see to assist with management of CHF.   Home Medications Prior to Admission medications   Medication Sig Start Date End Date Taking? Authorizing Provider  baclofen  (LIORESAL ) 10 MG tablet Take 10 mg by mouth at bedtime as needed for muscle spasms.   Yes [provider]  nortriptyline (PAMELOR) 10 MG capsule Take 10 mg by mouth at bedtime.   Yes [provider]  traZODone (DESYREL) 50 MG tablet Take 50 mg by mouth at bedtime as needed for sleep. Patient not taking: Reported on 07/06/2023    [provider]    Past  Medical History: Past Medical History:  Diagnosis Date   Allergy    Hypertension    Sickle cell trait (HCC)    Stroke [facility])     Past Surgical History: Past Surgical History:  Procedure Laterality Date   TUBAL LIGATION      Family History: Family History  Problem Relation Age of Onset   Cancer Mother    Hypertension Father    Stroke Father     Social History: Social History   Socioeconomic History   Marital status: Single    Spouse name: Not on file   Number of children: Not on file   Years of education: Not on file   Highest education level: Not on file  Occupational History   Not on file  Tobacco Use   Smoking status: Every Day    Current packs/day: 0.25    Types: Cigarettes   Smokeless tobacco: Never   Tobacco comments:    2 per day . Increase x 1 month now back down  Substance and Sexual Activity   Alcohol use: No   Drug use: No   Sexual activity: Not on file  Other Topics Concern   Not on file  Social History Narrative   Not on file   Social Drivers of Health   Financial Resource Strain: Low Risk  (06/14/2019)   Received from [facility] and [employer], Lobbyist and [employer]  Practices   Overall Financial Resource Strain (CARDIA)    Difficulty of Paying Living Expenses: Not hard at all  Food Insecurity: No Food Insecurity (06/14/2019)   Received from [facility] and [employer], Lobbyist and [employer] Vital Sign    Worried About Programme Researcher, Broadcasting/film/video in the Last Year: Never true    [employer] of Food in the Last Year: Never true  Transportation Needs: No Transportation Needs (06/14/2019)   Received from [employer] and [employer], Lobbyist and [employer]   [employer] - Administrator, Civil Service (Medical): No    Lack of Transportation (Non-Medical): No   Physical Activity: Inactive (06/14/2019)   Received from [facility] and [employer], [facility] and [employer]   Exercise Vital Sign    Days of Exercise per Week: 0 days    Minutes of Exercise per Session: 0 min  Stress: No Stress Concern Present (06/14/2019)   Received from [facility] and [employer], Lobbyist and [employer] of Occupational Health - Occupational Stress Questionnaire    Feeling of Stress : Not at all  Social Connections: Unknown (06/14/2019)   Received from [employer] and [employer], Lobbyist and [employer]   Social Connection and Isolation Panel [NHANES]    Frequency of Communication with Friends and Family: Three times a week    Frequency of Social Gatherings with Friends and Family: Twice a week    Attends Religious Services: Never    Database Administrator or Organizations: No    Attends Banker Meetings: Never    Marital Status: Not on file    Allergies:  Allergies  Allergen Reactions   Strawberry (Diagnostic) Anaphylaxis   Tape Other (See Comments)    Leaves marks on skin    Objective:    Vital Signs:   Temp:  [97.3 F (36.3 C)-97.8 F (36.6 C)] 97.7 F (36.5 C) (01/06 0817) Pulse Rate:  [98-112] 112 (01/06 0817) Resp:  [15-37] 20 (01/06 0817) BP: (98-127)/(75-98) 115/95 (01/06 0817) SpO2:  [95 %-99 %] 99 % (01/06 0817) Weight:  [80.9 kg-81 kg] 80.9 kg (01/06 0553)    Weight change: Filed Weights   07/06/23 2216 07/07/23 0553  Weight: 81 kg 80.9 kg    Intake/Output:   Intake/Output Summary (Last 24 hours) at 07/07/2023 1058 Last data filed at 07/07/2023 0900 Gross per 24 hour  Intake 460 ml  Output 500 ml  Net -40 ml      Physical Exam    General: Tearful. HEENT: normal Neck: supple. JVP elevated.  Cor: PMI  nondisplaced. Regular rate & rhythm, tachy. No rubs, gallops or murmurs. Lungs: clear Abdomen: soft, nontender, nondistended. No hepatosplenomegaly. No bruits or masses. Good bowel sounds. Extremities: no cyanosis, clubbing, rash, edema Neuro: alert & orientedx3. Affect anxious.   Telemetry   Sinus tach 100s-120s  EKG    Sinus tach 114 bpm  Labs   Basic Metabolic Panel: Recent Labs  Lab 07/06/23 0823 07/07/23 0250  NA 138 138  K 3.1* 3.6  CL 107 108  CO2 21* 21*  GLUCOSE 94 112*  BUN 6 8  CREATININE 0.84 0.91  CALCIUM  9.0 8.7*  MG  --  1.6*    Liver Function Tests: Recent Labs  Lab 07/06/23 1050 07/07/23 0250  AST 30 28  ALT 54* 49*  ALKPHOS 35* 34*  BILITOT 2.1* 1.8*  PROT 6.4* 5.8*  ALBUMIN 3.4* 3.2*   Recent Labs  Lab 07/06/23 1050  LIPASE 43   No results for input(s): AMMONIA in the last 168 hours.  CBC: Recent Labs  Lab 07/06/23 0823 07/07/23 0250  WBC 9.8 7.2  HGB 12.4 12.0  HCT 36.6 35.0*  MCV 87.8 87.1  PLT 233 242    Cardiac Enzymes: No results for input(s): CKTOTAL, CKMB, CKMBINDEX, TROPONINI in the last 168 hours.  BNP: BNP (last 3 results) Recent Labs    07/06/23 1050  BNP 1,808.6*    ProBNP (last 3 results) No results for input(s): PROBNP in the last 8760 hours.   CBG: No results for input(s): GLUCAP in the last 168 hours.  Coagulation Studies: No results for input(s): LABPROT, INR in the last 72 hours.   Imaging   CT CHEST W CONTRAST Result Date: 07/06/2023 CLINICAL DATA:  Clemens down stairs. EXAM: CT CHEST WITH CONTRAST TECHNIQUE: Multidetector CT imaging of the chest was performed during intravenous contrast administration. RADIATION DOSE REDUCTION: This exam was performed according to the departmental dose-optimization program which includes automated exposure control, adjustment of the mA and/or kV according to patient size and/or use of iterative reconstruction technique. CONTRAST:  50mL  OMNIPAQUE  IOHEXOL  350 MG/ML SOLN COMPARISON:  None Available. FINDINGS: Cardiovascular: Heart is mildly enlarged. Aorta is normal in size. There is no pericardial effusion. Mediastinum/Nodes: Twos 1 no evidence for pneumomediastinum or mediastinal hematoma. Lungs/Pleura: There are small bilateral pleural effusions. There are atelectatic changes in the bilateral lower lobes. There are minimal ground-glass opacities in the bilateral lower lobes. There is no pneumothorax. Trachea and central airways are patent. Upper Abdomen: No acute abnormality. Musculoskeletal: No fracture is seen. No focal hematoma. There is an intramuscular lipoma along the posterior left chest wall. IMPRESSION: 1. Small bilateral pleural effusions with atelectatic changes in the bilateral lower lobes. 2. Minimal ground-glass opacities in the bilateral lower lobes favored as edema. Infection not excluded. 3. Mild cardiomegaly. 4. No evidence for fracture or focal hematoma. Electronically Signed   By: Greig Pique M.D.   On: 07/06/2023 17:40   ECHOCARDIOGRAM COMPLETE Result Date: 07/06/2023    ECHOCARDIOGRAM REPORT   Patient Name:   Amber Franco Date of Exam: 07/06/2023 Medical Rec #:  [id number]   Height:       65.0 in Accession #:    [id number]  Weight:       197.1 lb Date of Birth:  04-15-1971   BSA:          1.966 m Patient Age:    52 years    BP:           127/98 mmHg Patient Gender: F           HR:           104 bpm. Exam Location:  Inpatient Procedure: 2D Echo, Cardiac Doppler, Color Doppler and Intracardiac            Opacification Agent Indications:    Blunt Chest Trauma  History:        Patient has prior history of Echocardiogram examinations and                 Patient has no prior history of Echocardiogram examinations.  Sonographer:    Jayson Gaskins Referring Phys: [id number] MARSHALL L CHAMBLISS IMPRESSIONS  1. Left ventricular ejection fraction, by estimation, is <20%. The  left ventricle has severely decreased function. The left ventricle  demonstrates global hypokinesis. The left ventricular internal cavity size was mildly to moderately dilated. Left ventricular diastolic parameters are indeterminate.  2. Definity  contrast demonstrates slow, swirling flow at the LV apex without formed thrombus.  3. RV-RA gradient 31 mmHg suggesting at least mildly elevated estimated RVSP depending on CVP. Right ventricular systolic function is severely reduced. The right ventricular size is mildly enlarged.  4. The mitral valve is grossly normal. Moderate mitral valve regurgitation, possibly related to annular dilatation.  5. The aortic valve is tricuspid. Aortic valve regurgitation is trivial.  6. Unable to estimate CVP. Comparison(s): No prior Echocardiogram. FINDINGS  Left Ventricle: Left ventricular ejection fraction, by estimation, is <20%. The left ventricle has severely decreased function. The left ventricle demonstrates global hypokinesis. The left ventricular internal cavity size was mildly to moderately dilated. There is no left ventricular hypertrophy. Left ventricular diastolic parameters are indeterminate. Right Ventricle: RV-RA gradient 31 mmHg suggesting at least mildly elevated estimated RVSP depending on CVP. The right ventricular size is mildly enlarged. No increase in right ventricular wall thickness. Right ventricular systolic function is severely reduced. Left Atrium: Left atrial size was normal in size. Right Atrium: Right atrial size was normal in size. Pericardium: There is no evidence of pericardial effusion. Mitral Valve: The mitral valve is grossly normal. Moderate mitral valve regurgitation. Tricuspid Valve: The tricuspid valve is grossly normal. Tricuspid valve regurgitation is mild. Aortic Valve: The aortic valve is tricuspid. Aortic valve regurgitation is trivial. Aortic valve peak gradient measures 1.6 mmHg. Pulmonic Valve: The pulmonic valve was not well visualized. Pulmonic valve regurgitation is trivial. Aorta: The aortic root is  normal in size and structure. Venous: Unable to estimate CVP. The inferior vena cava was not well visualized. IAS/Shunts: No atrial level shunt detected by color flow Doppler.  LEFT VENTRICLE PLAX 2D LVIDd:         5.90 cm LVIDs:         5.60 cm LV PW:         0.80 cm LV IVS:        0.80 cm  RIGHT VENTRICLE RV S prime:     8.05 cm/s TAPSE (M-mode): 1.0 cm LEFT ATRIUM           Index        RIGHT ATRIUM           Index LA Vol (A2C): 65.6 ml 33.37 ml/m  RA Area:     17.10 cm LA Vol (A4C): 27.6 ml 14.04 ml/m  RA Volume:   48.80 ml  24.82 ml/m  AORTIC VALVE AV Vmax:      63.00 cm/s AV Peak Grad: 1.6 mmHg LVOT Vmax:    50.30 cm/s LVOT Vmean:   39.100 cm/s LVOT VTI:     0.071 m MITRAL VALVE                TRICUSPID VALVE MV Area (PHT): 5.88 cm     TR Peak grad:   31.4 mmHg MV Decel Time: 129 msec     TR Vmax:        280.00 cm/s MV E velocity: 116.00 cm/s                             SHUNTS  Systemic VTI: 0.07 m Jayson Sierras MD Electronically signed by Jayson Sierras MD Signature Date/Time: 07/06/2023/2:22:09 PM    Final      Medications:     Current Medications:  melatonin  5 mg Oral QHS   spironolactone   12.5 mg Oral Daily    Infusions:     Patient Profile  53 y.o. female with history of HTN (off meds with lifestyle changes), sickle cell trait. Admitted with acute systolic CHF.  Assessment/Plan  Acute systolic CHF -New diagnosis -Echo with EF < 20%, slow swirling flow at LV apex w/o thrombus, RV severely reduced, moderate MR -Etiology not certain. Has history of HTN but does not seem this has been uncontrolled.  Takotsubo stress cardiomyopathy is a consideration given recent stressors but echo w/o typical findings.  -Plan for [facility] tomorrow to assess coronaries and hemodynamics. -If no significant CAD on cath, may need cMRI. -Volume overloaded. Give 80 mg lasix  IV now. May need another dose this afternoon. Supp K and Mag. -Start digoxin  0.125 mg daily -Start  spiro 12.5 mg daily -Doesn't have much BP room to titrate medications further.  2. HTN -BP not currently elevated, actually soft at times  3. Elevated LFTs Elevated Tbili -Suspect may be d/t hepatic congestion -Trend  Length of Stay: 1  Amber Franco N, PA-C  07/07/2023, 10:58 AM  Advanced Heart Failure Team Pager [phone] (M-F; 7a - 5p)  Please contact [facility] for night-coverage after hours (4p -7a ) and weekends on [email or web address]

## 2023-07-07 NOTE — TOC Initial Note (Signed)
 Transition of Care [facility]) - Initial/Assessment Note    Patient Details  Name: Amber Franco MRN: [id number] Date of Birth: 03-06-1971  Transition of Care [facility]) CM/SW Contact:    Arlana JINNY Nicholaus ISRAEL Phone Number: [phone] 07/07/2023, 11:20 AM  Clinical Narrative:    HF CSW met with pt at bedside. Pt stated that she lives alone. Pt appeared to be tearful and sad. Pt stated that her husband is being buried today and she is in the hospital. Pt stated that she recently moved from [place] to take care of her father who also recently passed away. Pt inquired about disability. CSW stated that she will give her information to the financial counselors.   Pt stated that she has no history of HH services. Pt stated that she does not use any equipment. Pt stated that she was unsure if there was a scale in the home. Pt stated that she does not have a PCP. CSW stated that a hospital follow up appointment will be scheduled closer to dc. Pt agrees.   TOC will continue following.                Expected Discharge Plan: Home/Self Care Barriers to Discharge: Continued Medical Work up   Patient Goals and CMS Choice            Expected Discharge Plan and Services       Living arrangements for the past 2 months: Single Family Home                                      Prior Living Arrangements/Services Living arrangements for the past 2 months: Single Family Home Lives with:: Self Patient language and need for interpreter reviewed:: Yes Do you feel safe going back to the place where you live?: Yes      Need for Family Participation in Patient Care: No (Comment) Care giver support system in place?: No (comment)   Criminal Activity/Legal Involvement Pertinent to Current Situation/Hospitalization: No - Comment as needed  Activities of Daily Living      Permission Sought/Granted                  Emotional Assessment Appearance:: Appears stated age Attitude/Demeanor/Rapport:  Engaged Affect (typically observed): Appropriate Orientation: : Oriented to Self, Oriented to Place, Oriented to  Time, Oriented to Situation Alcohol / Substance Use: Not Applicable Psych Involvement: No (comment)  Admission diagnosis:  Dyspnea [R06.00] Congestive heart failure, unspecified HF chronicity, unspecified heart failure type [facility]) [I50.9] Patient Active Problem List   Diagnosis Date Noted   New onset of congestive heart failure (HCC) 07/06/2023   LFT elevation 07/06/2023   Depression 10/30/2016   Fibromyalgia 10/30/2016   Neuropathy 08/05/2016   Generalized anxiety disorder 01/22/2016   Hypertension 10/23/2015   Allergic rhinitis 10/23/2015   Tobacco abuse 10/23/2015   Spasm of muscle, back 10/23/2015   Health care maintenance 10/23/2015   PCP:  Forest Coy, MD Pharmacy:   [facility] [address] [place] [address] Phone: [phone] Fax: [phone]     Social Drivers of Health (SDOH) Social History: SDOH Screenings   Food Insecurity: No Food Insecurity (06/14/2019)   Received from [employer] and [employer], Lobbyist and Firefighter Needs: No Transportation Needs (06/14/2019)   Received from [employer] and [employer], Optometrist  Health Affiliates and Geneticist, Molecular Strain: Low Risk  (06/14/2019)   Received from [facility] and [employer], [employer] Affiliates and [employer]  Physical Activity: Inactive (06/14/2019)   Received from [employer] and [employer], Lobbyist and [employer]  Social Connections: Unknown (06/14/2019)   Received from [employer] and [employer], [employer] Affiliates and [employer]   Stress: No Stress Concern Present (06/14/2019)   Received from [employer] and [employer], [employer] Affiliates and [employer]  Tobacco Use: Medium Risk (01/06/2022)   Received from [employer] and [employer], Engineer, Civil (consulting) Affiliates and [employer]   SDOH Interventions:     Readmission Risk Interventions     No data to display

## 2023-07-07 NOTE — H&P (View-Only) (Signed)
 Advanced Heart Failure Team Consult Note   Primary Physician: Forest Coy, MD Cardiologist:    Reason for Consultation: Acute systolic CHF  HPI:    Amber Franco is seen today for evaluation of acute systolic CHF at the request of Dr. Jeanelle with FMTS. 53 y.o. female with history of HTN (no longer on medications) and sickle cell trait.    She recently moved back to the area from [place]  to take care of her ill father. Her father passed away on 2024/07/07 and husband passed away on 2024-07-18. Presented to the ED yesterday with complaints of dyspnea on exertion, orthopnea and chest pain worsening for about 4 days after she fell onto a plastic tub. Difficult to obtain full history from patient as she is understandably very upset and tearful. On chart review sounds like her symptoms may have started even prior to the fall. Labs significant for Cr 0.84, K 3.1 CO2 21, Hgb 12.4, HS troponin 11>10, Tbili 2.1, ALT 54, Alk phos 35, BNP 1808, lactic acid < 2. CXR suggestive of CHF. CT chest with bilateral pleural effusions, GGO in b/l lower lobes possible d/t edema, no fracture or chest hematoma. ECG: Sinus tachycardia 114 bpm. She was given nebs, K supplemented and 40 mg lasix  IV. Echo with EF < 20%, slow swirling flow at LV apex w/o thrombus, RV severely reduced, moderate MR. She was admitted under FMTS for acute systolic CHF.  Advanced Heart Failure asked to see to assist with management of CHF.   Home Medications Prior to Admission medications   Medication Sig Start Date End Date Taking? Authorizing Provider  baclofen  (LIORESAL ) 10 MG tablet Take 10 mg by mouth at bedtime as needed for muscle spasms.   Yes [provider]  nortriptyline (PAMELOR) 10 MG capsule Take 10 mg by mouth at bedtime.   Yes [provider]  traZODone (DESYREL) 50 MG tablet Take 50 mg by mouth at bedtime as needed for sleep. Patient not taking: Reported on 07/06/2023    [provider]    Past  Medical History: Past Medical History:  Diagnosis Date   Allergy    Hypertension    Sickle cell trait (HCC)    Stroke [facility])     Past Surgical History: Past Surgical History:  Procedure Laterality Date   TUBAL LIGATION      Family History: Family History  Problem Relation Age of Onset   Cancer Mother    Hypertension Father    Stroke Father     Social History: Social History   Socioeconomic History   Marital status: Single    Spouse name: Not on file   Number of children: Not on file   Years of education: Not on file   Highest education level: Not on file  Occupational History   Not on file  Tobacco Use   Smoking status: Every Day    Current packs/day: 0.25    Types: Cigarettes   Smokeless tobacco: Never   Tobacco comments:    2 per day . Increase x 1 month now back down  Substance and Sexual Activity   Alcohol use: No   Drug use: No   Sexual activity: Not on file  Other Topics Concern   Not on file  Social History Narrative   Not on file   Social Drivers of Health   Financial Resource Strain: Low Risk  (06/14/2019)   Received from [facility] and [employer], Lobbyist and [employer]  Practices   Overall Financial Resource Strain (CARDIA)    Difficulty of Paying Living Expenses: Not hard at all  Food Insecurity: No Food Insecurity (06/14/2019)   Received from [facility] and [employer], Lobbyist and [employer] Vital Sign    Worried About Programme Researcher, Broadcasting/film/video in the Last Year: Never true    [employer] of Food in the Last Year: Never true  Transportation Needs: No Transportation Needs (06/14/2019)   Received from [employer] and [employer], Lobbyist and Chief Technology Officer   [employer] - Administrator, Civil Service (Medical): No    Lack of Transportation (Non-Medical): No   Physical Activity: Inactive (06/14/2019)   Received from [facility] and [employer], Optometrist Health Affiliates and [employer]   Exercise Vital Sign    Days of Exercise per Week: 0 days    Minutes of Exercise per Session: 0 min  Stress: No Stress Concern Present (06/14/2019)   Received from [facility] and [employer], Lobbyist and [employer] of Occupational Health - Occupational Stress Questionnaire    Feeling of Stress : Not at all  Social Connections: Unknown (06/14/2019)   Received from [employer] and [employer], Lobbyist and [employer]   Social Connection and Isolation Panel [NHANES]    Frequency of Communication with Friends and Family: Three times a week    Frequency of Social Gatherings with Friends and Family: Twice a week    Attends Religious Services: Never    Database Administrator or Organizations: No    Attends Banker Meetings: Never    Marital Status: Not on file    Allergies:  Allergies  Allergen Reactions   Strawberry (Diagnostic) Anaphylaxis   Tape Other (See Comments)    Leaves marks on skin    Objective:    Vital Signs:   Temp:  [97.3 F (36.3 C)-97.8 F (36.6 C)] 97.7 F (36.5 C) (01/06 0817) Pulse Rate:  [98-112] 112 (01/06 0817) Resp:  [15-37] 20 (01/06 0817) BP: (98-127)/(75-98) 115/95 (01/06 0817) SpO2:  [95 %-99 %] 99 % (01/06 0817) Weight:  [80.9 kg-81 kg] 80.9 kg (01/06 0553)    Weight change: Filed Weights   07/06/23 2216 07/07/23 0553  Weight: 81 kg 80.9 kg    Intake/Output:   Intake/Output Summary (Last 24 hours) at 07/07/2023 1058 Last data filed at 07/07/2023 0900 Gross per 24 hour  Intake 460 ml  Output 500 ml  Net -40 ml      Physical Exam    General: Tearful. HEENT: normal Neck: supple. JVP elevated.  Cor: PMI  nondisplaced. Regular rate & rhythm, tachy. No rubs, gallops or murmurs. Lungs: clear Abdomen: soft, nontender, nondistended. No hepatosplenomegaly. No bruits or masses. Good bowel sounds. Extremities: no cyanosis, clubbing, rash, edema Neuro: alert & orientedx3. Affect anxious.   Telemetry   Sinus tach 100s-120s  EKG    Sinus tach 114 bpm  Labs   Basic Metabolic Panel: Recent Labs  Lab 07/06/23 0823 07/07/23 0250  NA 138 138  K 3.1* 3.6  CL 107 108  CO2 21* 21*  GLUCOSE 94 112*  BUN 6 8  CREATININE 0.84 0.91  CALCIUM  9.0 8.7*  MG  --  1.6*    Liver Function Tests: Recent Labs  Lab 07/06/23 1050 07/07/23 0250  AST 30 28  ALT 54* 49*  ALKPHOS 35* 34*  BILITOT 2.1* 1.8*  PROT 6.4* 5.8*  ALBUMIN 3.4* 3.2*   Recent Labs  Lab 07/06/23 1050  LIPASE 43   No results for input(s): AMMONIA in the last 168 hours.  CBC: Recent Labs  Lab 07/06/23 0823 07/07/23 0250  WBC 9.8 7.2  HGB 12.4 12.0  HCT 36.6 35.0*  MCV 87.8 87.1  PLT 233 242    Cardiac Enzymes: No results for input(s): CKTOTAL, CKMB, CKMBINDEX, TROPONINI in the last 168 hours.  BNP: BNP (last 3 results) Recent Labs    07/06/23 1050  BNP 1,808.6*    ProBNP (last 3 results) No results for input(s): PROBNP in the last 8760 hours.   CBG: No results for input(s): GLUCAP in the last 168 hours.  Coagulation Studies: No results for input(s): LABPROT, INR in the last 72 hours.   Imaging   CT CHEST W CONTRAST Result Date: 07/06/2023 CLINICAL DATA:  Clemens down stairs. EXAM: CT CHEST WITH CONTRAST TECHNIQUE: Multidetector CT imaging of the chest was performed during intravenous contrast administration. RADIATION DOSE REDUCTION: This exam was performed according to the departmental dose-optimization program which includes automated exposure control, adjustment of the mA and/or kV according to patient size and/or use of iterative reconstruction technique. CONTRAST:  50mL  OMNIPAQUE  IOHEXOL  350 MG/ML SOLN COMPARISON:  None Available. FINDINGS: Cardiovascular: Heart is mildly enlarged. Aorta is normal in size. There is no pericardial effusion. Mediastinum/Nodes: Twos 1 no evidence for pneumomediastinum or mediastinal hematoma. Lungs/Pleura: There are small bilateral pleural effusions. There are atelectatic changes in the bilateral lower lobes. There are minimal ground-glass opacities in the bilateral lower lobes. There is no pneumothorax. Trachea and central airways are patent. Upper Abdomen: No acute abnormality. Musculoskeletal: No fracture is seen. No focal hematoma. There is an intramuscular lipoma along the posterior left chest wall. IMPRESSION: 1. Small bilateral pleural effusions with atelectatic changes in the bilateral lower lobes. 2. Minimal ground-glass opacities in the bilateral lower lobes favored as edema. Infection not excluded. 3. Mild cardiomegaly. 4. No evidence for fracture or focal hematoma. Electronically Signed   By: Greig Pique M.D.   On: 07/06/2023 17:40   ECHOCARDIOGRAM COMPLETE Result Date: 07/06/2023    ECHOCARDIOGRAM REPORT   Patient Name:   Amber Franco Date of Exam: 07/06/2023 Medical Rec #:  [id number]   Height:       65.0 in Accession #:    [id number]  Weight:       197.1 lb Date of Birth:  04-15-1971   BSA:          1.966 m Patient Age:    52 years    BP:           127/98 mmHg Patient Gender: F           HR:           104 bpm. Exam Location:  Inpatient Procedure: 2D Echo, Cardiac Doppler, Color Doppler and Intracardiac            Opacification Agent Indications:    Blunt Chest Trauma  History:        Patient has prior history of Echocardiogram examinations and                 Patient has no prior history of Echocardiogram examinations.  Sonographer:    Jayson Gaskins Referring Phys: [id number] MARSHALL L CHAMBLISS IMPRESSIONS  1. Left ventricular ejection fraction, by estimation, is <20%. The  left ventricle has severely decreased function. The left ventricle  demonstrates global hypokinesis. The left ventricular internal cavity size was mildly to moderately dilated. Left ventricular diastolic parameters are indeterminate.  2. Definity  contrast demonstrates slow, swirling flow at the LV apex without formed thrombus.  3. RV-RA gradient 31 mmHg suggesting at least mildly elevated estimated RVSP depending on CVP. Right ventricular systolic function is severely reduced. The right ventricular size is mildly enlarged.  4. The mitral valve is grossly normal. Moderate mitral valve regurgitation, possibly related to annular dilatation.  5. The aortic valve is tricuspid. Aortic valve regurgitation is trivial.  6. Unable to estimate CVP. Comparison(s): No prior Echocardiogram. FINDINGS  Left Ventricle: Left ventricular ejection fraction, by estimation, is <20%. The left ventricle has severely decreased function. The left ventricle demonstrates global hypokinesis. The left ventricular internal cavity size was mildly to moderately dilated. There is no left ventricular hypertrophy. Left ventricular diastolic parameters are indeterminate. Right Ventricle: RV-RA gradient 31 mmHg suggesting at least mildly elevated estimated RVSP depending on CVP. The right ventricular size is mildly enlarged. No increase in right ventricular wall thickness. Right ventricular systolic function is severely reduced. Left Atrium: Left atrial size was normal in size. Right Atrium: Right atrial size was normal in size. Pericardium: There is no evidence of pericardial effusion. Mitral Valve: The mitral valve is grossly normal. Moderate mitral valve regurgitation. Tricuspid Valve: The tricuspid valve is grossly normal. Tricuspid valve regurgitation is mild. Aortic Valve: The aortic valve is tricuspid. Aortic valve regurgitation is trivial. Aortic valve peak gradient measures 1.6 mmHg. Pulmonic Valve: The pulmonic valve was not well visualized. Pulmonic valve regurgitation is trivial. Aorta: The aortic root is  normal in size and structure. Venous: Unable to estimate CVP. The inferior vena cava was not well visualized. IAS/Shunts: No atrial level shunt detected by color flow Doppler.  LEFT VENTRICLE PLAX 2D LVIDd:         5.90 cm LVIDs:         5.60 cm LV PW:         0.80 cm LV IVS:        0.80 cm  RIGHT VENTRICLE RV S prime:     8.05 cm/s TAPSE (M-mode): 1.0 cm LEFT ATRIUM           Index        RIGHT ATRIUM           Index LA Vol (A2C): 65.6 ml 33.37 ml/m  RA Area:     17.10 cm LA Vol (A4C): 27.6 ml 14.04 ml/m  RA Volume:   48.80 ml  24.82 ml/m  AORTIC VALVE AV Vmax:      63.00 cm/s AV Peak Grad: 1.6 mmHg LVOT Vmax:    50.30 cm/s LVOT Vmean:   39.100 cm/s LVOT VTI:     0.071 m MITRAL VALVE                TRICUSPID VALVE MV Area (PHT): 5.88 cm     TR Peak grad:   31.4 mmHg MV Decel Time: 129 msec     TR Vmax:        280.00 cm/s MV E velocity: 116.00 cm/s                             SHUNTS  Systemic VTI: 0.07 m Jayson Sierras MD Electronically signed by Jayson Sierras MD Signature Date/Time: 07/06/2023/2:22:09 PM    Final      Medications:     Current Medications:  melatonin  5 mg Oral QHS   spironolactone   12.5 mg Oral Daily    Infusions:     Patient Profile  53 y.o. female with history of HTN (off meds with lifestyle changes), sickle cell trait. Admitted with acute systolic CHF.  Assessment/Plan  Acute systolic CHF -New diagnosis -Echo with EF < 20%, slow swirling flow at LV apex w/o thrombus, RV severely reduced, moderate MR -Etiology not certain. Has history of HTN but does not seem this has been uncontrolled.  Takotsubo stress cardiomyopathy is a consideration given recent stressors but echo w/o typical findings.  -Plan for [facility] tomorrow to assess coronaries and hemodynamics. -If no significant CAD on cath, may need cMRI. -Volume overloaded. Give 80 mg lasix  IV now. May need another dose this afternoon. Supp K and Mag. -Start digoxin  0.125 mg daily -Start  spiro 12.5 mg daily -Doesn't have much BP room to titrate medications further.  2. HTN -BP not currently elevated, actually soft at times  3. Elevated LFTs Elevated Tbili -Suspect may be d/t hepatic congestion -Trend  Length of Stay: 1  Didi Ganaway N, PA-C  07/07/2023, 10:58 AM  Advanced Heart Failure Team Pager [phone] (M-F; 7a - 5p)  Please contact [facility] for night-coverage after hours (4p -7a ) and weekends on [email or web address]

## 2023-07-07 NOTE — Progress Notes (Addendum)
 Daily Progress Note Intern Pager: [phone]  Patient name: Amber Franco Medical record number: [id number] Date of birth: 1971-04-27 Age: 53 y.o. Gender: female  Primary Care Provider: Forest Coy, MD Consultants: Cardiology Code Status: FULL   Pt Overview and Major Events to Date:  15/25: Admitted to FMTS  Assessment and Plan:  Amber Franco is a 53 y.o. female PMH remote hx of HTN (not on meds), chronic back pain presenting with chest pain, orthopnea and dyspnea on exertion after fall onto plastic tab 4 days ago and recent family members passing.  Found to have new acute heart failure with EF <20%. Continues to have chest pain and dyspnea, satting well on room air. Cardiology following, with possible catheterization to follow.  Assessment & Plan New onset of congestive heart failure (HCC) BNP 1808 on arrival, and echocardiogram with EF of less than 20% with global LV hypokinesis, LV apex swirling and severely reduced RV function as well as moderate MVR.  Concern for Takotsubo cardiomyopathy versus myocardial contusion. CT Chest unremarkable. Lactic acid wnl. Patient with continued chest pain and dyspnea, satting well on room air. Describes postprandial onset. Telemetry unremarkable overnight. BP stable overnight. -Ordered EKG and Troponin this AM -Added protonix  40 daily for possible reflux symptoms given postprandial nature -Cardiology following, appreciate recommendations  -GDMT per cardiology  -S/p 40 IV lasix  in ED. Continue to diurese per cardiology  -Lidocaine  patch prn for chest region -Continuous cardiac monitoring  -Strict I's/O's -Daily weights -Keep K>4, Mag >2 -Monitor blood pressure closely and for signs of cardiogenic shock -Keep O2 saturations greater than 92% -AM CMP, Mag, CBC LFT elevation AST/ALT improving  30/54 > 28/49 this morning. Tbili 2.1 > 1.8. No alcohol use. Most likely related to hepatic congestion. - Fu hepatitis panel  - Continue to trend with  AM CMP   Chronic and Stable Issues: Sleep disturbances-she is unsure what medication she takes at home, continue melatonin nightly for now History of hypertension-states that she is now off of all medications due to improved lifestyle Back pain-add home flexeril  PRN if needing ?CVA hx-states that she has previously had a stroke in 2018 however based on documentation from neurology 10/15/2016 there was no objective evidence of acute neurologic deficits or previous strokes and MRI brain negative. She states this event of aphasia happened after her daughter died from an overdose of Molly  FEN/GI: Heart diet  VTE Prophylaxis: Lovenox   Dispo: Home pending possible catheterization and clinical improvement  Subjective:  Patient describes chest pain beneath her L breast and in her sternal region that feels like electricity. She notes this pain started after eating some breakfast this morning. No history of GERD. No vomiting overnight. Has been breathing shallow due to pain. Did not sleep much overnight. Had some R sided neck pain overnight that has improved this morning.   Objective: Temp:  [97.3 F (36.3 C)-97.8 F (36.6 C)] 97.8 F (36.6 C) (01/06 0213) Pulse Rate:  [98-107] 99 (01/06 0213) Resp:  [15-37] 20 (01/06 0600) BP: (98-127)/(75-98) 98/75 (01/06 0213) SpO2:  [95 %-98 %] 98 % (01/06 0213) Weight:  [80.9 kg-81 kg] 80.9 kg (01/06 0553) Physical Exam: General: Shallow breathing, tired-appearing.  CV: Soft S1/S2. No extra heart sounds. JVD. Reproducible chest pain beneath L breast and in sternal region. Warm and well-perfused. No carotid bruit appreciated bilaterally.  Pulm: Breathing comfortably on room air. CTAB. No increased WOB. Abd: Tender in LUQ beneath L breast. Mild distention. MSK: Some tenderness to palpation of  Skin:  Warm, dry. No bruising noted on abdomen or chest.  Psych: Pleasant and appropriate.   Laboratory: Most recent CBC Lab Results  Component Value Date    WBC 7.2 07/07/2023   HGB 12.0 07/07/2023   HCT 35.0 (L) 07/07/2023   MCV 87.1 07/07/2023   PLT 242 07/07/2023   Most recent BMP    Latest Ref Rng & Units 07/07/2023    2:50 AM  BMP  Glucose 70 - 99 mg/dL 887   BUN 6 - 20 mg/dL 8   Creatinine 9.55 - 8.99 mg/dL 9.08   Sodium 864 - 854 mmol/L 138   Potassium 3.5 - 5.1 mmol/L 3.6   Chloride 98 - 111 mmol/L 108   CO2 22 - 32 mmol/L 21   Calcium  8.9 - 10.3 mg/dL 8.7    TSH: 7.254 Hepatitis panel: pending HIV: negative   Amber Perkins, MD 07/07/2023, 8:02 AM  PGY-1, [facility] FPTS Intern pager: [phone], text pages welcome Secure chat group [facility] Teaching Service

## 2023-07-07 NOTE — Progress Notes (Signed)
   07/07/23 1400  Spiritual Encounters  Type of Visit Initial  Care provided to: Patient  Conversation partners present during encounter Social worker/Care management/TOC  Referral source Patient request  Reason for visit Routine spiritual support  OnCall Visit No  Spiritual Framework  Presenting Themes Impactful experiences and emotions  Values/beliefs family  Community/Connection Family  Patient Stress Factors Loss  Interventions  Spiritual Care Interventions Made Established relationship of care and support;Compassionate presence;Reflective listening;Normalization of emotions;Meaning making  Intervention Outcomes  Outcomes Connection to spiritual care;Awareness of support   Chaplain responded to request for emotional and spiritual support. There was no family present at bedside. Patient is going through spiritual distress. She has lost her husband and father October last year and she is grieving the loss. Chaplain provided compassionate presence and asked guided questions to bring forth feelings. Chaplain remains available when needed.

## 2023-07-07 NOTE — Assessment & Plan Note (Addendum)
 AST/ALT improving  30/54 > 28/49 this morning. Tbili 2.1 > 1.8. No alcohol use. Most likely related to hepatic congestion. - Fu hepatitis panel  - Continue to trend with AM CMP

## 2023-07-07 NOTE — Hospital Course (Addendum)
 Amber Franco is a 53 y.o.female with a history of HTN (not on meds), chronic back pain who was admitted to the [facility] Teaching Service at [facility] for chest pain, orthopnea and dyspnea on exertion after fall. Her hospital course is detailed below:  New onset systolic heart failure  Symptomatic with echocardiogram with EF of less than 20% with global LV hypokinesis, LV apex swirling and severely reduced RV function as well as moderate MVR. Likely takotsubo in setting of death of husband and father. Cardiology consulted and recommended IV diuresis, GDMT, L/RHC (no ischemic disease), cardiac MR (Mild LV dilatation w/ severe systolic dysfunction (EF 25%), Normal RV w/ moderate systolic dysfunction (EF 37%), multifocal LGE possible sarcoidosis vs DCM vs prior myocarditis (recommended FDG-PET), moderate MR (22%), small pericardial effusion ). She was stable and started on spironolactone , farxiga , losartan , digoxin  and transitioned to oral lasix  with plans to hold bblocker until better compensated. Patient discharged with lasix  40 PO daily as needed for >3 lb weight gain.   LFT elevation ALT and Tbili only mildly elevated with negative hepatitis panel. Likely related to hepatic congestion in setting of new heart failure  Other chronic conditions were medically managed with home medications and formulary alternatives as necessary  PCP Follow-up Recommendations: BMP and mag to monitor electrolytes Ensure out pt f/u with cardiology and cardiac PET Ensure understanding of new medications Might benefit from urea breath test - stop PPI 2 weeks prior to this

## 2023-07-07 NOTE — Assessment & Plan Note (Addendum)
 BNP 1808 on arrival, and echocardiogram with EF of less than 20% with global LV hypokinesis, LV apex swirling and severely reduced RV function as well as moderate MVR.  Concern for Takotsubo cardiomyopathy versus myocardial contusion. CT Chest unremarkable. Lactic acid wnl. Patient with continued chest pain and dyspnea, satting well on room air. Describes postprandial onset. Telemetry unremarkable overnight. BP stable overnight. -Ordered EKG and Troponin this AM -Added protonix  40 daily for possible reflux symptoms given postprandial nature -Cardiology following, appreciate recommendations  -GDMT per cardiology  -S/p 40 IV lasix  in ED. Continue to diurese per cardiology  -Lidocaine  patch prn for chest region -Continuous cardiac monitoring  -Strict I's/O's -Daily weights -Keep K>4, Mag >2 -Monitor blood pressure closely and for signs of cardiogenic shock -Keep O2 saturations greater than 92% -AM CMP, Mag, CBC

## 2023-07-08 ENCOUNTER — Ambulatory Visit (HOSPITAL_COMMUNITY): Admit: 2023-07-08 | Payer: Medicaid Other | Admitting: Cardiology

## 2023-07-08 ENCOUNTER — Encounter (HOSPITAL_COMMUNITY)
Admission: EM | Disposition: A | Discharge status: Home / Self Care | Payer: Self-pay | Source: Home / Self Care | Attending: Family Medicine

## 2023-07-08 DIAGNOSIS — I509 Heart failure, unspecified: Secondary | ICD-10-CM | POA: Diagnosis not present

## 2023-07-08 HISTORY — PX: RIGHT HEART CATH AND CORONARY ANGIOGRAPHY: CATH118264

## 2023-07-08 LAB — POCT I-STAT EG7
Acid-Base Excess: 0 mmol/L (ref 0.0–2.0)
Acid-Base Excess: 1 mmol/L (ref 0.0–2.0)
Bicarbonate: 25.5 mmol/L (ref 20.0–28.0)
Bicarbonate: 26.2 mmol/L (ref 20.0–28.0)
Calcium, Ion: 1.2 mmol/L (ref 1.15–1.40)
Calcium, Ion: 1.21 mmol/L (ref 1.15–1.40)
HCT: 39 % (ref 36.0–46.0)
HCT: 40 % (ref 36.0–46.0)
Hemoglobin: 13.3 g/dL (ref 12.0–15.0)
Hemoglobin: 13.6 g/dL (ref 12.0–15.0)
O2 Saturation: 54 %
O2 Saturation: 56 %
Potassium: 4 mmol/L (ref 3.5–5.1)
Potassium: 4 mmol/L (ref 3.5–5.1)
Sodium: 140 mmol/L (ref 135–145)
Sodium: 141 mmol/L (ref 135–145)
TCO2: 27 mmol/L (ref 22–32)
TCO2: 28 mmol/L (ref 22–32)
pCO2, Ven: 42.5 mm[Hg] — ABNORMAL LOW (ref 44–60)
pCO2, Ven: 43.6 mm[Hg] — ABNORMAL LOW (ref 44–60)
pH, Ven: 7.387 (ref 7.25–7.43)
pH, Ven: 7.387 (ref 7.25–7.43)
pO2, Ven: 29 mm[Hg] — CL (ref 32–45)
pO2, Ven: 30 mm[Hg] — CL (ref 32–45)

## 2023-07-08 LAB — POCT I-STAT 7, (LYTES, BLD GAS, ICA,H+H)
Acid-base deficit: 2 mmol/L (ref 0.0–2.0)
Bicarbonate: 21.9 mmol/L (ref 20.0–28.0)
Calcium, Ion: 1.09 mmol/L — ABNORMAL LOW (ref 1.15–1.40)
HCT: 37 % (ref 36.0–46.0)
Hemoglobin: 12.6 g/dL (ref 12.0–15.0)
O2 Saturation: 93 %
Potassium: 3.6 mmol/L (ref 3.5–5.1)
Sodium: 142 mmol/L (ref 135–145)
TCO2: 23 mmol/L (ref 22–32)
pCO2 arterial: 33.5 mm[Hg] (ref 32–48)
pH, Arterial: 7.424 (ref 7.35–7.45)
pO2, Arterial: 66 mm[Hg] — ABNORMAL LOW (ref 83–108)

## 2023-07-08 LAB — CBC
HCT: 39.2 % (ref 36.0–46.0)
Hemoglobin: 13.2 g/dL (ref 12.0–15.0)
MCH: 29 pg (ref 26.0–34.0)
MCHC: 33.7 g/dL (ref 30.0–36.0)
MCV: 86.2 fL (ref 80.0–100.0)
Platelets: 275 10*3/uL (ref 150–400)
RBC: 4.55 MIL/uL (ref 3.87–5.11)
RDW: 12.7 % (ref 11.5–15.5)
WBC: 7.6 10*3/uL (ref 4.0–10.5)
nRBC: 0 % (ref 0.0–0.2)

## 2023-07-08 LAB — MAGNESIUM: Magnesium: 2.4 mg/dL (ref 1.7–2.4)

## 2023-07-08 LAB — HEPATIC FUNCTION PANEL
ALT: 52 U/L — ABNORMAL HIGH (ref 0–44)
AST: 27 U/L (ref 15–41)
Albumin: 3.5 g/dL (ref 3.5–5.0)
Alkaline Phosphatase: 36 U/L — ABNORMAL LOW (ref 38–126)
Bilirubin, Direct: 0.3 mg/dL — ABNORMAL HIGH (ref 0.0–0.2)
Indirect Bilirubin: 1.8 mg/dL — ABNORMAL HIGH (ref 0.3–0.9)
Total Bilirubin: 2.1 mg/dL — ABNORMAL HIGH (ref 0.0–1.2)
Total Protein: 6.3 g/dL — ABNORMAL LOW (ref 6.5–8.1)

## 2023-07-08 LAB — BASIC METABOLIC PANEL
Anion gap: 12 (ref 5–15)
BUN: 11 mg/dL (ref 6–20)
CO2: 23 mmol/L (ref 22–32)
Calcium: 9 mg/dL (ref 8.9–10.3)
Chloride: 106 mmol/L (ref 98–111)
Creatinine, Ser: 0.89 mg/dL (ref 0.44–1.00)
GFR, Estimated: >60 mL/min (ref >60)
Glucose, Bld: 109 mg/dL — ABNORMAL HIGH (ref 70–99)
Potassium: 4 mmol/L (ref 3.5–5.1)
Sodium: 141 mmol/L (ref 135–145)

## 2023-07-08 SURGERY — RIGHT HEART CATH AND CORONARY ANGIOGRAPHY
Anesthesia: LOCAL

## 2023-07-08 MED ORDER — HEPARIN SODIUM (PORCINE) 1000 UNIT/ML IJ SOLN
INTRAMUSCULAR | Status: AC
Start: 2023-07-08 — End: ?
  Filled 2023-07-08: qty 10

## 2023-07-08 MED ORDER — HEPARIN SODIUM (PORCINE) 1000 UNIT/ML IJ SOLN
INTRAMUSCULAR | Status: DC | PRN
  Administered 2023-07-08: 4000 [IU] via INTRAVENOUS

## 2023-07-08 MED ORDER — SIMETHICONE 80 MG PO CHEW
80.0000 mg | CHEWABLE_TABLET | Freq: Once | ORAL | Status: AC
  Administered 2023-07-08: 80 mg via ORAL
  Filled 2023-07-08: qty 1

## 2023-07-08 MED ORDER — MIDAZOLAM HCL 2 MG/2ML IJ SOLN
INTRAMUSCULAR | Status: AC
  Filled 2023-07-08: qty 2

## 2023-07-08 MED ORDER — HEPARIN (PORCINE) IN NACL 1000-0.9 UT/500ML-% IV SOLN
INTRAVENOUS | Status: DC | PRN
  Administered 2023-07-08 (×2): 500 mL

## 2023-07-08 MED ORDER — LIDOCAINE 5 % EX PTCH
1.0000 | MEDICATED_PATCH | CUTANEOUS | Status: DC
  Administered 2023-07-08 – 2023-07-13 (×4): 1 via TRANSDERMAL
  Filled 2023-07-08 (×5): qty 1

## 2023-07-08 MED ORDER — IOHEXOL 350 MG/ML SOLN
INTRAVENOUS | Status: DC | PRN
  Administered 2023-07-08: 25 mL

## 2023-07-08 MED ORDER — ENOXAPARIN SODIUM 40 MG/0.4ML IJ SOSY
40.0000 mg | PREFILLED_SYRINGE | INTRAMUSCULAR | Status: AC
  Administered 2023-07-09: 40 mg via SUBCUTANEOUS
  Filled 2023-07-08: qty 0.4

## 2023-07-08 MED ORDER — FUROSEMIDE 10 MG/ML IJ SOLN
80.0000 mg | Freq: Once | INTRAMUSCULAR | Status: AC
  Administered 2023-07-08: 80 mg via INTRAVENOUS
  Filled 2023-07-08: qty 8

## 2023-07-08 MED ORDER — LIDOCAINE HCL (PF) 1 % IJ SOLN
INTRAMUSCULAR | Status: AC
  Filled 2023-07-08: qty 30

## 2023-07-08 MED ORDER — FENTANYL CITRATE (PF) 100 MCG/2ML IJ SOLN
INTRAMUSCULAR | Status: DC | PRN
  Administered 2023-07-08: 50 ug via INTRAVENOUS
  Administered 2023-07-08: 25 ug via INTRAVENOUS

## 2023-07-08 MED ORDER — MIDAZOLAM HCL 2 MG/2ML IJ SOLN
INTRAMUSCULAR | Status: DC | PRN
  Administered 2023-07-08 (×2): 1 mg via INTRAVENOUS

## 2023-07-08 MED ORDER — VERAPAMIL HCL 2.5 MG/ML IV SOLN
INTRAVENOUS | Status: AC
  Filled 2023-07-08: qty 2

## 2023-07-08 MED ORDER — VERAPAMIL HCL 2.5 MG/ML IV SOLN
INTRAVENOUS | Status: DC | PRN
  Administered 2023-07-08: 10 mL via INTRA_ARTERIAL

## 2023-07-08 MED ORDER — LIDOCAINE HCL (PF) 1 % IJ SOLN
INTRAMUSCULAR | Status: DC | PRN
  Administered 2023-07-08: 2 mL via INTRADERMAL

## 2023-07-08 MED ORDER — LOSARTAN POTASSIUM 25 MG PO TABS: 12.5000 mg | ORAL_TABLET | Freq: Once | ORAL | Status: DC

## 2023-07-08 MED ORDER — LOSARTAN POTASSIUM 25 MG PO TABS
25.0000 mg | ORAL_TABLET | Freq: Every day | ORAL | Status: DC
Start: 2023-07-09 — End: 2023-07-09
  Filled 2023-07-08: qty 1

## 2023-07-08 MED ORDER — FENTANYL CITRATE (PF) 100 MCG/2ML IJ SOLN
INTRAMUSCULAR | Status: AC
  Filled 2023-07-08: qty 2

## 2023-07-08 MED ORDER — LOSARTAN POTASSIUM 25 MG PO TABS
12.5000 mg | ORAL_TABLET | Freq: Once | ORAL | Status: AC
  Administered 2023-07-08: 12.5 mg via ORAL
  Filled 2023-07-08: qty 1

## 2023-07-08 SURGICAL SUPPLY — 9 items
CATH 5FR JL3.5 JR4 ANG PIG MP (CATHETERS) IMPLANT
CATH BALLN WEDGE 5F 110CM (CATHETERS) IMPLANT
DEVICE RAD COMP TR BAND LRG (VASCULAR PRODUCTS) IMPLANT
GLIDESHEATH SLEND SS 6F .021 (SHEATH) IMPLANT
GUIDEWIRE INQWIRE 1.5J.035X260 (WIRE) IMPLANT
INQWIRE 1.5J .035X260CM (WIRE) ×1
PACK CARDIAC CATHETERIZATION (CUSTOM PROCEDURE TRAY) ×1 IMPLANT
SET ATX-X65L (MISCELLANEOUS) IMPLANT
SHEATH GLIDE SLENDER 4/5FR (SHEATH) IMPLANT

## 2023-07-08 NOTE — Progress Notes (Signed)
 TR band was started to deflate 3 cc after 45 minutes of its application as per order,first attempt was successful ,this RN deflated another 3 cc and 6 cc was left in TR band , in a minute it started to bleed 6 cc of air was added which stopped her bleeding ,cath lab notified  Pallete RN came to see the patient and Dr Morene made aware advice to start deflating after an hour, rt arm is elevated and blood pressure is being measured

## 2023-07-08 NOTE — Progress Notes (Signed)
 Daily Progress Note Intern Pager: [phone]  Patient name: Amber Franco Medical record number: [id number] Date of birth: [birth year] Age: 53 y.o. Gender: female  Primary Care Provider: Forest Coy, MD Consultants: Cardiology Code Status: FULL    Pt Overview and Major Events to Date:  15/25: Admitted to FMTS   Assessment and Plan:   Amber Franco is a 53 y.o. female PMH remote hx of HTN (not on meds), chronic back pain presenting with chest pain, orthopnea and dyspnea on exertion after fall onto plastic tab 4 days ago and recent family members passing.  Found to have new acute heart failure with EF <20%. Continues to have chest pain and dyspnea, satting well on room air. Cardiology following. Plan for catheterization today.   Assessment & Plan New onset of congestive heart failure (HCC) BNP 1808 on arrival, and echocardiogram with EF of less than 20% with global LV hypokinesis, LV apex swirling and severely reduced RV function as well as moderate MVR.  Concern for Takotsubo cardiomyopathy versus myocardial contusion. CT Chest unremarkable. Lactic acid wnl. Patient with continued chest pain and dyspnea, satting well on room air. Describes some postprandial onset. Telemetry unremarkable overnight. BP stable overnight. Satting well on room air. S/p IV lasix , down 2.2 L during admission. Heart cath with cardiology today.  -Post cath check - restart vte prophylaxis and diet  -Protonix  40 daily for possible reflux symptoms given postprandial nature -Cardiology following, appreciate recommendations  -GDMT per cardiology: spironolactone  12.5 daily, digoxin  0.125 daily -S/p 40 IV lasix  in ED, 80 IV lasix  07/07/23. Continue to diurese per cardiology  -Lidocaine  patch for chest region -Continuous cardiac monitoring  -Strict I's/O's -Daily weights -Keep K>4, Mag >2 -Keep O2 saturations greater than 92% -AM BMP, Mag, CBC LFT elevation ALT remains mildly elevated 54 >49 >52 this morning. Tbili  2.1 > 1.8 >2.1. No alcohol use. Hepatitis panel negative. Most likely related to hepatic congestion.   Chronic and Stable Issues: Sleep disturbances-she is unsure what medication she takes at home, continue melatonin nightly for now History of hypertension-states that she is now off of all medications due to improved lifestyle Back pain-add home flexeril  PRN if needing ?CVA hx-states that she has previously had a stroke in 2018 however based on documentation from neurology 10/15/2016 there was no objective evidence of acute neurologic deficits or previous strokes and MRI brain negative. She states this event of aphasia happened after her daughter died from an overdose of Molly  FEN/GI: NPO prior to cath PPx: Held, plan to resume after cath  Dispo: Home pending cardiac cath and clinical stability   Subjective:  Patient feeling a bit better this morning.  Still has some sternal chest pain at times.  Breathing still feels shallow.  Tolerated p.o. yesterday, no emesis, some nausea.  Objective: Temp:  [97.5 F (36.4 C)-98.3 F (36.8 C)] 98.3 F (36.8 C) (01/07 0417) Pulse Rate:  [89-112] 95 (01/07 0537) Resp:  [16-20] 20 (01/07 0537) BP: (90-115)/(69-95) 109/90 (01/07 0417) SpO2:  [92 %-100 %] 99 % (01/07 0537) Weight:  [78.7 kg] 78.7 kg (01/07 0537) Physical Exam: General: No acute distress CV: Soft S1/S2. No extra heart sounds.  Mild JVD. Reproducible chest pain in sternal region. Warm and well-perfused. No carotid bruit appreciated bilaterally.  Pulm: Breathing comfortably on room air. CTAB. No increased WOB. Abd: Tender in upper quadrants. Mild distention. MSK: Some tenderness to palpation of L neck and upper back sift tissue. No bony tenderness.  Skin:  Warm,  dry. No bruising noted on abdomen or chest.  Psych: Pleasant and appropriate.   Laboratory: Most recent CBC Lab Results  Component Value Date   WBC 7.6 07/08/2023   HGB 13.2 07/08/2023   HCT 39.2 07/08/2023   MCV 86.2  07/08/2023   PLT 275 07/08/2023   Most recent BMP    Latest Ref Rng & Units 07/08/2023    3:44 AM  BMP  Glucose 70 - 99 mg/dL 890   BUN 6 - 20 mg/dL 11   Creatinine 9.55 - 1.00 mg/dL 9.10   Sodium 864 - 854 mmol/L 141   Potassium 3.5 - 5.1 mmol/L 4.0   Chloride 98 - 111 mmol/L 106   CO2 22 - 32 mmol/L 23   Calcium  8.9 - 10.3 mg/dL 9.0     Amber Perkins, MD 07/08/2023, 7:31 AM  PGY-1, [facility] FPTS Intern pager: [phone], text pages welcome Secure chat group [facility] Teaching Service

## 2023-07-08 NOTE — Interval H&P Note (Signed)
 History and Physical Interval Note:  07/08/2023 1:22 PM  Amber Franco  has presented today for surgery, with the diagnosis of heart failure.  The various methods of treatment have been discussed with the patient and family. After consideration of risks, benefits and other options for treatment, the patient has consented to  Procedure(s): RIGHT/LEFT HEART CATH AND CORONARY ANGIOGRAPHY (N/A) as a surgical intervention.  The patient's history has been reviewed, patient examined, no change in status, stable for surgery.  I have reviewed the patient's chart and labs.  Questions were answered to the patient's satisfaction.     [name]

## 2023-07-08 NOTE — Progress Notes (Signed)
 Advanced Heart Failure Rounding Note  HF Cardiologist: Dr. Zenaida  Chief Complaint: Acute systolic CHF  Subjective:    Weight down 5 lb after IV lasix  yesterday. Dyspnea improving but still fatigues easily with ambulation.  Going for [facility] this am.     Objective:   Weight Range: 78.7 kg Body mass index is 31.73 kg/m.   Vital Signs:   Temp:  [97.5 F (36.4 C)-98.3 F (36.8 C)] 97.6 F (36.4 C) (01/07 0820) Pulse Rate:  [89-108] 90 (01/07 0820) Resp:  [16-20] 18 (01/07 0820) BP: (90-114)/(69-90) 114/89 (01/07 0820) SpO2:  [92 %-100 %] 99 % (01/07 0820) Weight:  [78.7 kg] 78.7 kg (01/07 0537) Last BM Date : 07/07/23  Weight change: Filed Weights   07/06/23 2216 07/07/23 0553 07/08/23 0537  Weight: 81 kg 80.9 kg 78.7 kg    Intake/Output:   Intake/Output Summary (Last 24 hours) at 07/08/2023 0832 Last data filed at 07/08/2023 0541 Gross per 24 hour  Intake 398.94 ml  Output 2250 ml  Net -1851.06 ml      Physical Exam    General:  Fatigued appearing. HEENT: Normal Neck: Thick neck. JVP 8 cm.  Cor: PMI nondisplaced. Regular rate & rhythm. No rubs, gallops or murmurs. Lungs: Clear Abdomen: Soft, nontender, nondistended.  Extremities: No cyanosis, clubbing, rash, edema Neuro: Alert & orientedx3. Affect pleasant   Telemetry   SR/ST 90s-100s  EKG    No new to review  Labs    CBC Recent Labs    07/07/23 0250 07/08/23 0344  WBC 7.2 7.6  HGB 12.0 13.2  HCT 35.0* 39.2  MCV 87.1 86.2  PLT 242 275   Basic Metabolic Panel Recent Labs    98/93/74 0250 07/07/23 1921 07/08/23 0344  NA 138 140 141  K 3.6 4.1 4.0  CL 108 108 106  CO2 21* 22 23  GLUCOSE 112* 116* 109*  BUN 8 13 11   CREATININE 0.91 0.96 0.89  CALCIUM  8.7* 9.0 9.0  MG 1.6*  --  2.4   Liver Function Tests Recent Labs    07/07/23 0250 07/08/23 0344  AST 28 27  ALT 49* 52*  ALKPHOS 34* 36*  BILITOT 1.8* 2.1*  PROT 5.8* 6.3*  ALBUMIN 3.2* 3.5   Recent Labs     07/06/23 1050  LIPASE 43   Cardiac Enzymes No results for input(s): CKTOTAL, CKMB, CKMBINDEX, TROPONINI in the last 72 hours.  BNP: BNP (last 3 results) Recent Labs    07/06/23 1050  BNP 1,808.6*    ProBNP (last 3 results) No results for input(s): PROBNP in the last 8760 hours.   D-Dimer Recent Labs    07/06/23 1743  DDIMER 1.78*   Hemoglobin A1C No results for input(s): HGBA1C in the last 72 hours. Fasting Lipid Panel No results for input(s): CHOL, HDL, LDLCALC, TRIG, CHOLHDL, LDLDIRECT in the last 72 hours. Thyroid Function Tests Recent Labs    07/07/23 0250  TSH 2.745    Other results:   Imaging    No results found.   Medications:     Scheduled Medications:  digoxin   0.125 mg Oral Daily   melatonin  5 mg Oral QHS   pantoprazole   40 mg Oral Daily   spironolactone   12.5 mg Oral Daily    Infusions:  sodium chloride  10 mL/hr at 07/08/23 0539    PRN Medications: acetaminophen , lidocaine , LORazepam     Patient Profile  53 y.o. female with history of HTN (off meds with lifestyle changes), sickle  cell trait. Admitted with acute systolic CHF.  Assessment/Plan  Acute systolic CHF -New diagnosis -Echo with EF < 20%, slow swirling flow at LV apex w/o thrombus, RV severely reduced, moderate MR -Etiology not certain. Has history of HTN but does not seem this has been uncontrolled.  Takotsubo stress cardiomyopathy is a consideration given recent stressors.  -R/LHC today.  -If no significant CAD on cath, may benefit from cMRI. -Further diuresis pending hemodynamics on RHC -Continue digoxin  0.125 mg daily -Continue spiro 12.5 mg daily -Add low-dose losartan  post cath.   2. HTN -BP not currently elevated -Meds as above   3. Elevated LFTs Elevated Tbili -Suspect may be d/t hepatic congestion -Trend    Length of Stay: 2  Clementina Mareno N, PA-C  07/08/2023, 8:32 AM  Advanced Heart Failure Team Pager [phone] (M-F; 7a -  5p)  Please contact [facility] for night-coverage after hours (5p -7a ) and weekends on [email or web address]

## 2023-07-08 NOTE — Assessment & Plan Note (Addendum)
 BNP 1808 on arrival, and echocardiogram with EF of less than 20% with global LV hypokinesis, LV apex swirling and severely reduced RV function as well as moderate MVR.  Concern for Takotsubo cardiomyopathy versus myocardial contusion. CT Chest unremarkable. Lactic acid wnl. Patient with continued chest pain and dyspnea, satting well on room air. Describes some postprandial onset. Telemetry unremarkable overnight. BP stable overnight. Satting well on room air. S/p IV lasix , down 2.2 L during admission. Heart cath with cardiology today.  -Post cath check - restart vte prophylaxis and diet  -Protonix  40 daily for possible reflux symptoms given postprandial nature -Cardiology following, appreciate recommendations  -GDMT per cardiology: spironolactone  12.5 daily, digoxin  0.125 daily -S/p 40 IV lasix  in ED, 80 IV lasix  07/07/23. Continue to diurese per cardiology  -Lidocaine  patch for chest region -Continuous cardiac monitoring  -Strict I's/O's -Daily weights -Keep K>4, Mag >2 -Keep O2 saturations greater than 92% -AM BMP, Mag, CBC

## 2023-07-08 NOTE — Plan of Care (Signed)
 FMTS Interim Progress Note  S: Saw patient for post-cath check. Patient feeling better. Improved chest pain, improved breathing. Tolerating PO. Has uncomfortable abdominal bloating.   O: BP 97/72 (BP Location: Left Arm)   Pulse 91   Temp 98.1 F (36.7 C) (Oral)   Resp 20   Ht 5' 2 (1.575 m)   Wt 78.7 kg   SpO2 97%   BMI 31.73 kg/m   General: No acute distress. Sleeping, easily awoken.  CV: Normal S1/S2. No extra heart sounds. JVP around clavicle. Warm and well-perfused. Pulm: Breathing comfortably on room air. CTAB. No increased WOB. Abd: Mild distention. Tender to palpation of epigastric region.  Skin:  Warm, dry. Psych: Pleasant and appropriate.   A/P: Acute heart failure Patient overall stable following heart cath, though with some soft BPs. Telemetry stable.   - Delay starting losartan  dosage to later this evening given soft BP, defer starting to tomorrow as needed - CTM vitals  - Fu heart failure recs  Bloating - Gas x for relief   Continue plan as otherwise indicated in FMTS daily progress note.   Diona Perkins, MD 07/08/2023, 5:34 PM PGY-1, [facility] Family [facility] Service pager [phone]

## 2023-07-08 NOTE — Assessment & Plan Note (Addendum)
 ALT remains mildly elevated 54 >49 >52 this morning. Tbili 2.1 > 1.8 >2.1. No alcohol use. Hepatitis panel negative. Most likely related to hepatic congestion.

## 2023-07-09 ENCOUNTER — Telehealth (HOSPITAL_COMMUNITY): Payer: Self-pay | Admitting: Pharmacy Technician

## 2023-07-09 ENCOUNTER — Encounter (HOSPITAL_COMMUNITY): Payer: Self-pay | Admitting: Cardiology

## 2023-07-09 ENCOUNTER — Inpatient Hospital Stay (HOSPITAL_COMMUNITY): Payer: Medicaid Other

## 2023-07-09 ENCOUNTER — Other Ambulatory Visit (HOSPITAL_COMMUNITY): Payer: Self-pay

## 2023-07-09 DIAGNOSIS — I340 Nonrheumatic mitral (valve) insufficiency: Secondary | ICD-10-CM

## 2023-07-09 DIAGNOSIS — I509 Heart failure, unspecified: Secondary | ICD-10-CM | POA: Diagnosis not present

## 2023-07-09 LAB — BASIC METABOLIC PANEL
Anion gap: 10 (ref 5–15)
BUN: 13 mg/dL (ref 6–20)
CO2: 23 mmol/L (ref 22–32)
Calcium: 8.9 mg/dL (ref 8.9–10.3)
Chloride: 103 mmol/L (ref 98–111)
Creatinine, Ser: 0.83 mg/dL (ref 0.44–1.00)
GFR, Estimated: >60 mL/min (ref >60)
Glucose, Bld: 116 mg/dL — ABNORMAL HIGH (ref 70–99)
Potassium: 3.6 mmol/L (ref 3.5–5.1)
Sodium: 136 mmol/L (ref 135–145)

## 2023-07-09 LAB — CBC
HCT: 42.1 % (ref 36.0–46.0)
Hemoglobin: 14.4 g/dL (ref 12.0–15.0)
MCH: 29.3 pg (ref 26.0–34.0)
MCHC: 34.2 g/dL (ref 30.0–36.0)
MCV: 85.7 fL (ref 80.0–100.0)
Platelets: 265 10*3/uL (ref 150–400)
RBC: 4.91 MIL/uL (ref 3.87–5.11)
RDW: 12.6 % (ref 11.5–15.5)
WBC: 8.7 10*3/uL (ref 4.0–10.5)
nRBC: 0 % (ref 0.0–0.2)

## 2023-07-09 LAB — MAGNESIUM: Magnesium: 1.9 mg/dL (ref 1.7–2.4)

## 2023-07-09 MED ORDER — FLUTICASONE PROPIONATE 50 MCG/ACT NA SUSP
1.0000 | Freq: Every day | NASAL | Status: DC
Start: 2023-07-09 — End: 2023-07-13
  Administered 2023-07-09 – 2023-07-12 (×4): 1 via NASAL
  Filled 2023-07-09: qty 16

## 2023-07-09 MED ORDER — SIMETHICONE 80 MG PO CHEW
80.0000 mg | CHEWABLE_TABLET | Freq: Once | ORAL | Status: AC
  Administered 2023-07-09: 80 mg via ORAL
  Filled 2023-07-09: qty 1

## 2023-07-09 MED ORDER — MAGNESIUM SULFATE IN D5W 1-5 GM/100ML-% IV SOLN
1.0000 g | Freq: Once | INTRAVENOUS | Status: AC
  Administered 2023-07-09: 1 g via INTRAVENOUS
  Filled 2023-07-09: qty 100

## 2023-07-09 MED ORDER — LOSARTAN POTASSIUM 25 MG PO TABS
25.0000 mg | ORAL_TABLET | Freq: Every day | ORAL | Status: DC
  Administered 2023-07-09: 25 mg via ORAL
  Filled 2023-07-09: qty 1

## 2023-07-09 MED ORDER — GADOBUTROL 1 MMOL/ML IV SOLN
10.0000 mL | Freq: Once | INTRAVENOUS | Status: AC | PRN
  Administered 2023-07-09: 10 mL via INTRAVENOUS

## 2023-07-09 MED ORDER — LORATADINE 10 MG PO TABS
10.0000 mg | ORAL_TABLET | Freq: Every day | ORAL | Status: DC
  Administered 2023-07-09 – 2023-07-13 (×5): 10 mg via ORAL
  Filled 2023-07-09 (×5): qty 1

## 2023-07-09 MED ORDER — FUROSEMIDE 40 MG PO TABS
40.0000 mg | ORAL_TABLET | Freq: Every day | ORAL | Status: DC
Start: 2023-07-10 — End: 2023-07-11
  Administered 2023-07-10 – 2023-07-11 (×2): 40 mg via ORAL
  Filled 2023-07-09 (×3): qty 1

## 2023-07-09 MED ORDER — FUROSEMIDE 40 MG PO TABS
40.0000 mg | ORAL_TABLET | Freq: Every day | ORAL | Status: DC
  Filled 2023-07-09 (×2): qty 1

## 2023-07-09 MED ORDER — DAPAGLIFLOZIN PROPANEDIOL 10 MG PO TABS
10.0000 mg | ORAL_TABLET | Freq: Every day | ORAL | Status: DC
  Administered 2023-07-09 – 2023-07-13 (×5): 10 mg via ORAL
  Filled 2023-07-09 (×5): qty 1

## 2023-07-09 MED ORDER — POTASSIUM CHLORIDE CRYS ER 20 MEQ PO TBCR
40.0000 meq | EXTENDED_RELEASE_TABLET | Freq: Once | ORAL | Status: AC
  Administered 2023-07-09: 40 meq via ORAL
  Filled 2023-07-09: qty 2

## 2023-07-09 NOTE — Evaluation (Signed)
 Occupational Therapy Evaluation Patient Details Name: Amber Franco MRN: [id number] DOB: Aug 03, 1970 Today's Date: 07/09/2023   History of Present Illness 52yo F who presented to the ED on 1/6/24with c/o DOE, orthopnea, chest pain as well as a fall into a plastic tub. Admitted for acute systolic CHF. PMH HTN, sickle cell trait, CVA   Clinical Impression   PTA, pt lived alone and was independent. Upon eval, pt presents with new HF diagnosis and knowledge of heart failure; slightly decreased activity tolerance but mod I for ADL and IADL. Pt benefiting from heart failure education and handout with good carryover of education provided. Additionally reviewed positive coping strategies. No further acute OT needs identified OT to sign off. Thank you for this order.       If plan is discharge home, recommend the following: Other (comment) (on pt request)    Functional Status Assessment  Patient has had a recent decline in their functional status and demonstrates the ability to make significant improvements in function in a reasonable and predictable amount of time.  Equipment Recommendations  None recommended by OT    Recommendations for Other Services       Precautions / Restrictions Precautions Precautions: None Restrictions Weight Bearing Restrictions Per Provider Order: No      Mobility Bed Mobility Overal bed mobility: Independent                  Transfers Overall transfer level: Independent Equipment used: None                      Balance Overall balance assessment: Independent                                         ADL either performed or assessed with clinical judgement   ADL Overall ADL's : Modified independent                                       General ADL Comments: Up in room on arrival putting linnen in linned bag on bathroom door with NT     Vision         Perception         Praxis          Pertinent Vitals/Pain Pain Assessment Pain Assessment: Faces Faces Pain Scale: No hurt     Extremity/Trunk Assessment Upper Extremity Assessment Upper Extremity Assessment: Overall WFL for tasks assessed   Lower Extremity Assessment Lower Extremity Assessment: Defer to PT evaluation   Cervical / Trunk Assessment Cervical / Trunk Assessment: Normal   Communication Communication Communication: No apparent difficulties   Cognition Arousal: Alert Behavior During Therapy: WFL for tasks assessed/performed Overall Cognitive Status: Within Functional Limits for tasks assessed                                 General Comments: internally distracted by her stressors at times.     General Comments  PT saw and initially reported OT can screen but then stated pt may benefit from HF education. Taking pt handout with green (no action needed), yellow (call PCP), and red zones (ED) as well s generalized education for heart failure managemnet. Pt very collaborating and able to verbalize understanding  with examples for each zone as well as heart failure management. Additionally provided [facility] handout and had pt identify positive coping strategies for current stressors in her life.    Exercises     Shoulder Instructions      Home Living Family/patient expects to be discharged to:: Private residence Living Arrangements: Alone   Type of Home: House Home Access: Ramped entrance     Home Layout: Two level Alternate Level Stairs-Number of Steps: flight, can stay downstairs   Bathroom Shower/Tub: Chief Strategy Officer: Handicapped height     Home Equipment: Shower seat   Additional Comments: pt's father had shower seat she can use      Prior Functioning/Environment Prior Level of Function : Independent/Modified Independent             Mobility Comments: came to [place] from [place] 3 weeks before thanksgiving, very active and independent          OT Problem  List: Decreased activity tolerance;Cardiopulmonary status limiting activity      OT Treatment/Interventions:      OT Goals(Current goals can be found in the care plan section) Acute Rehab OT Goals Patient Stated Goal: manage stress OT Goal Formulation: With patient  OT Frequency:      Co-evaluation              AM-PAC OT 6 Clicks Daily Activity     Outcome Measure Help from another person eating meals?: None Help from another person taking care of personal grooming?: None Help from another person toileting, which includes using toliet, bedpan, or urinal?: None Help from another person bathing (including washing, rinsing, drying)?: None Help from another person to put on and taking off regular upper body clothing?: None Help from another person to put on and taking off regular lower body clothing?: None 6 Click Score: 24   End of Session Nurse Communication: Mobility status  Activity Tolerance: Patient tolerated treatment well Patient left: in bed;with call bell/phone within reach  OT Visit Diagnosis: Other (comment) (decreased knowledge of heart failure management)                Time: 8754-8682 OT Time Calculation (min): 32 min Charges:  OT General Charges $OT Visit: 1 Visit OT Evaluation $OT Eval Low Complexity: 1 Low OT Treatments $Self Care/Home Management : 8-22 mins  Amber Franco, OTD, OTR/L [facility] Acute Rehabilitation Office: [phone]   Amber Franco 07/09/2023, 2:25 PM

## 2023-07-09 NOTE — Assessment & Plan Note (Signed)
 ALT mildly elevated 54 >49 >52. Tbili 2.1 > 1.8 >2.1. No alcohol use. Hepatitis panel negative. Most likely related to hepatic congestion.

## 2023-07-09 NOTE — Progress Notes (Signed)
 Advanced Heart Failure Rounding Note  HF Cardiologist: Dr. Zenaida  Chief Complaint: Acute HFrEF  Subjective:   Yesterday diuresed with IV lasix   after heart cath.    Tearful. Denies SOB.  Objective:   Weight Range: 77 kg Body mass index is 31.05 kg/m.   Vital Signs:   Temp:  [97.6 F (36.4 C)-98.7 F (37.1 C)] 98.7 F (37.1 C) (01/08 0505) Pulse Rate:  [0-107] 92 (01/08 0505) Resp:  [15-22] 18 (01/08 0505) BP: (90-114)/(63-94) 97/77 (01/08 0505) SpO2:  [91 %-99 %] 97 % (01/08 0505) Weight:  [77 kg] 77 kg (01/08 0506) Last BM Date : 07/07/23  Weight change: Filed Weights   07/07/23 0553 07/08/23 0537 07/09/23 0506  Weight: 80.9 kg 78.7 kg 77 kg    Intake/Output:   Intake/Output Summary (Last 24 hours) at 07/09/2023 0757 Last data filed at 07/09/2023 0509 Gross per 24 hour  Intake 1053.75 ml  Output 2750 ml  Net -1696.25 ml      Physical Exam  General: Tearful.  No resp difficulty HEENT: normal Neck: supple. JVP 5-6 . Carotids 2+ bilat; no bruits. No lymphadenopathy or thryomegaly appreciated. Cor: PMI nondisplaced. Regular rate & rhythm. No rubs, gallops or murmurs. Lungs: clear Abdomen: soft, nontender, nondistended. No hepatosplenomegaly. No bruits or masses. Good bowel sounds. Extremities: no cyanosis, clubbing, rash, edema Neuro: alert & orientedx3, cranial nerves grossly intact. moves all 4 extremities w/o difficulty. Affect pleasant   Telemetry   SR-ST 90-100s  EKG    No new to review  Labs    CBC Recent Labs    07/08/23 0344 07/08/23 1346 07/08/23 1348 07/09/23 0359  WBC 7.6  --   --  8.7  HGB 13.2   < > 12.6 14.4  HCT 39.2   < > 37.0 42.1  MCV 86.2  --   --  85.7  PLT 275  --   --  265   < > = values in this interval not displayed.   Basic Metabolic Panel Recent Labs    98/92/74 0344 07/08/23 1346 07/08/23 1348 07/09/23 0359  NA 141   < > 142 136  K 4.0   < > 3.6 3.6  CL 106  --   --  103  CO2 23  --   --  23  GLUCOSE  109*  --   --  116*  BUN 11  --   --  13  CREATININE 0.89  --   --  0.83  CALCIUM  9.0  --   --  8.9  MG 2.4  --   --  1.9   < > = values in this interval not displayed.   Liver Function Tests Recent Labs    07/07/23 0250 07/08/23 0344  AST 28 27  ALT 49* 52*  ALKPHOS 34* 36*  BILITOT 1.8* 2.1*  PROT 5.8* 6.3*  ALBUMIN 3.2* 3.5   Recent Labs    07/06/23 1050  LIPASE 43   Cardiac Enzymes No results for input(s): CKTOTAL, CKMB, CKMBINDEX, TROPONINI in the last 72 hours.  BNP: BNP (last 3 results) Recent Labs    07/06/23 1050  BNP 1,808.6*    ProBNP (last 3 results) No results for input(s): PROBNP in the last 8760 hours.   D-Dimer Recent Labs    07/06/23 1743  DDIMER 1.78*   Hemoglobin A1C No results for input(s): HGBA1C in the last 72 hours. Fasting Lipid Panel No results for input(s): CHOL, HDL, LDLCALC, TRIG, CHOLHDL, LDLDIRECT  in the last 72 hours. Thyroid Function Tests Recent Labs    07/07/23 0250  TSH 2.745    Other results:   Imaging    CARDIAC CATHETERIZATION Result Date: 07/08/2023 HEMODYNAMICS: RA:       8 mmHg (mean) PA:       48/32 mmHg (39 mean) PCWP: 25 mmHg (mean)    Estimated Fick CO/CI   3.55L/min, 1.95L/min/m2    TPG  14  mmHg     PVR  3.94 Wood Units PAPi  2 SVR 1757 dynes/sec/cm-5  IMPRESSION: Right heart catheterization and coronary angiography for new LV failure. Normal co dominant coronary arteriography. Normal right and elevated left heart filling pressures. 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 Moderately reduced cardiac output/index by assumed Fick. Elevated SVR. RECOMMENDATIONS: Resume IV diuresis, though elevated wedge more likely a function of low pulse pressure and elevated SVR. Start afterload reduction with low dose losartan , hopeful transition to entresto. CMR ordered.    Medications:     Scheduled Medications:  digoxin   0.125 mg Oral Daily   enoxaparin  (LOVENOX )  injection  40 mg Subcutaneous Q24H   lidocaine   1 patch Transdermal Q24H   losartan   25 mg Oral Daily   melatonin  5 mg Oral QHS   pantoprazole   40 mg Oral Daily   spironolactone   12.5 mg Oral Daily    Infusions:  magnesium  sulfate bolus IVPB 1 g (07/09/23 0705)    PRN Medications: acetaminophen , LORazepam     Patient Profile  53 y.o. female with history of HTN (off meds with lifestyle changes), sickle cell trait.  Admitted with acute systolic CHF.  Assessment/Plan  Acute systolic CHF --Echo with EF < 79%, slow swirling flow at LV apex w/o thrombus, RV severely reduced, moderate MR.  -RHC/LHC- normal cors, elevated SVR, PCWP 25, CI 1.9.  -CMRI today to further assess etiology.  - Volume status looks much better. Overall diuresed 10 pounds.  - Start lasix  po 40 mg daily.  -Continue digoxin  0.125 mg daily - Continue losartan  25 mg daily -Continue spiro 12.5 mg daily -Add farxiga  10 mg daily  - Renal function stable.  - Consult cardiac rehab.    2. HTN -Soft today    3. Elevated LFTs Elevated Tbili -Suspect may be d/t hepatic congestion   Consult cardiac rehab.  She will need meds from [facility] at d/c  I have messaged outpatient HF SW team to follow after discharge. Needs to switch [place] Medicaid given she plans to stay in [place].     Length of Stay: 3  Lamyra Malcolm, NP  07/09/2023, 7:57 AM  Advanced Heart Failure Team Pager [phone] (M-F; 7a - 5p)  Please contact [facility] for night-coverage after hours (5p -7a ) and weekends on [email or web address]

## 2023-07-09 NOTE — Progress Notes (Signed)
   07/09/23 1104  Spiritual Encounters  Type of Visit Follow up  Care provided to: Patient  Conversation partners present during encounter Nurse;Physician  Referral source Patient request  Reason for visit Urgent spiritual support  OnCall Visit No  Spiritual Framework  Presenting Themes Coping tools;Impactful experiences and emotions;Significant life change;Meaning/purpose/sources of inspiration;Values and beliefs;Goals in life/care  Values/beliefs Faith  Community/Connection None  Needs/Challenges/Barriers lack of support/community,  Patient Stress Factors Loss;Family relationships;Major life changes;Loss of control  Goals  Self/Personal Goals start over  Interventions  Spiritual Care Interventions Made Compassionate presence;Reflective listening;Normalization of emotions;Explored values/beliefs/practices/strengths;Meaning making;Supported grief process  Intervention Outcomes  Outcomes Reduced isolation;Connection to spiritual care;Awareness of support   Chaplain responded to request for emotional and spiritual support. There was no family present at bedside. Patient was going through spiritual distress because she lost both her husband and her father last year. They are holding her father's funeral today and she is not able to attend. She does not have any family support. Patient belongs to the Christian faith. Her faith is very important to her. Since she doesn't have any support she feels isolated and lonely. Chaplain journeyed with patient through the grief process and provided compassionate presence. Patient expressed thanks and appreciation. Chaplain remains available when needed.

## 2023-07-09 NOTE — Progress Notes (Signed)
 Daily Progress Note Intern Pager: [phone]  Patient name: Amber Franco Medical record number: [id number] Date of birth: Jun 21, 1971 Age: 53 y.o. Gender: female  Primary Care Provider: Forest Coy, MD Consultants: Cardiology Code Status: FULL    Pt Overview and Major Events to Date:  07/06/23: Admitted to FMTS 07/08/23: Heart cath without ischemic disease, showed reduced cardiac output and elevated L filling pressures.    Assessment and Plan:   Amber Franco is a 53 y.o. female PMH remote hx of HTN (not on meds), chronic back pain presenting with chest pain, orthopnea and dyspnea on exertion after fall onto plastic tab 4 days ago and recent family members passing.  Found to have new acute heart failure with EF <20%. Continues to have chest pain and dyspnea, satting well on room air. Cardiology following.  Heart cath 07/08/23 without ischemic disease, showed reduced cardiac output and elevated L filling pressures.  Assessment & Plan New onset of congestive heart failure (HCC) BNP 1808 on arrival, and echocardiogram with EF of less than 20% with global LV hypokinesis, LV apex swirling, severely reduced RV function, moderate MVR.  S/p IV lasix , down 3.2 L during admission. S/p heart cath without ischemic disease, showed reduced cardiac output and elevated L filling pressures. Patient tolerated cath well and remains stable on room air. Telemetry unremarkable overnight. Some soft BPs overnight. -Cardiology following, appreciate recommendations  -GDMT per cardiology: spironolactone  12.5, digoxin  0.125, Farxiga  10, losartan  25 daily   - Given soft BP overnight, will recheck patient BP this afternoon prior to losartan  admin  -S/p IV lasix . Continue to diurese per cardiology  -Follow-up cardiac MRI today -Protonix  40 daily for possible reflux symptoms given postprandial nature -Lidocaine  patch for chest region -Continuous cardiac monitoring  -Strict I's/O's -Daily weights -AM BMP, Mag, CBC -  replete electrolytes as indicated  LFT elevation ALT mildly elevated 54 >49 >52. Tbili 2.1 > 1.8 >2.1. No alcohol use. Hepatitis panel negative. Most likely related to hepatic congestion.   Chronic and Stable Issues: Sleep disturbances-she is unsure what medication she takes at home, continue melatonin nightly for now History of hypertension-states that she is now off of all medications due to improved lifestyle Back pain-add home flexeril  PRN if needing Reported CVA hx-states that she has previously had a stroke in 2018 however based on documentation from neurology 10/15/2016 there was no objective evidence of acute neurologic deficits or previous strokes and MRI brain negative.    FEN/GI: Reg diet  PPx: Lovenox  Dispo: Home pending cMRI and clinical stability   Subjective:  Patient feeling better this morning.  Improving chest pain and improving breathing.  She was able to get to and from the bathroom by herself this morning.  Normal bowel movement earlier today.  Some continued abdominal bloating, reports that Gas-X helped her yesterday.  Would like something for her allergies.  Objective: Temp:  [97.6 F (36.4 C)-98.7 F (37.1 C)] 98.7 F (37.1 C) (01/08 0505) Pulse Rate:  [0-107] 92 (01/08 0505) Resp:  [15-22] 18 (01/08 0505) BP: (90-114)/(63-94) 97/77 (01/08 0505) SpO2:  [91 %-99 %] 97 % (01/08 0505) Weight:  [77 kg] 77 kg (01/08 0506) Physical Exam: General: Well-appearing. Resting comfortably in room. CV: Normal S1/S2. No extra heart sounds. Warm and well-perfused. Pulm: Breathing comfortably on room air. CTAB. No increased WOB. Abd: Soft, mild distention.  Ext: No LE edema or tenderness bilaterally.  Skin:  Warm, dry. Psych: Pleasant and appropriate.   Laboratory: Most recent CBC Lab Results  Component Value Date   WBC 8.7 07/09/2023   HGB 14.4 07/09/2023   HCT 42.1 07/09/2023   MCV 85.7 07/09/2023   PLT 265 07/09/2023   Most recent BMP    Latest Ref Rng & Units  07/09/2023    3:59 AM  BMP  Glucose 70 - 99 mg/dL 883   BUN 6 - 20 mg/dL 13   Creatinine 9.55 - 1.00 mg/dL 9.16   Sodium 864 - 854 mmol/L 136   Potassium 3.5 - 5.1 mmol/L 3.6   Chloride 98 - 111 mmol/L 103   CO2 22 - 32 mmol/L 23   Calcium  8.9 - 10.3 mg/dL 8.9    Heart cath 01/30/73 without ischemic disease, showed reduced cardiac output and elevated L filling pressures.   Diona Perkins, MD 07/09/2023, 8:14 AM  PGY-1, [facility] Health Family Medicine FPTS Intern pager: [phone], text pages welcome Secure chat group [facility] Teaching Service

## 2023-07-09 NOTE — TOC Progression Note (Signed)
 Transition of Care [facility]) - Progression Note    Patient Details  Name: Amber Franco MRN: [id number] Date of Birth: 1971/05/19  Transition of Care [place]) CM/SW Contact  [name], LCSWA Phone Number: 07/09/2023, 10:36 AM  Clinical Narrative:   HF CSW followed up with the financial counselors to confirm that the pt has [place] Medicaid. [place] Medicaid is the pts primary insurance per her chart. Pt did inquire about disability. Financial counselors will follow up with pt about disability.   TOC will continue following.     Expected Discharge Plan: Home/Self Care Barriers to Discharge: Continued Medical Work up  Expected Discharge Plan and Services       Living arrangements for the past 2 months: Single Family Home                                       Social Determinants of Health (SDOH) Interventions SDOH Screenings   Food Insecurity: No Food Insecurity (07/07/2023)  Housing: Low Risk  (07/07/2023)  Transportation Needs: No Transportation Needs (07/07/2023)  Utilities: Not At Risk (07/07/2023)  Financial Resource Strain: Low Risk  (06/14/2019)   Received from [facility] and [employer], [employer] and [employer]  Physical Activity: Inactive (06/14/2019)   Received from [employer] and [employer], [employer] and [employer]  Social Connections: Unknown (06/14/2019)   Received from [employer] and [employer], [employer], [employer]consulting) [employer]iliates and [employer]  Stress: No Stress Concern Present (06/14/2019)   Received from [employer] and [employer], [employer] Affiliates and [employer]  Tobacco Use: High Risk (07/09/2023)    Readmission Risk Interventions     No data to display

## 2023-07-09 NOTE — Telephone Encounter (Signed)
 Pharmacy Patient Advocate Encounter  Received notification from [facility] that Prior Authorization for Farxiga  10MG  tablets has been APPROVED from 07/09/2023 to 07/08/2024. Ran test claim, Copay is $4.00. This test claim was processed through [facility]- copay amounts may vary at other pharmacies due to pharmacy/plan contracts, or as the patient moves through the different stages of their insurance plan.   PA #/Case ID/Reference #: [id number] Key: [id number]

## 2023-07-09 NOTE — Assessment & Plan Note (Addendum)
 BNP 1808 on arrival, and echocardiogram with EF of less than 20% with global LV hypokinesis, LV apex swirling, severely reduced RV function, moderate MVR.  S/p IV lasix , down 3.2 L during admission. S/p heart cath without ischemic disease, showed reduced cardiac output and elevated L filling pressures. Patient tolerated cath well and remains stable on room air. Telemetry unremarkable overnight. Some soft BPs overnight. -Cardiology following, appreciate recommendations  -GDMT per cardiology: spironolactone  12.5, digoxin  0.125, Farxiga  10, losartan  25 daily   - Given soft BP overnight, will recheck patient BP this afternoon prior to losartan  admin  -S/p IV lasix . Continue to diurese per cardiology  -Follow-up cardiac MRI today -Protonix  40 daily for possible reflux symptoms given postprandial nature -Lidocaine  patch for chest region -Continuous cardiac monitoring  -Strict I's/O's -Daily weights -AM BMP, Mag, CBC - replete electrolytes as indicated

## 2023-07-09 NOTE — Evaluation (Signed)
 Physical Therapy Evaluation Patient Details Name: Amber Franco MRN: [id number] DOB: [birth year] Today's Date: 07/09/2023  History of Present Illness  52yo F who presented to the ED on 1/6/24with c/o DOE, orthopnea, chest pain as well as a fall into a plastic tub. Admitted for acute systolic CHF. PMH HTN, sickle cell trait, CVA  Clinical Impression   Pt received in bed, pleasant and cooperative with PT. Able to mobilize well in session on an independent basis, no skilled PT services indicated at this time. Will make sure she is working with mobility team to prevent strength/conditioning loss while hospitalized but do not anticipate skilled PT services will be needed at DC.         If plan is discharge home, recommend the following: Other (comment) (no needs)   Can travel by private vehicle        Equipment Recommendations None recommended by PT  Recommendations for Other Services       Functional Status Assessment Patient has not had a recent decline in their functional status     Precautions / Restrictions Precautions Precautions: None Restrictions Weight Bearing Restrictions Per Provider Order: No      Mobility  Bed Mobility               General bed mobility comments: sitting at EOB upon entry    Transfers Overall transfer level: Independent Equipment used: None                    Ambulation/Gait Ambulation/Gait assistance: Independent Gait Distance (Feet): 200 Feet Assistive device: None Gait Pattern/deviations: WFL(Within Functional Limits), Step-through pattern       General Gait Details: WNL, no LOB noted  Stairs            Wheelchair Mobility     Tilt Bed    Modified Rankin (Stroke Patients Only)       Balance Overall balance assessment: Independent                                           Pertinent Vitals/Pain Pain Assessment Pain Assessment: 0-10 Pain Score: 5  Pain Location: chest pain Pain  Descriptors / Indicators: Discomfort Pain Intervention(s): Limited activity within patient's tolerance, Monitored during session    Home Living Family/patient expects to be discharged to:: Private residence Living Arrangements: Alone   Type of Home: House Home Access: Ramped entrance     Alternate Level Stairs-Number of Steps: flight, can stay downstairs Home Layout: Two level Home Equipment: None      Prior Function Prior Level of Function : Independent/Modified Independent             Mobility Comments: came to  from [place] 3 weeks before thanksgiving, very active and independent       Extremity/Trunk Assessment   Upper Extremity Assessment Upper Extremity Assessment: Overall WFL for tasks assessed    Lower Extremity Assessment Lower Extremity Assessment: Overall WFL for tasks assessed    Cervical / Trunk Assessment Cervical / Trunk Assessment: Normal  Communication   Communication Communication: No apparent difficulties  Cognition Arousal: Alert Behavior During Therapy: [facility] for tasks assessed/performed  General Comments      Exercises     Assessment/Plan    PT Assessment Patient does not need any further PT services  PT Problem List         PT Treatment Interventions      PT Goals (Current goals can be found in the Care Plan section)  Acute Rehab PT Goals Patient Stated Goal: feel better PT Goal Formulation: With patient Time For Goal Achievement: 07/23/23 Potential to Achieve Goals: Good    Frequency       Co-evaluation               AM-PAC PT 6 Clicks Mobility  Outcome Measure Help needed turning from your back to your side while in a flat bed without using bedrails?: None Help needed moving from lying on your back to sitting on the side of a flat bed without using bedrails?: None Help needed moving to and from a bed to a chair (including a wheelchair)?: None Help  needed standing up from a chair using your arms (e.g., wheelchair or bedside chair)?: None Help needed to walk in hospital room?: None Help needed climbing 3-5 steps with a railing? : A Little 6 Click Score: 23    End of Session   Activity Tolerance: Patient tolerated treatment well Patient left: in bed;with nursing/sitter in room Nurse Communication: Mobility status PT Visit Diagnosis: Pain Pain - Right/Left:  (chest) Pain - part of body:  (chest)    Time: 8770-8750 PT Time Calculation (min) (ACUTE ONLY): 20 min   Charges:   PT Evaluation $PT Eval Low Complexity: 1 Low   PT General Charges $$ ACUTE PT VISIT: 1 Visit       Josette Rough, PT, DPT 07/09/23 12:56 PM

## 2023-07-09 NOTE — Telephone Encounter (Signed)
 Pharmacy Patient Advocate Encounter  Insurance verification completed.    The patient is insured through [facility]. Patient has [employer], may use a copay card, and/or apply for patient assistance if available.    Ran test claim for Entresto and the current 30 day co-pay is $4.00. Ran test claim for Jardiance and it requires a Prior Authorization. Ran test claim for Farxiga  and it requires a Prior Authorization.   This test claim was processed through [facility]- copay amounts may vary at other pharmacies due to pharmacy/plan contracts, or as the patient moves through the different stages of their insurance plan.

## 2023-07-09 NOTE — Discharge Instructions (Addendum)
 Dear Amber Franco,  Thank you for letting us  participate in your care. You were diagnosed with New onset of congestive heart failure (HCC). You were treated with diuretics to remove extra fluid form your body and started on new medications to help protect your heart.  You had a heart catheter procedure done which showed no reduced blood flow to your heart, which is good news. The MRI of your heart showed that your heart has a decreased ability to effectively pump blood. The Cardiology Heart Failure team followed you while you were here and provided recommendations throughout your stay.   POST-HOSPITAL & CARE INSTRUCTIONS See the medication section of this packet and take all new medications as prescribed.  - Of note, please take the Lasix  40mg  medication as needed once per day for weight gain more than 3 pounds.  Please go to all of your follow up appointments as scheduled. You have appointments with Cardiology and with your new PCP clinic.   DOCTOR'S APPOINTMENT   Future Appointments  Date Time Provider Department Center  07/14/2023 10:10 AM ACCESS TO CARE POOL [facility]  07/17/2023 10:20 AM Zenaida Morene PARAS, MD [facility] None  07/24/2023 10:45 AM Gabino Boga, MD [facility]  08/07/2023 10:05 AM Diona Perkins, MD [facility]   Take care and be well!    Family Medicine Teaching Service Inpatient Team [place]  [facility]  [address] [place], [place] [address] [phone]

## 2023-07-10 ENCOUNTER — Other Ambulatory Visit (HOSPITAL_COMMUNITY): Payer: Self-pay

## 2023-07-10 DIAGNOSIS — I509 Heart failure, unspecified: Secondary | ICD-10-CM | POA: Diagnosis not present

## 2023-07-10 LAB — BASIC METABOLIC PANEL
Anion gap: 8 (ref 5–15)
BUN: 12 mg/dL (ref 6–20)
CO2: 22 mmol/L (ref 22–32)
Calcium: 9.1 mg/dL (ref 8.9–10.3)
Chloride: 107 mmol/L (ref 98–111)
Creatinine, Ser: 0.87 mg/dL (ref 0.44–1.00)
GFR, Estimated: >60 mL/min (ref >60)
Glucose, Bld: 99 mg/dL (ref 70–99)
Potassium: 4.2 mmol/L (ref 3.5–5.1)
Sodium: 137 mmol/L (ref 135–145)

## 2023-07-10 LAB — CBC
HCT: 41.6 % (ref 36.0–46.0)
Hemoglobin: 14 g/dL (ref 12.0–15.0)
MCH: 29.4 pg (ref 26.0–34.0)
MCHC: 33.7 g/dL (ref 30.0–36.0)
MCV: 87.2 fL (ref 80.0–100.0)
Platelets: 275 10*3/uL (ref 150–400)
RBC: 4.77 MIL/uL (ref 3.87–5.11)
RDW: 12.6 % (ref 11.5–15.5)
WBC: 8.6 10*3/uL (ref 4.0–10.5)
nRBC: 0 % (ref 0.0–0.2)

## 2023-07-10 LAB — MAGNESIUM: Magnesium: 2.1 mg/dL (ref 1.7–2.4)

## 2023-07-10 MED ORDER — LOSARTAN POTASSIUM 25 MG PO TABS: 25.0000 mg | ORAL_TABLET | Freq: Every day | ORAL | Status: DC

## 2023-07-10 MED ORDER — LOSARTAN POTASSIUM 25 MG PO TABS: 12.5000 mg | ORAL_TABLET | Freq: Every day | ORAL | Status: DC

## 2023-07-10 MED ORDER — LOSARTAN POTASSIUM 25 MG PO TABS
25.0000 mg | ORAL_TABLET | Freq: Every day | ORAL | Status: DC
  Administered 2023-07-10: 25 mg via ORAL
  Filled 2023-07-10: qty 1

## 2023-07-10 NOTE — Progress Notes (Signed)
 CARDIAC REHAB PHASE I   HF education including HF booklet, heart healthy low sodium diet, exercise guidelines, restrictions, risk factors, smoking cessation and CRP2 reviewed. All questions and concerns addressed. Will refer to [facility] for CRP2.   1030-1130 Vaughn Asberry Hacking, RN BSN 07/10/2023 11:31 AM

## 2023-07-10 NOTE — TOC Progression Note (Addendum)
 Transition of Care [facility]) - Progression Note    Patient Details  Name: Amber Franco MRN: [id number] Date of Birth: 02-03-1971  Transition [facility] CM/SW Contact  Justina Delcia Czar, RN Phone Number: [phone] 07/10/2023, 2:23 PM  Clinical Narrative:    HF TOC CM provided pt with scale and pill planner for home. Provided her information for DSS and SSA to check on Medicaid and SS disability. States she will review application online and complete.  States she has her mother's DME at home such as RW, Rollator and bedside commode.  States she recently lost her father and husband at home. She recently moved to [place] from [place]  to assist her Dad.  Discussed having Chaplain come for a visit.  Will continue to follow for dc needs, will have her meds go to [facility] pharmacy at dc.  Pt states she has Living Well with Heart Failure booklet. Discussed daily weights and reviewed low sodium/heart healthy diet.    Expected Discharge Plan: Home/Self Care Barriers to Discharge: Continued Medical Work up  Expected Discharge Plan and Services   Discharge Planning Services: CM Consult   Living arrangements for the past 2 months: Single Family Home                                       Social Determinants of Health (SDOH) Interventions SDOH Screenings   Food Insecurity: No Food Insecurity (07/07/2023)  Housing: Low Risk  (07/07/2023)  Transportation Needs: No Transportation Needs (07/07/2023)  Utilities: Not At Risk (07/07/2023)  Financial Resource Strain: Low Risk  (06/14/2019)   Received from [employer] and [employer], [employer] and [employer]  Physical Activity: Inactive (06/14/2019)   Received from [employer] and [employer], [employer] and [employer]  Social Connections: Unknown (06/14/2019)   Received from [employer] and [employer], [employer], [employer]consulting) [employer]iliates and [employer]  Stress: No Stress Concern Present (06/14/2019)   Received from [employer] and [employer], [employer] Affiliates and [employer]  Tobacco Use: High Risk (07/09/2023)    Readmission Risk Interventions     No data to display

## 2023-07-10 NOTE — Assessment & Plan Note (Addendum)
 BNP 1808 on arrival, and echocardiogram with EF of less than 20% with global LV hypokinesis, LV apex swirling, severely reduced RV function, moderate MVR.  S/p heart cath without ischemic disease, showed reduced cardiac output and elevated L filling pressures. Patient tolerated cath well and remains stable on room air. Cardiac MRI concerning for NICM vs prior myocarditis vs cardiac sarcoid. S/p IV lasix , transitioned to po lasix , down 3.1 L during admission. Telemetry unremarkable overnight. Continued soft BPs overnight. Stable volume exam.  -Cardiology following, appreciate recommendations  -GDMT: spironolactone  12.5, Farxiga  10, losartan  12.5 daily -Continue digoxin  0.125 daily  -Continue 40 lasix  po daily  -Fu ACE level -Needs outpatient cardiac PET -Lidocaine  patch for chest region -Continuous cardiac monitoring  -Strict I's/O's -Daily weights -AM BMP, Mag, CBC - replete electrolytes as indicated

## 2023-07-10 NOTE — Progress Notes (Signed)
 Daily Progress Note Intern Pager: [phone]  Patient name: Amber Franco Medical record number: [id number] Date of birth: 08/13/1970 Age: 53 y.o. Gender: female  Primary Care Provider: Forest Coy, MD Consultants: Cardiology Code Status: FULL    Pt Overview and Major Events to Date:  07/06/23: Admitted to FMTS 07/08/23: Heart cath without ischemic disease, showed reduced cardiac output and elevated L filling pressures.  07/09/23: Cardiac MRI concerning for NICM vs prior myocarditis vs cardiac sarcoid  Assessment and Plan:   Amber Franco is a 53 y.o. female PMH remote hx of HTN (not on meds), chronic back pain presenting with chest pain, orthopnea and dyspnea on exertion after fall onto plastic tab 4 days ago and recent family members passing.  Found to have new acute heart failure with EF <20%. Continues to have chest pain and dyspnea, satting well on room air. Cardiology following.  Heart cath 07/08/23 without ischemic disease, showed reduced cardiac output and elevated L filling pressures. Cardiac MRI concerning for NICM vs prior myocarditis vs cardiac sarcoid. Assessment & Plan New onset of congestive heart failure (HCC) BNP 1808 on arrival, and echocardiogram with EF of less than 20% with global LV hypokinesis, LV apex swirling, severely reduced RV function, moderate MVR.  S/p heart cath without ischemic disease, showed reduced cardiac output and elevated L filling pressures. Patient tolerated cath well and remains stable on room air. Cardiac MRI concerning for NICM vs prior myocarditis vs cardiac sarcoid. S/p IV lasix , transitioned to po lasix , down 3.1 L during admission. Telemetry unremarkable overnight. Continued soft BPs overnight. Stable volume exam.  -Cardiology following, appreciate recommendations  -GDMT: spironolactone  12.5, Farxiga  10, losartan  12.5 daily -Continue digoxin  0.125 daily  -Continue 40 lasix  po daily  -Fu ACE level -Needs outpatient cardiac PET -Lidocaine  patch  for chest region -Continuous cardiac monitoring  -Strict I's/O's -Daily weights -AM BMP, Mag, CBC - replete electrolytes as indicated  LFT elevation ALT mildly elevated 54 >49 >52. Tbili 2.1 > 1.8 >2.1. No alcohol use. Hepatitis panel negative. Most likely related to hepatic congestion.    Chronic and Stable Issues: Sleep disturbances-she is unsure what medication she takes at home, continue melatonin nightly for now History of hypertension-states that she is now off of all medications due to improved lifestyle Back pain-add home flexeril  PRN if needing Reported CVA hx-states that she has previously had a stroke in 2018 however based on documentation from neurology 10/15/2016 there was no objective evidence of acute neurologic deficits or previous strokes and MRI brain negative.    FEN/GI: Reg diet, Gas X, PPI for possible reflux symptoms  PPx: Lovenox  Dispo: Home pending cMRI and clinical stability   Subjective:  Patient feeling better this morning. Chest pain and breathing continue to improve. Able to ambulate throughout the halls. Tolerating PO with regular BM. Continues to have some stomach pain after eating.   Objective: Temp:  [97.5 F (36.4 C)-98.5 F (36.9 C)] 98.2 F (36.8 C) (01/09 0318) Pulse Rate:  [88-100] 88 (01/09 0318) Resp:  [17-20] 20 (01/09 0610) BP: (88-107)/(61-81) 89/71 (01/09 0318) SpO2:  [95 %-100 %] 98 % (01/09 0318) Weight:  [78.1 kg] 78.1 kg (01/09 0610) Physical Exam: General: Well-appearing. Resting comfortably in room. CV: Normal S1/S2. No extra heart sounds. Warm and well-perfused. No JVD noted.  Pulm: Breathing comfortably on room air. CTAB. No increased WOB. Abd: BS present. Soft, non-distended. Tender to palpation of epigastric region. No rebound or guarding.  Skin:  Warm, dry. Psych: Pleasant and  appropriate.   Laboratory: Most recent CBC Lab Results  Component Value Date   WBC 8.6 07/10/2023   HGB 14.0 07/10/2023   HCT 41.6 07/10/2023    MCV 87.2 07/10/2023   PLT 275 07/10/2023   Most recent BMP    Latest Ref Rng & Units 07/10/2023    3:17 AM  BMP  Glucose 70 - 99 mg/dL 99   BUN 6 - 20 mg/dL 12   Creatinine 9.55 - 1.00 mg/dL 9.12   Sodium 864 - 854 mmol/L 137   Potassium 3.5 - 5.1 mmol/L 4.2   Chloride 98 - 111 mmol/L 107   CO2 22 - 32 mmol/L 22   Calcium  8.9 - 10.3 mg/dL 9.1    cMRI 01/29/73: Mild LV dilatation w/ severe sys dys (EF 25%), Normal RV w/ mod sys dys (EF 37%), multifocal LGE possible sarcoidosis vs DCM vs prior myocarditis (rec FDG-PET), moderate MR (22%), small pericardial effusion  Diona Perkins, MD 07/10/2023, 7:15 AM  PGY-1, [facility] FPTS Intern pager: [phone], text pages welcome Secure chat group [facility] Teaching Service

## 2023-07-10 NOTE — Assessment & Plan Note (Signed)
 ALT mildly elevated 54 >49 >52. Tbili 2.1 > 1.8 >2.1. No alcohol use. Hepatitis panel negative. Most likely related to hepatic congestion.

## 2023-07-10 NOTE — Progress Notes (Addendum)
 Advanced Heart Failure Rounding Note  HF Cardiologist: Dr. Zenaida  Chief Complaint: Acute HFrEF  Subjective:    Didn't sleep well. Has a headache. Has been ambulating without dyspnea.  Objective:   Weight Range: 78.1 kg Body mass index is 31.49 kg/m.   Vital Signs:   Temp:  [97.5 F (36.4 C)-98.5 F (36.9 C)] 98.2 F (36.8 C) (01/09 0318) Pulse Rate:  [88-100] 88 (01/09 0318) Resp:  [17-20] 20 (01/09 0610) BP: (88-107)/(61-81) 89/71 (01/09 0318) SpO2:  [95 %-100 %] 98 % (01/09 0318) Weight:  [78.1 kg] 78.1 kg (01/09 0610) Last BM Date : 07/09/23  Weight change: Filed Weights   07/08/23 0537 07/09/23 0506 07/10/23 0610  Weight: 78.7 kg 77 kg 78.1 kg    Intake/Output:   Intake/Output Summary (Last 24 hours) at 07/10/2023 0814 Last data filed at 07/10/2023 0322 Gross per 24 hour  Intake 500 ml  Output 400 ml  Net 100 ml      Physical Exam  General:  Fatigued appearing.  HEENT: normal Neck: supple. no JVD.  Cor: PMI nondisplaced. Regular rate & rhythm. No rubs, gallops or murmurs. Lungs: clear Abdomen: soft, nontender, nondistended.  Extremities: no cyanosis, clubbing, rash, edema Neuro: alert & orientedx3. Affect pleasant   Telemetry  SR 80s-90s  EKG    No new to review  Labs    CBC Recent Labs    07/09/23 0359 07/10/23 0317  WBC 8.7 8.6  HGB 14.4 14.0  HCT 42.1 41.6  MCV 85.7 87.2  PLT 265 275   Basic Metabolic Panel Recent Labs    98/91/74 0359 07/10/23 0317  NA 136 137  K 3.6 4.2  CL 103 107  CO2 23 22  GLUCOSE 116* 99  BUN 13 12  CREATININE 0.83 0.87  CALCIUM  8.9 9.1  MG 1.9 2.1   Liver Function Tests Recent Labs    07/08/23 0344  AST 27  ALT 52*  ALKPHOS 36*  BILITOT 2.1*  PROT 6.3*  ALBUMIN 3.5   No results for input(s): LIPASE, AMYLASE in the last 72 hours.  Cardiac Enzymes No results for input(s): CKTOTAL, CKMB, CKMBINDEX, TROPONINI in the last 72 hours.  BNP: BNP (last 3 results) Recent  Labs    07/06/23 1050  BNP 1,808.6*    ProBNP (last 3 results) No results for input(s): PROBNP in the last 8760 hours.   D-Dimer No results for input(s): DDIMER in the last 72 hours.  Hemoglobin A1C No results for input(s): HGBA1C in the last 72 hours. Fasting Lipid Panel No results for input(s): CHOL, HDL, LDLCALC, TRIG, CHOLHDL, LDLDIRECT in the last 72 hours. Thyroid Function Tests No results for input(s): TSH, T4TOTAL, T3FREE, THYROIDAB in the last 72 hours.  Invalid input(s): FREET3   Other results:   Imaging    MR CARDIAC MORPHOLOGY W WO CONTRAST Result Date: 07/10/2023 CLINICAL DATA:  53F p/w new heart failure. Echo with EF <20%, severe RV dysfunction, moderate MR. Cath with normal coronaries EXAM: CARDIAC MRI TECHNIQUE: The patient was scanned on a 1.5 Tesla Siemens magnet. A dedicated cardiac coil was used. Functional imaging was done using Fiesta sequences. 2,3, and 4 chamber views were done to assess for RWMA's. Modified Simpson's rule using a short axis stack was used to calculate an ejection fraction on a dedicated work [employer]. The patient received 10 cc of Gadavist . After 10 minutes inversion recovery sequences were used to assess for infiltration and scar tissue. Phase contrast velocity mapping  was performed above the aortic and pulmonic valves CONTRAST:  10 cc  of Gadavist  FINDINGS: Left ventricle: -Mild dilatation -Severe systolic dysfunction -Mild ECV elevation (31%) -RV insertion site LGE -Midwall LGE in basal septum and apical lateral wall LV EF:  25% (Normal 52-79%) Absolute volumes: LV EDV: (Normal 78-167 mL) LV ESV: (Normal 21-64 mL) LV SV: 54mL (Normal 52-114 mL) CO: 4.8L/min (Normal 2.7-6.3 L/min) Indexed volumes: LV EDV: 172mL/sq-m (Normal 50-96 mL/sq-m) LV ESV: 77mL/sq-m (Normal 10-40 mL/sq-m) LV SV: 47mL/sq-m (Normal 33-64 mL/sq-m) CI: 2.6L/min/sq-m (Normal 1.9-3.9 L/min/sq-m) Right ventricle:  Normal size with moderate systolic dysfunction RV EF: 37% (Normal 52-80%) Absolute volumes: RV EDV: (Normal 79-175 mL) RV ESV: 90mL (Normal 13-75 mL) RV SV: 52mL (Normal 56-110 mL) CO: 4.6L/min (Normal 2.7-6 L/min) Indexed volumes: RV EDV: 35mL/sq-m (Normal 51-97 mL/sq-m) RV ESV: 25mL/sq-m (Normal 9-42 mL/sq-m) RV SV: 23mL/sq-m (Normal 35-61 mL/sq-m) CI: 2.5L/min/sq-m (Normal 1.8-3.8 L/min/sq-m) Left atrium: Mild enlargement Right atrium: Mild enlargement Mitral valve: Moderate regurgitation (regurgitant fraction 22%) Aortic valve: Trivial regurgitation Tricuspid valve: Mild regurgitation (regurgitant fraction 10%) Pulmonic valve: Trivial regurgitation Aorta: Normal proximal ascending aorta Pericardium: Small effusion IMPRESSION: 1.  Mild LV dilatation with severe systolic dysfunction (EF 25%) 2.  Normal RV size with moderate systolic dysfunction (EF 37%) 3. Midwall LGE in basal septum and apical lateral wall, which is a nonischemic scar pattern. Also with RV insertion site LGE, which is a nonspecific scar pattern often seen in setting of elevated pulmonary pressures. Differential includes dilated cardiomyopathy or prior myocarditis, but given multifocal LGE would also consider sarcoid in the differential and recommend FDG-PET for further evaluation 4.  Moderate mitral regurgitation (regurgitant fraction 22%) 5.  Small pericardial effusion Electronically Signed   By: Lonni Nanas M.D.   On: 07/10/2023 00:09   MR CARDIAC VELOCITY FLOW MAP Result Date: 07/10/2023 CLINICAL DATA:  53F p/w new heart failure. Echo with EF <20%, severe RV dysfunction, moderate MR. Cath with normal coronaries EXAM: CARDIAC MRI TECHNIQUE: The patient was scanned on a 1.5 Tesla Siemens magnet. A dedicated cardiac coil was used. Functional imaging was done using Fiesta sequences. 2,3, and 4 chamber views were done to assess for RWMA's. Modified Simpson's rule using a short axis stack was used to calculate an ejection fraction  on a dedicated work [employer]. The patient received 10 cc of Gadavist . After 10 minutes inversion recovery sequences were used to assess for infiltration and scar tissue. Phase contrast velocity mapping was performed above the aortic and pulmonic valves CONTRAST:  10 cc  of Gadavist  FINDINGS: Left ventricle: -Mild dilatation -Severe systolic dysfunction -Mild ECV elevation (31%) -RV insertion site LGE -Midwall LGE in basal septum and apical lateral wall LV EF:  25% (Normal 52-79%) Absolute volumes: LV EDV: (Normal 78-167 mL) LV ESV: (Normal 21-64 mL) LV SV: 54mL (Normal 52-114 mL) CO: 4.8L/min (Normal 2.7-6.3 L/min) Indexed volumes: LV EDV: 123mL/sq-m (Normal 50-96 mL/sq-m) LV ESV: 23mL/sq-m (Normal 10-40 mL/sq-m) LV SV: 1mL/sq-m (Normal 33-64 mL/sq-m) CI: 2.6L/min/sq-m (Normal 1.9-3.9 L/min/sq-m) Right ventricle: Normal size with moderate systolic dysfunction RV EF: 37% (Normal 52-80%) Absolute volumes: RV EDV: (Normal 79-175 mL) RV ESV: 90mL (Normal 13-75 mL) RV SV: 52mL (Normal 56-110 mL) CO: 4.6L/min (Normal 2.7-6 L/min) Indexed volumes: RV EDV: 26mL/sq-m (Normal 51-97 mL/sq-m) RV ESV: 1mL/sq-m (Normal 9-42 mL/sq-m) RV SV: 38mL/sq-m (Normal 35-61 mL/sq-m) CI: 2.5L/min/sq-m (Normal 1.8-3.8 L/min/sq-m) Left atrium: Mild enlargement Right atrium: Mild enlargement Mitral valve: Moderate regurgitation (regurgitant fraction  22%) Aortic valve: Trivial regurgitation Tricuspid valve: Mild regurgitation (regurgitant fraction 10%) Pulmonic valve: Trivial regurgitation Aorta: Normal proximal ascending aorta Pericardium: Small effusion IMPRESSION: 1.  Mild LV dilatation with severe systolic dysfunction (EF 25%) 2.  Normal RV size with moderate systolic dysfunction (EF 37%) 3. Midwall LGE in basal septum and apical lateral wall, which is a nonischemic scar pattern. Also with RV insertion site LGE, which is a nonspecific scar pattern often seen in setting of elevated pulmonary pressures.  Differential includes dilated cardiomyopathy or prior myocarditis, but given multifocal LGE would also consider sarcoid in the differential and recommend FDG-PET for further evaluation 4.  Moderate mitral regurgitation (regurgitant fraction 22%) 5.  Small pericardial effusion Electronically Signed   By: Lonni Nanas M.D.   On: 07/10/2023 00:09   MR CARDIAC VELOCITY FLOW MAP Result Date: 07/10/2023 CLINICAL DATA:  33F p/w new heart failure. Echo with EF <20%, severe RV dysfunction, moderate MR. Cath with normal coronaries EXAM: CARDIAC MRI TECHNIQUE: The patient was scanned on a 1.5 Tesla Siemens magnet. A dedicated cardiac coil was used. Functional imaging was done using Fiesta sequences. 2,3, and 4 chamber views were done to assess for RWMA's. Modified Simpson's rule using a short axis stack was used to calculate an ejection fraction on a dedicated work [employer]. The patient received 10 cc of Gadavist . After 10 minutes inversion recovery sequences were used to assess for infiltration and scar tissue. Phase contrast velocity mapping was performed above the aortic and pulmonic valves CONTRAST:  10 cc  of Gadavist  FINDINGS: Left ventricle: -Mild dilatation -Severe systolic dysfunction -Mild ECV elevation (31%) -RV insertion site LGE -Midwall LGE in basal septum and apical lateral wall LV EF:  25% (Normal 52-79%) Absolute volumes: LV EDV: (Normal 78-167 mL) LV ESV: (Normal 21-64 mL) LV SV: 54mL (Normal 52-114 mL) CO: 4.8L/min (Normal 2.7-6.3 L/min) Indexed volumes: LV EDV: 151mL/sq-m (Normal 50-96 mL/sq-m) LV ESV: 6mL/sq-m (Normal 10-40 mL/sq-m) LV SV: 57mL/sq-m (Normal 33-64 mL/sq-m) CI: 2.6L/min/sq-m (Normal 1.9-3.9 L/min/sq-m) Right ventricle: Normal size with moderate systolic dysfunction RV EF: 37% (Normal 52-80%) Absolute volumes: RV EDV: (Normal 79-175 mL) RV ESV: 90mL (Normal 13-75 mL) RV SV: 52mL (Normal 56-110 mL) CO: 4.6L/min (Normal 2.7-6 L/min) Indexed  volumes: RV EDV: 71mL/sq-m (Normal 51-97 mL/sq-m) RV ESV: 24mL/sq-m (Normal 9-42 mL/sq-m) RV SV: 18mL/sq-m (Normal 35-61 mL/sq-m) CI: 2.5L/min/sq-m (Normal 1.8-3.8 L/min/sq-m) Left atrium: Mild enlargement Right atrium: Mild enlargement Mitral valve: Moderate regurgitation (regurgitant fraction 22%) Aortic valve: Trivial regurgitation Tricuspid valve: Mild regurgitation (regurgitant fraction 10%) Pulmonic valve: Trivial regurgitation Aorta: Normal proximal ascending aorta Pericardium: Small effusion IMPRESSION: 1.  Mild LV dilatation with severe systolic dysfunction (EF 25%) 2.  Normal RV size with moderate systolic dysfunction (EF 37%) 3. Midwall LGE in basal septum and apical lateral wall, which is a nonischemic scar pattern. Also with RV insertion site LGE, which is a nonspecific scar pattern often seen in setting of elevated pulmonary pressures. Differential includes dilated cardiomyopathy or prior myocarditis, but given multifocal LGE would also consider sarcoid in the differential and recommend FDG-PET for further evaluation 4.  Moderate mitral regurgitation (regurgitant fraction 22%) 5.  Small pericardial effusion Electronically Signed   By: Lonni Nanas M.D.   On: 07/10/2023 00:09     Medications:     Scheduled Medications:  dapagliflozin  propanediol  10 mg Oral Daily   digoxin   0.125 mg Oral Daily   fluticasone   1 spray Each Nare Daily   furosemide   40 mg Oral Daily   lidocaine   1 patch Transdermal Q24H   loratadine   10 mg Oral Daily   losartan   25 mg Oral QHS   melatonin  5 mg Oral QHS   pantoprazole   40 mg Oral Daily   spironolactone   12.5 mg Oral Daily    Infusions:    PRN Medications: acetaminophen , LORazepam     Patient Profile  53 y.o. female with history of HTN (off meds with lifestyle changes), sickle cell trait.  Admitted with acute systolic CHF.  Assessment/Plan  Acute systolic CHF -Presented with acute systolic heart failure after personal trauma  including recent death of her husband and father -Echo: EF < 20%, slow swirling flow at LV apex w/o thrombus, RV severely reduced, moderate MR.  -RHC/LHC: normal cors, elevated SVR, PCWP 25, CI 1.9.  -CMRI: LVEF 25%, RVEF 37%, midwall LGE in basal septum and apical lateral wall, RV insertion LGE. Differential includes NICM vs prior myocarditis. Given multifocal LGE, recommended to rule out cardiac sarcoid. Will need outpatient cardiac PET. Check ACE level. - Volume looks good today - Continue 40 mg lasix  po daily - Continue digoxin  0.125 mg daily - BP soft (some 80s systolic). Decrease Losartan  to 12.5 mg daily. - Continue spiro 12.5 mg daily - Continue farxiga  10 mg daily  - Cardiac rehab - Renal function stable   2. HTN - BP soft today - Meds as above   3. Elevated LFTs Elevated Tbili - Suspect may be d/t hepatic congestion - Stable  Plan for discharge home tomorrow.  Will need medications to [facility] at discharge. Active [place] [employer] verified by TOC.  Length of Stay: 4  Kanon Colunga N, PA-C  07/10/2023, 8:14 AM  Advanced Heart Failure Team Pager [phone] (M-F; 7a - 5p)  Please contact [facility] for night-coverage after hours (5p -7a ) and weekends on [email or web address]

## 2023-07-11 ENCOUNTER — Other Ambulatory Visit (HOSPITAL_COMMUNITY): Payer: Self-pay

## 2023-07-11 DIAGNOSIS — I5021 Acute systolic (congestive) heart failure: Secondary | ICD-10-CM

## 2023-07-11 DIAGNOSIS — I428 Other cardiomyopathies: Secondary | ICD-10-CM | POA: Diagnosis not present

## 2023-07-11 DIAGNOSIS — I509 Heart failure, unspecified: Secondary | ICD-10-CM | POA: Diagnosis not present

## 2023-07-11 LAB — MAGNESIUM: Magnesium: 1.9 mg/dL (ref 1.7–2.4)

## 2023-07-11 LAB — BASIC METABOLIC PANEL
Anion gap: 9 (ref 5–15)
BUN: 12 mg/dL (ref 6–20)
CO2: 20 mmol/L — ABNORMAL LOW (ref 22–32)
Calcium: 9.1 mg/dL (ref 8.9–10.3)
Chloride: 106 mmol/L (ref 98–111)
Creatinine, Ser: 0.81 mg/dL (ref 0.44–1.00)
GFR, Estimated: >60 mL/min (ref >60)
Glucose, Bld: 102 mg/dL — ABNORMAL HIGH (ref 70–99)
Potassium: 4.1 mmol/L (ref 3.5–5.1)
Sodium: 135 mmol/L (ref 135–145)

## 2023-07-11 LAB — ANGIOTENSIN CONVERTING ENZYME: Angiotensin-Converting Enzyme: 36 U/L (ref 14–82)

## 2023-07-11 MED ORDER — SPIRONOLACTONE 25 MG PO TABS
12.5000 mg | ORAL_TABLET | Freq: Every day | ORAL | 0 refills | Status: DC
  Filled 2023-07-11: qty 30, 60d supply, fill #0

## 2023-07-11 MED ORDER — DAPAGLIFLOZIN PROPANEDIOL 10 MG PO TABS
10.0000 mg | ORAL_TABLET | Freq: Every day | ORAL | 0 refills | Status: DC
  Filled 2023-07-11: qty 30, 30d supply, fill #0

## 2023-07-11 MED ORDER — LIDOCAINE 5 % EX PTCH: 1.0000 | MEDICATED_PATCH | CUTANEOUS | Status: DC

## 2023-07-11 MED ORDER — PANTOPRAZOLE SODIUM 40 MG PO TBEC
40.0000 mg | DELAYED_RELEASE_TABLET | Freq: Every day | ORAL | 0 refills | Status: DC
  Filled 2023-07-11: qty 30, 30d supply, fill #0

## 2023-07-11 MED ORDER — LOSARTAN POTASSIUM 25 MG PO TABS
12.5000 mg | ORAL_TABLET | Freq: Every day | ORAL | 0 refills | Status: DC
  Filled 2023-07-11: qty 30, 60d supply, fill #0

## 2023-07-11 MED ORDER — ACETAMINOPHEN 325 MG PO TABS: 650.0000 mg | ORAL_TABLET | Freq: Four times a day (QID) | ORAL | Status: AC | PRN

## 2023-07-11 MED ORDER — DIGOXIN 125 MCG PO TABS
0.1250 mg | ORAL_TABLET | Freq: Every day | ORAL | 0 refills | Status: DC
  Filled 2023-07-11: qty 30, 30d supply, fill #0

## 2023-07-11 MED ORDER — FLUTICASONE PROPIONATE 50 MCG/ACT NA SUSP
1.0000 | Freq: Every day | NASAL | 0 refills | Status: DC
  Filled 2023-07-11: qty 16, 30d supply, fill #0

## 2023-07-11 MED ORDER — LOSARTAN POTASSIUM 25 MG PO TABS
12.5000 mg | ORAL_TABLET | Freq: Every day | ORAL | Status: DC
  Administered 2023-07-11 – 2023-07-12 (×2): 12.5 mg via ORAL
  Filled 2023-07-11 (×2): qty 1

## 2023-07-11 MED ORDER — MAGNESIUM SULFATE IN D5W 1-5 GM/100ML-% IV SOLN
1.0000 g | Freq: Once | INTRAVENOUS | Status: AC
  Administered 2023-07-11: 1 g via INTRAVENOUS
  Filled 2023-07-11: qty 100

## 2023-07-11 MED ORDER — MELATONIN 5 MG PO TABS
5.0000 mg | ORAL_TABLET | Freq: Every day | ORAL | 0 refills | Status: DC
  Filled 2023-07-11: qty 30, 30d supply, fill #0

## 2023-07-11 MED ORDER — FUROSEMIDE 40 MG PO TABS
40.0000 mg | ORAL_TABLET | Freq: Every day | ORAL | 11 refills | Status: DC | PRN
  Filled 2023-07-11 – 2023-08-04 (×2): qty 30, 30d supply, fill #0

## 2023-07-11 MED ORDER — LORATADINE 10 MG PO TABS: 10.0000 mg | ORAL_TABLET | Freq: Every day | ORAL | Status: AC

## 2023-07-11 NOTE — Assessment & Plan Note (Signed)
 ALT mildly elevated 54 >49 >52. Tbili 2.1 > 1.8 >2.1. No alcohol use. Hepatitis panel negative. Most likely related to hepatic congestion.

## 2023-07-11 NOTE — Progress Notes (Signed)
 Daily Progress Note Intern Pager: [phone]  Patient name: Amber Franco Medical record number: [id number] Date of birth: 01/07/1971 Age: 53 y.o. Gender: female  Primary Care Provider: Forest Coy, MD Consultants: Cardiology Code Status: FULL    Pt Overview and Major Events to Date:  07/06/23: Admitted to FMTS 07/08/23: Heart cath without ischemic disease, showed reduced cardiac output and elevated L filling pressures.  07/09/23: Cardiac MRI concerning for NICM vs prior myocarditis vs cardiac sarcoid   Assessment and Plan:   Amber Franco is a 53 y.o. female PMH remote hx of HTN (not on meds), chronic back pain presenting with chest pain, orthopnea and dyspnea on exertion after fall onto plastic tab 4 days ago and recent family members passing.  Found to have new acute heart failure with EF <20%. Continues to have chest pain and dyspnea, satting well on room air. Cardiology following.  Heart cath 07/08/23 without ischemic disease, showed reduced cardiac output and elevated L filling pressures. Cardiac MRI concerning for NICM vs prior myocarditis vs cardiac sarcoid. Patient started on GDMT per cardiology with symptomatic improvement.  Assessment & Plan Acute systolic heart failure (HCC) BNP 1808 on arrival, and echocardiogram with EF of less than 20% with global LV hypokinesis, LV apex swirling, severely reduced RV function, moderate MVR.  S/p heart cath without ischemic disease, showed reduced cardiac output and elevated L filling pressures. Patient tolerated cath well and remains stable on room air. Cardiac MRI concerning for NICM vs prior myocarditis vs cardiac sarcoid. ACE level unremarkable. S/p IV lasix , transitioned to po lasix , down net 2.2 L during admission. Telemetry unremarkable overnight. Some improvement in soft BPs overnight. Stable volume exam.  -Cardiology following, appreciate recommendations  -GDMT: spironolactone  12.5, Farxiga  10, losartan  12.5 daily -Continue digoxin  0.125  daily  -Holding 40 lasix  po - add as needed for 3lb weight gain -Needs outpatient cardiac PET -Lidocaine  patch for chest region -Continuous cardiac monitoring  -Strict I's/O's -Daily weights -AM BMP, Mag - replete electrolytes as indicated  LFT elevation ALT mildly elevated 54 >49 >52. Tbili 2.1 > 1.8 >2.1. No alcohol use. Hepatitis panel negative. Most likely related to hepatic congestion.    Chronic and Stable Issues: Sleep disturbances- melatonin nightly  Back pain-add home flexeril  PRN if needing Reported CVA hx-states that she has previously had a stroke in 2018 however based on documentation from neurology 10/15/2016 there was no objective evidence of acute neurologic deficits or previous strokes and MRI brain negative.    FEN/GI: Reg diet, Gas X, PPI for possible reflux symptoms  PPx: Lovenox  Dispo: Home pending clinical stability .   Subjective:  Patient feeling better this morning. Has been able to ambulate throughout the halls without issue. Her breathing and chest pain continue to improve. Tolerating PO, though describes continued postprandial stomach discomfort.   Objective: Temp:  [97.2 F (36.2 C)-98 F (36.7 C)] 98 F (36.7 C) (01/10 0420) Pulse Rate:  [87-102] 95 (01/10 0420) Resp:  [15-20] 15 (01/10 0420) BP: (81-110)/(53-76) 92/74 (01/10 0420) SpO2:  [92 %-98 %] 98 % (01/10 0420) Weight:  [77.8 kg] 77.8 kg (01/10 0444) Physical Exam: General: Well-appearing. Ambulating independently without resp distress.  CV: Normal S1/S2. No extra heart sounds. Warm and well-perfused.  Pulm: Breathing comfortably on room air. CTAB. No increased WOB. Abd: Soft, non-distended. Tender to palpation of epigastric region. No rebound or guarding.  Ext: No LE edema, erythema, or tenderness.  Skin:  Warm, dry. Psych: Pleasant and appropriate.   Laboratory:  Most recent CBC Lab Results  Component Value Date   WBC 8.6 07/10/2023   HGB 14.0 07/10/2023   HCT 41.6 07/10/2023    MCV 87.2 07/10/2023   PLT 275 07/10/2023   Most recent BMP    Latest Ref Rng & Units 07/11/2023    3:14 AM  BMP  Glucose 70 - 99 mg/dL 897   BUN 6 - 20 mg/dL 12   Creatinine 9.55 - 1.00 mg/dL 9.18   Sodium 864 - 854 mmol/L 135   Potassium 3.5 - 5.1 mmol/L 4.1   Chloride 98 - 111 mmol/L 106   CO2 22 - 32 mmol/L 20   Calcium  8.9 - 10.3 mg/dL 9.1    Diona Perkins, MD 07/11/2023, 7:12 AM  PGY-1, [facility] FPTS Intern pager: [phone], text pages welcome Secure chat group [facility] Teaching Service

## 2023-07-11 NOTE — Assessment & Plan Note (Addendum)
 BNP 1808 on arrival, and echocardiogram with EF of less than 20% with global LV hypokinesis, LV apex swirling, severely reduced RV function, moderate MVR.  S/p heart cath without ischemic disease, showed reduced cardiac output and elevated L filling pressures. Patient tolerated cath well and remains stable on room air. Cardiac MRI concerning for NICM vs prior myocarditis vs cardiac sarcoid. ACE level unremarkable. S/p IV lasix , transitioned to po lasix , down net 2.2 L during admission. Telemetry unremarkable overnight. Some improvement in soft BPs overnight. Stable volume exam.  -Cardiology following, appreciate recommendations  -GDMT: spironolactone  12.5, Farxiga  10, losartan  12.5 daily -Continue digoxin  0.125 daily  -Holding 40 lasix  po - add as needed for 3lb weight gain -Needs outpatient cardiac PET -Lidocaine  patch for chest region -Continuous cardiac monitoring  -Strict I's/O's -Daily weights -AM BMP, Mag - replete electrolytes as indicated

## 2023-07-11 NOTE — Progress Notes (Signed)
 Advanced Heart Failure Rounding Note  HF Cardiologist: Dr. Zenaida  Chief Complaint: Acute HFrEF/headache  Subjective:     Stressed out about living situation. Family unable to arrive until Sunday. Complaining of headache.  Objective:   Weight Range: 77.8 kg Body mass index is 31.37 kg/m.   Vital Signs:   Temp:  [97.2 F (36.2 C)-98 F (36.7 C)] 98 F (36.7 C) (01/10 0420) Pulse Rate:  [87-102] 95 (01/10 0420) Resp:  [15-20] 15 (01/10 0420) BP: (81-110)/(53-76) 92/74 (01/10 0420) SpO2:  [92 %-98 %] 98 % (01/10 0420) Weight:  [77.8 kg] 77.8 kg (01/10 0444) Last BM Date : 07/10/23  Weight change: Filed Weights   07/09/23 0506 07/10/23 0610 07/11/23 0444  Weight: 77 kg 78.1 kg 77.8 kg    Intake/Output:   Intake/Output Summary (Last 24 hours) at 07/11/2023 0817 Last data filed at 07/11/2023 0600 Gross per 24 hour  Intake 820 ml  Output --  Net 820 ml      Physical Exam  General:  Well appearing. No resp difficulty HEENT: normal Neck: supple. no JVD. Carotids 2+ bilat; no bruits. No lymphadenopathy or thryomegaly appreciated. Cor: PMI nondisplaced. Regular rate & rhythm. No rubs, gallops or murmurs. Lungs: clear Abdomen: soft, nontender, nondistended. No hepatosplenomegaly. No bruits or masses. Good bowel sounds. Extremities: no cyanosis, clubbing, rash, edema Neuro: alert & orientedx3, cranial nerves grossly intact. moves all 4 extremities w/o difficulty. Affect pleasant   Telemetry  SR 80-90s   EKG    No new to review  Labs    CBC Recent Labs    07/09/23 0359 07/10/23 0317  WBC 8.7 8.6  HGB 14.4 14.0  HCT 42.1 41.6  MCV 85.7 87.2  PLT 265 275   Basic Metabolic Panel Recent Labs    98/90/74 0317 07/11/23 0314  NA 137 135  K 4.2 4.1  CL 107 106  CO2 22 20*  GLUCOSE 99 102*  BUN 12 12  CREATININE 0.87 0.81  CALCIUM  9.1 9.1  MG 2.1 1.9   Liver Function Tests No results for input(s): AST, ALT, ALKPHOS, BILITOT, PROT,  ALBUMIN in the last 72 hours.  No results for input(s): LIPASE, AMYLASE in the last 72 hours.  Cardiac Enzymes No results for input(s): CKTOTAL, CKMB, CKMBINDEX, TROPONINI in the last 72 hours.  BNP: BNP (last 3 results) Recent Labs    07/06/23 1050  BNP 1,808.6*    ProBNP (last 3 results) No results for input(s): PROBNP in the last 8760 hours.   D-Dimer No results for input(s): DDIMER in the last 72 hours.  Hemoglobin A1C No results for input(s): HGBA1C in the last 72 hours. Fasting Lipid Panel No results for input(s): CHOL, HDL, LDLCALC, TRIG, CHOLHDL, LDLDIRECT in the last 72 hours. Thyroid Function Tests No results for input(s): TSH, T4TOTAL, T3FREE, THYROIDAB in the last 72 hours.  Invalid input(s): FREET3   Other results:   Imaging    No results found.    Medications:     Scheduled Medications:  dapagliflozin  propanediol  10 mg Oral Daily   digoxin   0.125 mg Oral Daily   fluticasone   1 spray Each Nare Daily   furosemide   40 mg Oral Daily   lidocaine   1 patch Transdermal Q24H   loratadine   10 mg Oral Daily   losartan   25 mg Oral QHS   melatonin  5 mg Oral QHS   pantoprazole   40 mg Oral Daily   spironolactone   12.5 mg Oral Daily  Infusions:    PRN Medications: acetaminophen , LORazepam     Patient Profile  53 y.o. female with history of HTN (off meds with lifestyle changes), sickle cell trait.  Admitted with acute systolic CHF.  Assessment/Plan  Acute systolic CHF -Presented with acute systolic heart failure after personal trauma including recent death of her husband and father -Echo: EF < 20%, slow swirling flow at LV apex w/o thrombus, RV severely reduced, moderate MR.  -RHC/LHC: normal cors, elevated SVR, PCWP 25, CI 1.9.  -CMRI: LVEF 25%, RVEF 37%, midwall LGE in basal septum and apical lateral wall, RV insertion LGE. Differential includes NICM vs prior myocarditis. Given multifocal LGE,  recommended to rule out cardiac sarcoid. Will need outpatient cardiac PET. Check ACE level. - GDMT limited by hypotension.  - Appears euvolemic. Hold lasix . She will need lasix  as needed for 3 pound weight gain.  -  Continue digoxin  0.125 mg daily - No bb for now.  - Cut back Losartan  to 12.5 mg daily at bedtime.  Has had low pressures.   - Continue spiro 12.5 mg daily - Continue farxiga  10 mg daily  - Renal function stable   2. HTN -BP soft.    3. Elevated LFTs Elevated Tbili - Suspect may be d/t hepatic congestion - Stable  HF follow up 07/16/22.    Length of Stay: 5  Shaden Higley, NP  07/11/2023, 8:17 AM  Advanced Heart Failure Team Pager [phone] (M-F; 7a - 5p)  Please contact [facility] for night-coverage after hours (5p -7a ) and weekends on [email or web address]

## 2023-07-12 DIAGNOSIS — I5021 Acute systolic (congestive) heart failure: Secondary | ICD-10-CM | POA: Diagnosis not present

## 2023-07-12 DIAGNOSIS — I428 Other cardiomyopathies: Secondary | ICD-10-CM | POA: Diagnosis not present

## 2023-07-12 LAB — BASIC METABOLIC PANEL
Anion gap: 9 (ref 5–15)
BUN: 8 mg/dL (ref 6–20)
CO2: 21 mmol/L — ABNORMAL LOW (ref 22–32)
Calcium: 9.3 mg/dL (ref 8.9–10.3)
Chloride: 105 mmol/L (ref 98–111)
Creatinine, Ser: 0.81 mg/dL (ref 0.44–1.00)
GFR, Estimated: >60 mL/min (ref >60)
Glucose, Bld: 100 mg/dL — ABNORMAL HIGH (ref 70–99)
Potassium: 3.6 mmol/L (ref 3.5–5.1)
Sodium: 135 mmol/L (ref 135–145)

## 2023-07-12 LAB — MAGNESIUM: Magnesium: 2 mg/dL (ref 1.7–2.4)

## 2023-07-12 MED ORDER — POTASSIUM CHLORIDE CRYS ER 20 MEQ PO TBCR
40.0000 meq | EXTENDED_RELEASE_TABLET | Freq: Once | ORAL | Status: AC
  Administered 2023-07-12: 40 meq via ORAL
  Filled 2023-07-12: qty 2

## 2023-07-12 MED ORDER — LIDOCAINE 5 % EX PTCH: 1.0000 | MEDICATED_PATCH | Freq: Every day | CUTANEOUS | Status: DC | PRN

## 2023-07-12 MED ORDER — POTASSIUM CHLORIDE CRYS ER 20 MEQ PO TBCR: 40.0000 meq | EXTENDED_RELEASE_TABLET | Freq: Two times a day (BID) | ORAL | Status: DC

## 2023-07-12 NOTE — Assessment & Plan Note (Addendum)
 BNP 1808 on arrival, and echocardiogram with EF of less than 20% with global LV hypokinesis, LV apex swirling, severely reduced RV function, moderate MVR.  S/p heart cath without ischemic disease, showed reduced cardiac output and elevated L filling pressures. Patient tolerated cath well and remains stable on room air. Cardiac MRI concerning for NICM vs prior myocarditis vs cardiac sarcoid. ACE level unremarkable. S/p IV lasix , transitioned to po lasix , down net 1.9 L during admission. Telemetry unremarkable overnight. Soft BP overnight, improved this morning. Stable volume exam.  -Cardiology following, appreciate recommendations  -GDMT: spironolactone  12.5, Farxiga  10, losartan  12.5 daily -Continue digoxin  0.125 daily  -As needed lasix  40 PO for 3lb weight gain  -Recommended 2L fluid intake per day restriction  -Needs outpatient cardiac PET -Lidocaine  patch for chest region -Continuous cardiac monitoring  -Strict I's/O's -Daily weights -AM BMP, Mag - replete electrolytes as indicated  -Involve SW/CW regarding transportation

## 2023-07-12 NOTE — Progress Notes (Signed)
 Daily Progress Note Intern Pager: [phone]  Patient name: Amber Franco Medical record number: [id number] Date of birth: 01/16/1971 Age: 53 y.o. Gender: female  Primary Care Provider: To be Damien Cassis with [facility] Consultants: Cardiology Code Status: FULL    Pt Overview and Major Events to Date:  07/06/23: Admitted to FMTS 07/08/23: Heart cath without ischemic disease, showed reduced cardiac output and elevated L filling pressures.  07/09/23: Cardiac MRI concerning for NICM vs prior myocarditis vs cardiac sarcoid   Assessment and Plan:   Amber Franco is a 53 y.o. female PMH remote hx of HTN (not on meds), chronic back pain presenting with chest pain, orthopnea and dyspnea on exertion after fall onto plastic tab 4 days ago and recent family members passing.  Found to have new acute heart failure with EF <20%. Continues to have chest pain and dyspnea, satting well on room air. Cardiology following.  Heart cath 07/08/23 without ischemic disease, showed reduced cardiac output and elevated L filling pressures. Cardiac MRI concerning for NICM vs prior myocarditis vs cardiac sarcoid. Patient started on GDMT per cardiology with symptomatic improvement.  Assessment & Plan Acute systolic heart failure (HCC) BNP 1808 on arrival, and echocardiogram with EF of less than 20% with global LV hypokinesis, LV apex swirling, severely reduced RV function, moderate MVR.  S/p heart cath without ischemic disease, showed reduced cardiac output and elevated L filling pressures. Patient tolerated cath well and remains stable on room air. Cardiac MRI concerning for NICM vs prior myocarditis vs cardiac sarcoid. ACE level unremarkable. S/p IV lasix , transitioned to po lasix , down net 1.9 L during admission. Telemetry unremarkable overnight. Soft BP overnight, improved this morning. Stable volume exam.  -Cardiology following, appreciate recommendations  -GDMT: spironolactone  12.5, Farxiga  10, losartan  12.5 daily -Continue digoxin   0.125 daily  -As needed lasix  40 PO for 3lb weight gain  -Recommended 2L fluid intake per day restriction  -Needs outpatient cardiac PET -Lidocaine  patch for chest region -Continuous cardiac monitoring  -Strict I's/O's -Daily weights -AM BMP, Mag - replete electrolytes as indicated  -Involve SW/CW regarding transportation  LFT elevation ALT mildly elevated 54 >49 >52. Tbili 2.1 > 1.8 >2.1. No alcohol use. Hepatitis panel negative. Most likely related to hepatic congestion.    Chronic and Stable Issues: Sleep disturbances- melatonin nightly  Back pain- lidocaine  patch, add home flexeril  PRN if needing Allergies- Flonase , Claritin   Reported CVA hx-states that she has previously had a stroke in 2018 however based on documentation from neurology 10/15/2016 there was no objective evidence of acute neurologic deficits or previous strokes and MRI brain negative.    FEN/GI: Reg diet, Gas X, PPI for possible reflux symptoms  PPx: Lovenox  Dispo: [facility], pending cardiology to see patient today  Subjective:  Patient feeling well this morning. Improving chest pain and breathing overall. Had one episode of feeling short of breath yesterday when having stressful/traumatic conversation, this episode self-resolved in seconds. No chest pain this morning, ambulating several times throughout hallways without issue. Tolerating PO well, still has some stomach discomfort after eating and feeling bloated.   Objective: Temp:  [97.6 F (36.4 C)-98.4 F (36.9 C)] 98.4 F (36.9 C) (01/11 0338) Pulse Rate:  [91-101] 98 (01/11 0338) Resp:  [17-20] 19 (01/11 0503) BP: (84-110)/(63-80) 84/72 (01/11 0338) SpO2:  [94 %-97 %] 96 % (01/11 0338) Weight:  [77.7 kg] 77.7 kg (01/11 0337) Physical Exam: General: Well-appearing. Ambulating independently without resp distress.  CV: Normal S1/S2. No extra heart sounds. Warm  and well-perfused.  Pulm: Breathing comfortably on room air. CTAB. No increased WOB. Abd:  Soft. Non-distended. Mild tenderness of RUQ/epigastric region. No rebound or guarding.  Ext: No LE edema, erythema, or tenderness. Tender to palpation of L lower back soft tissue.  Skin:  Warm, dry. Psych: Pleasant and appropriate.   Laboratory: Most recent CBC Lab Results  Component Value Date   WBC 8.6 07/10/2023   HGB 14.0 07/10/2023   HCT 41.6 07/10/2023   MCV 87.2 07/10/2023   PLT 275 07/10/2023   Most recent BMP    Latest Ref Rng & Units 07/12/2023    3:02 AM  BMP  Glucose 70 - 99 mg/dL 899   BUN 6 - 20 mg/dL 8   Creatinine 9.55 - 8.99 mg/dL 9.18   Sodium 864 - 854 mmol/L 135   Potassium 3.5 - 5.1 mmol/L 3.6   Chloride 98 - 111 mmol/L 105   CO2 22 - 32 mmol/L 21   Calcium  8.9 - 10.3 mg/dL 9.3     Amber Perkins, MD 07/12/2023, 8:04 AM  PGY-1, [place] [facility] FPTS Intern pager: [phone], text pages welcome Secure chat group [facility] Teaching Service

## 2023-07-12 NOTE — Plan of Care (Signed)
   Problem: Education: Goal: Knowledge of General Education information will improve Description: Including pain rating scale, medication(s)/side effects and non-pharmacologic comfort measures Outcome: Progressing   Problem: Clinical Measurements: Goal: Ability to maintain clinical measurements within normal limits will improve Outcome: Progressing Goal: Will remain free from infection Outcome: Progressing

## 2023-07-12 NOTE — Progress Notes (Signed)
   Patient Name: Amber Franco Date of Encounter: 07/12/2023 [facility] Cardiologist: None   Interval Summary  .    No acute overnight events. Feeling relatively well. Ambulating the halls without dyspnea. Some mild shortness of breath at times when laying flat in bed. No new or acute complaints.   Vital Signs .    Vitals:   07/12/23 0338 07/12/23 0503 07/12/23 0800 07/12/23 1300  BP: (!) 84/72  107/80 98/66  Pulse: 98  85 92  Resp: 20 19 20 19   Temp: 98.4 F (36.9 C)  (!) 97.5 F (36.4 C)   TempSrc: Oral  Oral   SpO2: 96%  100% 95%  Weight:      Height:        Intake/Output Summary (Last 24 hours) at 07/12/2023 1316 Last data filed at 07/11/2023 2200 Gross per 24 hour  Intake 250 ml  Output --  Net 250 ml      07/12/2023    3:37 AM 07/11/2023    4:44 AM 07/10/2023    6:10 AM  Last 3 Weights  Weight (lbs) 171 lb 4.8 oz 171 lb 8.3 oz 172 lb 2.9 oz  Weight (kg) 77.7 kg 77.8 kg 78.1 kg      Telemetry/ECG    Sinus rhythm - Personally Reviewed  Physical Exam .   GEN: No acute distress.   Neck: JVD low neck Cardiac: Normal rate, regular rhythm Respiratory: Clear to auscultation bilaterally. GI: Soft, nontender, non-distended  MS: No edema  Assessment & Plan .   Mrs. Amber Franco is a 53 y.o. female with history of HTN (off meds with lifestyle changes), sickle cell trait who was admitted with acute systolic heart failure.  Workup thus far consistent with nonischemic etiology based on cardiac MRI.  She had some nonspecific patchy LGE in the basal septum and apical lateral wall.  Most likely etiology is either cardiac sarcoidosis or Takotsubo cardiomyopathy in the setting of having recent personal trauma including the death of her husband and father.  She has diuresed well with symptomatic improvement, now near euvolemic. GDMT has been initiated.  #. Acute systolic CHF  #. NICM - Continue spironolactone  12.5mg  daily - Continue losartan  12.5mg  daily - Continue  dapagliflozin  10mg  daily - PO Lasix  40mg  prn - Outpatient PET to evaluate for cardiac sarcoidosis - Hold on BB until better compensated, will initiate as outpatient - Continue digoxin  0.125mg  daily  For questions or updates, please contact [facility] Please consult [email or web address] for contact info under   Signed, Fonda Kitty, MD

## 2023-07-12 NOTE — Assessment & Plan Note (Signed)
 ALT mildly elevated 54 >49 >52. Tbili 2.1 > 1.8 >2.1. No alcohol use. Hepatitis panel negative. Most likely related to hepatic congestion.

## 2023-07-13 DIAGNOSIS — I428 Other cardiomyopathies: Secondary | ICD-10-CM

## 2023-07-13 DIAGNOSIS — I5021 Acute systolic (congestive) heart failure: Secondary | ICD-10-CM | POA: Diagnosis not present

## 2023-07-13 NOTE — Progress Notes (Signed)
 Pt requested tylenol for pain on her abdomen for the pain score of 10.   Lawson Radar, RN

## 2023-07-13 NOTE — Assessment & Plan Note (Signed)
 Symptomatically improved, euvolemic on exam today.  Tolerating GDMT.  Has outpatient cardiology follow-up.  Plan for discharge today. -GDMT: spironolactone  12.5, Farxiga  10, losartan  12.5 daily -Continue digoxin  0.125 daily  -As needed lasix  40 PO for 3lb weight gain  -Recommended 2L fluid intake per day restriction  -Needs outpatient cardiac PET -AM BMP, Mag - replete electrolytes as indicated  -Involve SW/CW regarding transportation

## 2023-07-13 NOTE — Progress Notes (Signed)
D/c tele and IV. Went over AVS with pt and all questions were addressed.   Lavenia Atlas, RN

## 2023-07-13 NOTE — Assessment & Plan Note (Signed)
 Recommend repeat testing outpatient.

## 2023-07-13 NOTE — Progress Notes (Signed)
     Daily Progress Note Intern Pager: [phone]  Patient name: Amber Franco Medical record number: [id number] Date of birth: [birth year] Age: 53 y.o. Gender: female  Primary Care Provider: Forest Coy, MD Consultants: Cardiology Code Status: FULL CODE  Pt Overview and Major Events to Date:  07/06/23: Admitted to FMTS 07/08/23: Heart cath without ischemic disease, showed reduced cardiac output and elevated L filling pressures.  07/09/23: Cardiac MRI concerning for NICM vs prior myocarditis vs cardiac sarcoid  Assessment and Plan:  53 year old female presenting with orthopnea and dyspnea on exertion after fall.  Found to have new acute heart failure with EF less than 20%.  Cardiac MRI concerning for NICM versus prior myocarditis versus cardiac sarcoid.  Patient started on GDMT with symptomatic improvement.  Medically stable for discharge. Assessment & Plan Acute systolic heart failure (HCC) Symptomatically improved, euvolemic on exam today.  Tolerating GDMT.  Has outpatient cardiology follow-up.  Plan for discharge today. -GDMT: spironolactone  12.5, Farxiga  10, losartan  12.5 daily -Continue digoxin  0.125 daily  -As needed lasix  40 PO for 3lb weight gain  -Recommended 2L fluid intake per day restriction  -Needs outpatient cardiac PET -AM BMP, Mag - replete electrolytes as indicated  -Involve SW/CW regarding transportation  LFT elevation Recommend repeat testing outpatient.   FEN/GI: Regular diet PPx: Lovenox  Dispo:Home  Subjective:  Patient has no acute concerns, waiting for ride to discharge.  Objective: Temp:  [97.5 F (36.4 C)-98.7 F (37.1 C)] 97.5 F (36.4 C) (01/12 0818) Pulse Rate:  [92-104] 100 (01/12 0818) Resp:  [16-20] 20 (01/12 0818) BP: (92-110)/(58-77) 104/77 (01/12 0818) SpO2:  [94 %-99 %] 99 % (01/12 0818) Weight:  [77.5 kg] 77.5 kg (01/12 9367) Physical Exam: General: NAD, eating breakfast Cardiovascular: RRR, no murmurs Respiratory: CTAB, normal work  of breathing on room air Abdomen: Soft, nontender, nondistended Extremities: Moving all 4 extremities  Laboratory: Most recent CBC Lab Results  Component Value Date   WBC 8.6 07/10/2023   HGB 14.0 07/10/2023   HCT 41.6 07/10/2023   MCV 87.2 07/10/2023   PLT 275 07/10/2023   Most recent BMP    Latest Ref Rng & Units 07/12/2023    3:02 AM  BMP  Glucose 70 - 99 mg/dL 899   BUN 6 - 20 mg/dL 8   Creatinine 9.55 - 8.99 mg/dL 9.18   Sodium 864 - 854 mmol/L 135   Potassium 3.5 - 5.1 mmol/L 3.6   Chloride 98 - 111 mmol/L 105   CO2 22 - 32 mmol/L 21   Calcium  8.9 - 10.3 mg/dL 9.3    Howell Lunger, DO 07/13/2023, 9:10 AM  PGY-2, [place] [facility] FPTS Intern pager: [phone], text pages welcome Secure chat group [facility] Teaching Service

## 2023-07-13 NOTE — Discharge Summary (Signed)
 Family Medicine Teaching [facility] Discharge Summary  Patient name: Amber Franco Medical record number: [id number] Date of birth: October 29, 1970 Age: 53 y.o. Gender: female Date of Admission: 07/06/2023  Date of Discharge: 07/13/2023 Admitting Physician: Layman LITTIE Bee, MD  Primary Care Provider: Forest Coy, MD Consultants: Cardiology  Indication for Hospitalization: Acute heart failure  Brief Hospital Course:  Amber Franco is a 53 y.o.female with a history of HTN (not on meds), chronic back pain who was admitted to the [facility] Teaching Service at [facility] for chest pain, orthopnea and dyspnea on exertion after fall. Her hospital course is detailed below:  New onset systolic heart failure  Symptomatic with echocardiogram with EF of less than 20% with global LV hypokinesis, LV apex swirling and severely reduced RV function as well as moderate MVR. Likely takotsubo in setting of death of husband and father. Cardiology consulted and recommended IV diuresis, GDMT, L/RHC (no ischemic disease), cardiac MR (Mild LV dilatation w/ severe systolic dysfunction (EF 25%), Normal RV w/ moderate systolic dysfunction (EF 37%), multifocal LGE possible sarcoidosis vs DCM vs prior myocarditis (recommended FDG-PET), moderate MR (22%), small pericardial effusion ). She was stable and started on spironolactone , farxiga , losartan , digoxin  and transitioned to oral lasix  with plans to hold bblocker until better compensated. Patient discharged with lasix  40 PO daily as needed for >3 lb weight gain.   LFT elevation ALT and Tbili only mildly elevated with negative hepatitis panel. Likely related to hepatic congestion in setting of new heart failure  Other chronic conditions were medically managed with home medications and formulary alternatives as necessary  PCP Follow-up Recommendations: BMP and mag to monitor electrolytes Ensure out pt f/u with cardiology and cardiac PET Ensure understanding of new  medications Might benefit from urea breath test - stop PPI 2 weeks prior to this  Disposition: Home  Discharge Condition: Stable  Discharge Exam: Per my progress note Physical Exam: General: NAD, eating breakfast Cardiovascular: RRR, no murmurs Respiratory: CTAB, normal work of breathing on room air Abdomen: Soft, nontender, nondistended Extremities: Moving all 4 extremities  Significant Procedures: Right and left heart cath  Significant Labs and Imaging:  No results for input(s): WBC, HGB, HCT, PLT in the last 48 hours. Recent Labs  Lab 07/12/23 0302  NA 135  K 3.6  CL 105  CO2 21*  GLUCOSE 100*  BUN 8  CREATININE 0.81  CALCIUM  9.3  MG 2.0   Discharge Medications:  Allergies as of 07/13/2023       Reactions   Strawberry (diagnostic) Anaphylaxis   Tape Other (See Comments)   Leaves marks on skin        Medication List     STOP taking these medications    baclofen  10 MG tablet Commonly known as: LIORESAL    nortriptyline 10 MG capsule Commonly known as: PAMELOR   traZODone 50 MG tablet Commonly known as: DESYREL       TAKE these medications    acetaminophen  325 MG tablet Commonly known as: TYLENOL  Take 2 tablets (650 mg total) by mouth every 6 (six) hours as needed for mild pain (pain score 1-3) or moderate pain (pain score 4-6).   digoxin  0.125 MG tablet Commonly known as: LANOXIN  Take 1 tablet (0.125 mg total) by mouth daily.   Farxiga  10 MG Tabs tablet Generic drug: dapagliflozin  propanediol Take 1 tablet (10 mg total) by mouth daily.   fluticasone  50 MCG/ACT nasal spray Commonly known as: FLONASE  Place 1 spray into both nostrils daily.  furosemide  40 MG tablet Commonly known as: Lasix  Take 1 tablet (40 mg total) by mouth daily as needed. For any weight gain of 3 pounds or more.   lidocaine  5 % Commonly known as: LIDODERM  Place 1 patch onto the skin daily. Remove & Discard patch within 12 hours or as directed by MD    loratadine  10 MG tablet Commonly known as: CLARITIN  Take 1 tablet (10 mg total) by mouth daily.   losartan  25 MG tablet Commonly known as: COZAAR  Take 0.5 tablets (12.5 mg total) by mouth at bedtime.   melatonin 5 MG Tabs Take 1 tablet (5 mg total) by mouth at bedtime.   pantoprazole  40 MG tablet Commonly known as: PROTONIX  Take 1 tablet (40 mg total) by mouth daily.   spironolactone  25 MG tablet Commonly known as: ALDACTONE  Take 0.5 tablets (12.5 mg total) by mouth daily.        Discharge Instructions: Please refer to Patient Instructions section of EMR for full details.  Patient was counseled important signs and symptoms that should prompt return to medical care, changes in medications, dietary instructions, activity restrictions, and follow up appointments.   Follow-Up Appointments:  Follow-up Information     Forest Coy, MD. Schedule an appointment as soon as possible for a visit in 1 week(s).   Specialty: Internal Medicine Why: July 24, 2023 at 1045 am, please arrive 15 minutes early for you appt. If you are unable to keep appt, please call to reschedule Contact information: [address] [place] [address] [phone]         [facility] Follow up on 07/17/2023.   Specialty: Cardiology Why: Follow-up with Dr. Zenaida at 10:20 AM in [facility] Entrance C, Free Valet Parking Please bring all medications to appointment Contact information: [address] [place]  [address] [phone]                Howell Lunger, DO 07/13/2023, 10:45 AM PGY-3, [facility]

## 2023-07-14 ENCOUNTER — Ambulatory Visit: Payer: Self-pay

## 2023-07-16 ENCOUNTER — Other Ambulatory Visit: Payer: Self-pay

## 2023-07-17 ENCOUNTER — Telehealth (HOSPITAL_COMMUNITY): Payer: Self-pay

## 2023-07-17 ENCOUNTER — Other Ambulatory Visit (HOSPITAL_COMMUNITY): Payer: Self-pay

## 2023-07-17 ENCOUNTER — Other Ambulatory Visit: Payer: Self-pay

## 2023-07-17 ENCOUNTER — Ambulatory Visit (HOSPITAL_COMMUNITY)
Admit: 2023-07-17 | Discharge: 2023-07-17 | Disposition: A | Discharge status: Home / Self Care | Payer: Medicaid Other | Source: Ambulatory Visit | Attending: Cardiology | Admitting: Cardiology

## 2023-07-17 ENCOUNTER — Encounter (HOSPITAL_COMMUNITY): Payer: Self-pay | Admitting: Cardiology

## 2023-07-17 VITALS — BP 94/60 | HR 90 | Wt 172.4 lb

## 2023-07-17 DIAGNOSIS — I5022 Chronic systolic (congestive) heart failure: Secondary | ICD-10-CM | POA: Insufficient documentation

## 2023-07-17 DIAGNOSIS — I110 Hypertensive heart disease with heart failure: Secondary | ICD-10-CM | POA: Insufficient documentation

## 2023-07-17 DIAGNOSIS — I5021 Acute systolic (congestive) heart failure: Secondary | ICD-10-CM

## 2023-07-17 DIAGNOSIS — Z7984 Long term (current) use of oral hypoglycemic drugs: Secondary | ICD-10-CM | POA: Diagnosis not present

## 2023-07-17 DIAGNOSIS — Z79899 Other long term (current) drug therapy: Secondary | ICD-10-CM | POA: Insufficient documentation

## 2023-07-17 LAB — BASIC METABOLIC PANEL
Anion gap: 9 (ref 5–15)
BUN: 8 mg/dL (ref 6–20)
CO2: 24 mmol/L (ref 22–32)
Calcium: 9.5 mg/dL (ref 8.9–10.3)
Chloride: 106 mmol/L (ref 98–111)
Creatinine, Ser: 0.84 mg/dL (ref 0.44–1.00)
GFR, Estimated: >60 mL/min (ref >60)
Glucose, Bld: 110 mg/dL — ABNORMAL HIGH (ref 70–99)
Potassium: 4.2 mmol/L (ref 3.5–5.1)
Sodium: 139 mmol/L (ref 135–145)

## 2023-07-17 LAB — HEMOGLOBIN A1C
Hgb A1c MFr Bld: 6.2 % — ABNORMAL HIGH (ref 4.8–5.6)
Mean Plasma Glucose: 131.24 mg/dL

## 2023-07-17 MED ORDER — SENNA-DOCUSATE SODIUM 8.6-50 MG PO TABS
1.0000 | ORAL_TABLET | ORAL | 2 refills | Status: DC | PRN
  Filled 2023-07-17 – 2023-11-17 (×2): qty 100, 100d supply, fill #0
  Filled 2023-12-17 – 2024-02-11 (×5): qty 100, 100d supply, fill #1
  Filled 2024-05-07: qty 100, 100d supply, fill #2

## 2023-07-17 NOTE — Progress Notes (Signed)
ADVANCED HEART FAILURE FOLLOW UP CLINIC NOTE  Referring Physician: Reymundo Poll, MD  Primary Care: Ivery Quale, MD Primary Cardiologist:  HPI: Amber Franco is a 53 y.o. female who presents for follow up of chronic systolic heart failure.      Patient presented with chest pain, orthopnea, and shortness of breath after recent deaths of her husband and father. Symptomatic with echocardiogram with EF of less than 20% with global LV hypokinesis, LV apex swirling and severely reduced RV function as well as moderate MVR. Underwent IV diuresis, GDMT, L/RHC (no ischemic disease), cardiac MR (Mild LV dilatation w/ severe systolic dysfunction (EF 25%), Normal RV w/ moderate systolic dysfunction (EF 37%), multifocal LGE possible sarcoidosis vs DCM vs prior myocarditis (recommended FDG-PET), moderate MR (22%), small pericardial effusion ). She was stable and started on spironolactone, farxiga, losartan, digoxin and transitioned to oral lasix.     SUBJECTIVE:  Patient overall states that she is doing fairly well since her discharge. She reports that she has had occasional chest pain but it is reproducible and not related to exertion. She otherwise has been active and denies any dizziness or syncopal episodes.   PMH, current medications, allergies, social history, and family history reviewed in epic.  PHYSICAL EXAM: Vitals:   07/17/23 1021  BP: 94/60  Pulse: 90  SpO2: 97%   GENERAL: Well nourished and in no apparent distress at rest.  HEENT: The mucous membranes are pink and moist.   PULM:  Normal work of breathing, clear to auscultation bilaterally. Respirations are unlabored.  CARDIAC:  JVP: not elevated         Normal rate with regular rhythm. No murmurs, rubs or gallops.  No lower extremity edema.  ABDOMEN: Soft, non-tender, non-distended. NEUROLOGIC: Patient is oriented x3 with no focal or lateralizing neurologic deficits.  PSYCH: Patients affect is appropriate, there is no evidence  of anxiety or depression.  SKIN: Warm and dry; no lesions or wounds. Warm and well perfused extremities.  DATA REVIEW  ECG: 07/2023: Sinus tachy, LAE  ECHO: 07/2023: LVEF 20-25%, RV function severely reduced as well  CATH: 07/2023: Normal right dominant coronary arteriography, RA 8, PA 48/32 (39), PCWP 25, Fick CO 3.55L/min, Fick CI 1.95L/min/m2  CMR:  cardiac MR (Mild LV dilatation w/ severe systolic dysfunction (EF 25%), Normal RV w/ moderate systolic dysfunction (EF 37%), multifocal LGE possible sarcoidosis vs DCM vs prior myocarditis (recommended FDG-PET), moderate MR (22%), small pericardial effusion ).   Heart failure review: - Classification: Heart failure with reduced EF - Etiology: Work up ongoing - NYHA Class: II - Volume status: Euvolemic - ACEi/ARB/ARNI: Currently up-titrating - Aldosterone antagonist: Currently up-titrating - Beta-blocker: Currently up-titrating - Digoxin: Maximally tolerated dose - Hydralazine/Nitrates: Intolerant - SGLT2i: Maximally tolerated dose - GLP-1: Not a candidate - Advanced therapies: Not needed at this time - ICD: Currently uptitrating GDMT  ASSESSMENT & PLAN:  Chronic systolic heart failure: Workup ongoing, negative LHC, MRI consistent with sarcoid versus myocarditis. Recent traumatic events also suggestive of takotsubo, but degree of LGE on MRI rare with stress induced cardiomyopathy.  - PET sarcoid ordered - Unable to titrate GDMT today given BP - Continue digoxin 0.125mg  daily - Continue farxiga 10mg  daily, furosemide 40mg  prn, losartan 12.5mg  daily - Metoprolol at next visit - Cardiac rehab - Continue spironolactone 12.5mg  daily  HTN -BP soft  I spent 30 minutes caring for this patient today including face to face time, ordering and reviewing labs, reviewing records from recent hospitalization, seeing the patient,  documenting in the record, and arranging follow ups.'   Clearnce Hasten, MD Advanced Heart Failure Mechanical  Circulatory Support 07/18/23

## 2023-07-17 NOTE — Patient Instructions (Signed)
TAKE Senna as needed for constipation.  Labs done today, your results will be available in MyChart, we will contact you for abnormal readings.  You have been referred to cardiac Rehab. They will call you to arrange your appointment.   Cardiac Sarcoidosis/Inflammation PET Scan Patient Instructions  Please report to Radiology at the [facility] Main Entrance 15 minutes early for your test.  [address] [place], [place] [address] BRING FOOD DIARY WITH YOU TO THIS APPOINTMENT For 24 hours before the test: Do not exercise! Do not eat after 5 pm the day before your test! To make sure that your scanning results are accurate, you MUST follow the sarcoid prep meal diet starting the day before your PET scan. This diet involves eating no carbohydrates 24 hours before the test.  You will keep a log of all that you eat the day before your test. If you have questions or do not understand this diet, please call [phone] for more information. If you are unable to follow this diet, please discuss an alternative strategy with the coordinator.  If you are diabetic, continue your diabetes medications as usual on the day before until you begin to fast. NO DIABETES MEDICATIONS ONCE YOU BEGIN TO FAST. What foods can I eat the day before my test?  Drink only water or black coffee (WITHOUT sugar, artificial sweetener, cream, or milk). Eggs (prepared without milk or cheese)  Meat that is either broiled or pan fried in butter WITHOUT breading (chicken, [place], bacon, meat-only sausage, hamburger, steak, fish) Butter, salt & pepper What foods must I AVOID the day before my test?  Do not consume alcoholic beverages, sodas, fruit juice, coffee creamer, or sports drinks  Do not eat vegetables, beans, nuts, fruits, juices, bread, grains, rice, pasta, potatoes, or any baked goods Do not eat dairy products (milk, cheese, etc.)  Do not eat mayonnaise, ketchup, tartar sauce, mustard, or other  condiments Do not add sugar, artificial sweeteners, or Splenda (sucralose) to foods or drinks  Do not eat breaded foods (like fried chicken)  Do not eat sweets, candy, gum, sweetened cough drops, lozenges, or sugar  Do not eat sweetened, grilled, or cured meats or meat with carbohydrate-containing additives (some sausages, ham, sweetened bacon) Suggested items for breakfast, lunch, or dinner:  Breakfast  3 to 5 fatty sausage links fried in butter. 3 to 5 bacon strips.  3 eggs pan fried in butter (no milk or cheese).  Lunch/Dinner  2 hamburger patties fried in butter. Chicken or fatty fish pan fried in butter. No breading. 8 oz. fatty steak pan fried in butter.  Beverages  Drink only water or black coffee. DO NOT ADD SUGAR, ARTIFICIAL SWEETENER, CREAM, OR MILK   For more information and frequently asked questions, please visit our website : [email or web address]   Cardiac Sarcoidosis/Inflammation PET Scan  Food Diary Name: _____________________________ Please fill in EXACTLY what you have eaten and when for 24 hours PRIOR to your test date.  Time Food/Drink Comments  Breakfast                Lunch                Dinner                Snacks                 DO NOT EXERCISE THE DAY BEFORE YOUR TEST DO NOT EAT AFTER 5 PM THE DAY BEFORE YOUR TEST.  ON THE DAY OF YOUR TEST, DO NOT EAT ANY FOOD AND ONLY DRINK CLEAR WATER! PLEASE BRING THIS FOOD DIARY WITH YOU TO YOUR APPOINTMENT  Your physician recommends that you schedule a follow-up appointment in: 2 months.  If you have any questions or concerns before your next appointment please send [place] a message through [place] or call our office at [phone].    TO LEAVE A MESSAGE FOR THE NURSE SELECT OPTION 2, PLEASE LEAVE A MESSAGE INCLUDING: YOUR NAME DATE OF BIRTH CALL BACK NUMBER REASON FOR CALL**this is important as we prioritize the call backs  YOU WILL RECEIVE A CALL BACK THE SAME DAY AS LONG AS YOU CALL  BEFORE 4:00 PM  At the [facility], you and your health needs are our priority. As part of our continuing mission to provide you with exceptional heart care, we have created designated Provider Care Teams. These Care Teams include your primary Cardiologist (physician) and Advanced Practice Providers (APPs- Physician Assistants and Nurse Practitioners) who all work together to provide you with the care you need, when you need it.   You may see any of the following providers on your designated Care Team at your next follow up: Dr Arvilla Meres Dr Marca Ancona Dr. Dorthula Nettles Dr. Clearnce Hasten Amy Filbert Schilder, NP Robbie Lis, [place] [facility] [place], [place] Brynda Peon, NP [place] Lee, NP Karle Plumber, PharmD   Please be sure to bring in all your medications bottles to every appointment.    Thank you for choosing [facility]

## 2023-07-17 NOTE — Progress Notes (Signed)
Heart and Vascular Care Navigation  07/17/2023  Amber Franco 01-09-1971 [id number]  Reason for Referral: grief counseling, just moved from [place]  Patient is participating in a Managed Medicaid Plan:Yes  Engaged with patient face to face for initial visit for Heart and Vascular Care Coordination.                                                                                                   Assessment: CSW met with pt in clinic to touch base on how she is coping with recent life changes.  Reports around Thanksgiving her father called her and ask that she come to [place] to be with him.  She moved from [place] to [place] to help his father at end of life and he died pretty quickly after.  States that her husband (not legally) then passed away end of 07/02/24 and that her mom had passed away earlier in 02-Aug-2022.    States that she is now living at her fathers home and trying to sell his belongings with the help of an aunt who is helping with expenses at the home as well.  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  States she was told that she can stay at her fathers house for 2 years due to his insurance paying on it for that long but is looking into apartments- CSW informed not much we can do until she has income or becomes unhoused.  Patient reports she has applied for disability and food stamps- pending applications for both.  Patient is interested in grief counseling for loss of her family members- provided with information on [facility] free grief counseling program.                                   HRT/VAS Care Coordination     Patients Home Cardiology Office Heart Failure Clinic   Outpatient Care Team Social Worker   Social Worker Name: Rosetta Posner [phone]   Living arrangements for the past 2 months Single Family Home   Lives with: Self   Patient Current Insurance Coverage Medicaid   Patient Has Concern  With Paying Medical Bills No   Does Patient Have Prescription Coverage? Yes   Current home services DME  rolling walker, rollator, bedside commode       Social History:                                                                             SDOH Screenings   Food Insecurity: No Food Insecurity (07/07/2023)  Housing: Low Risk  (07/07/2023)  Transportation Needs: No Transportation Needs (07/07/2023)  Utilities: Not At Risk (  07/07/2023)  Financial Resource Strain: Medium Risk (07/17/2023)  Physical Activity: Inactive (06/14/2019)   Received from [facility] and [employer] and [employer]  Social Connections: Unknown (06/14/2019)   Received from [facility] and [employer] and [employer]  Stress: No Stress Concern Present (06/14/2019)   Received from [employer] and [employer] and [employer]  Tobacco Use: High Risk (07/17/2023)    SDOH Interventions: Financial Resources:  Financial Strain Interventions: Intervention Not Indicated (no urgent needs but concerned about near future) Tree surgeon for Medical illustrator Insecurity:  Food Insecurity Interventions: Other (Comment) (applied for food stamps- pending)  Housing Insecurity:   Reports she has two years to stay in her fathers home- unclear if this is accurate  Transportation:   Given car by her father so has access to her own vehicle    Follow-up plan:    CSW will continue to follow and assist as needed  Burna Sis, LCSW Clinical Social Worker Advanced [facility] Failure Clinic Desk#: [phone] Cell#: [phone]

## 2023-07-17 NOTE — Telephone Encounter (Signed)
Pt insurance is active and benefits verified through [facility]. Co-pay $3.00, DED $0.00/$0.00 met, out of pocket $0.00/$0.00 met, co-insurance 0%. No pre-authorization required. Denesha H./[employer], 07/17/23 @ 1:52PM, CZY#[id number]   How many CR sessions are covered? TCR only 36 Is this a lifetime maximum or an annual maximum? Annual Has the member used any of these services to date? No Is there a time limit (weeks/months) on start of program and/or program completion? NO     Will contact patient to see if she is interested in the Cardiac Rehab Program. If interested, patient will need to complete follow up appt. Once completed, patient will be contacted for scheduling upon review by the RN Navigator.

## 2023-07-18 ENCOUNTER — Encounter (HOSPITAL_COMMUNITY): Payer: Self-pay | Admitting: Cardiology

## 2023-07-18 ENCOUNTER — Telehealth: Payer: Self-pay

## 2023-07-18 NOTE — Telephone Encounter (Signed)
No pre cert required for Cardiac PET Scan

## 2023-07-22 ENCOUNTER — Encounter: Payer: Self-pay | Admitting: Family Medicine

## 2023-07-24 ENCOUNTER — Ambulatory Visit: Payer: Medicaid Other | Admitting: Student

## 2023-07-24 ENCOUNTER — Ambulatory Visit (HOSPITAL_COMMUNITY)
Admission: RE | Admit: 2023-07-24 | Discharge: 2023-07-24 | Disposition: A | Discharge status: Home / Self Care | Payer: Medicaid Other | Source: Ambulatory Visit | Attending: Internal Medicine | Admitting: Internal Medicine

## 2023-07-24 VITALS — BP 106/80 | HR 87 | Temp 97.9°F | Ht 62.0 in | Wt 171.8 lb

## 2023-07-24 DIAGNOSIS — R079 Chest pain, unspecified: Secondary | ICD-10-CM

## 2023-07-24 DIAGNOSIS — I5021 Acute systolic (congestive) heart failure: Secondary | ICD-10-CM | POA: Diagnosis not present

## 2023-07-24 DIAGNOSIS — I440 Atrioventricular block, first degree: Secondary | ICD-10-CM | POA: Insufficient documentation

## 2023-07-24 DIAGNOSIS — R9431 Abnormal electrocardiogram [ECG] [EKG]: Secondary | ICD-10-CM | POA: Diagnosis not present

## 2023-07-24 DIAGNOSIS — R42 Dizziness and giddiness: Secondary | ICD-10-CM | POA: Insufficient documentation

## 2023-07-24 DIAGNOSIS — R0789 Other chest pain: Secondary | ICD-10-CM | POA: Insufficient documentation

## 2023-07-24 MED ORDER — LIDOCAINE 5 % EX PTCH: 1.0000 | MEDICATED_PATCH | CUTANEOUS | Status: DC

## 2023-07-24 NOTE — Assessment & Plan Note (Signed)
Euvolemic on exam today.  She states that she is taking 2 doses of as needed Lasix 40 since hospital discharge.  Also states that she makes up some of her medications while taking her digoxin until recently, but she has resumed this medication.

## 2023-07-24 NOTE — Progress Notes (Signed)
Subjective:  CC: Hospital follow-up  HPI:  [name] is a 53 y.o. person with a past medical history stated below and presents today for the stated chief complaint. Please see problem based assessment and plan for additional details.  Of note the patient follows with Dr. Elwyn Franco of cardiology, as well as Dr. Threasa Franco of family medicine.  She is established with the family medicine practice, but was given a hospital follow-up appointment with internal medicine.  She will continue to follow-up with Dr. Threasa Franco the family medicine as her PCP.   Past Medical History:  Diagnosis Date   Allergy    Hypertension    Sickle cell trait (HCC)    Stroke [facility])     Current Outpatient Medications on File Prior to Visit  Medication Sig Dispense Refill   acetaminophen (TYLENOL) 325 MG tablet Take 2 tablets (650 mg total) by mouth every 6 (six) hours as needed for mild pain (pain score 1-3) or moderate pain (pain score 4-6).     dapagliflozin propanediol (FARXIGA) 10 MG TABS tablet Take 1 tablet (10 mg total) by mouth daily. 30 tablet 0   digoxin (LANOXIN) 0.125 MG tablet Take 1 tablet (0.125 mg total) by mouth daily. 30 tablet 0   fluticasone (FLONASE) 50 MCG/ACT nasal spray Place 1 spray into both nostrils daily. 16 g 0   furosemide (LASIX) 40 MG tablet Take 1 tablet (40 mg total) by mouth daily as needed. For any weight gain of 3 pounds or more. 30 tablet 11   loratadine (CLARITIN) 10 MG tablet Take 1 tablet (10 mg total) by mouth daily.     losartan (COZAAR) 25 MG tablet Take 0.5 tablets (12.5 mg total) by mouth at bedtime. 30 tablet 0   melatonin 5 MG TABS Take 1 tablet (5 mg total) by mouth at bedtime. 30 tablet 0   pantoprazole (PROTONIX) 40 MG tablet Take 1 tablet (40 mg total) by mouth daily. 30 tablet 0   sennosides-docusate sodium (SENOKOT-S) 8.6-50 MG tablet Take 1 tablet by mouth as needed for constipation. 100 tablet 2   spironolactone (ALDACTONE) 25 MG tablet Take 0.5 tablets (12.5 mg  total) by mouth daily. 30 tablet 0   No current facility-administered medications on file prior to visit.    Review of Systems: Please see assessment and plan for pertinent positives and negatives.  Objective:   Vitals:   07/24/23 1049  BP: 106/80  Pulse: 87  Temp: 97.9 F (36.6 C)  TempSrc: Oral  SpO2: 99%  Weight: 171 lb 12.8 oz (77.9 kg)  Height: 5\' 2"  (1.575 m)    Physical Exam: Constitutional: Well-appearing Cardiovascular: Regular rate and rhythm Pulmonary/Chest: lungs clear to auscultation bilaterally Abdominal: soft, non-tender, non-distended Extremities: No edema of the lower extremities bilaterally MSK: Tenderness to palpation of the upper ribs in the midclavicular line. Psych: Pleasant affect, anxious mood Thought process is linear and is goal-directed.     Assessment & Plan:  Chest pain This is a 53 year old female with recent hospital discharge for HFrEF (EF less than 20%) presenting to the clinic for hospital follow-up. She has had chest pain for the past 8 days.  This was communicated with her cardiologist and PCP as well.  She does endorse orthopnea, and shortness of breath during these episodes.  The chest pain is sharp, and reproducible with palpation.  She says at times it radiates to her left shoulder and goes numb for 40 minutes an hour.  He gets better with repositioning.  Based on the reproducibility of her pain with palpation, etiology favored to be noncardiac.  Though she does have some aspects of anginal pain or pericarditis.  EKG today with no major ST changes to suggest ACS or pericarditis.  Plan: Return precautions discussed Continue follow-up with cardiology/PCP Refill lidocaine patches.   Orthostatic dizziness Patient endorses is some ongoing orthostatic dizziness.  Orthostatic vitals today with blood pressures 105/68 laying, 108/71 sitting,  101/69 standing 0 min, 107/69 standing 3 min.  She did get dizzy when standing, and he did to sit  down.  She denies darkening of her vision or presyncope.  She stated that she felt that the room was spinning. Her blood pressures are on the softer side, but she is normotensive and orthostatics are negative. Plan: Continue current regiment, discussion with PCP/cardiology regarding ongoing dizziness and modification of drug regimen.   Acute systolic heart failure (HCC) Euvolemic on exam today.  She states that she is taking 2 doses of as needed Lasix 40 since hospital discharge.  Also states that she makes up some of her medications while taking her digoxin until recently, but she has resumed this medication.   Patient discussed with Dr. Julian Reil MD [facility] Internal Medicine  PGY-1 Pager: [phone]  Phone: [phone] Date 07/24/2023  Time 9:09 PM

## 2023-07-24 NOTE — Assessment & Plan Note (Addendum)
Patient endorses is some ongoing orthostatic dizziness.  Orthostatic vitals today with blood pressures 105/68 laying, 108/71 sitting,  101/69 standing 0 min, 107/69 standing 3 min.  She did get dizzy when standing, and he did to sit down.  She denies darkening of her vision or presyncope.  She stated that she felt that the room was spinning. Her blood pressures are on the softer side, but she is normotensive and orthostatics are negative. Plan: Continue current regiment, discussion with PCP/cardiology regarding ongoing dizziness and modification of drug regimen.

## 2023-07-24 NOTE — Patient Instructions (Addendum)
Thank you, Ms.Carolynn Comment for allowing [place] to provide your care today.   Start the following medications: Meds ordered this encounter  Medications   lidocaine (LIDODERM) 5 %    Sig: Place 1 patch onto the skin daily. Remove & Discard patch within 12 hours or as directed by MD      Follow up: 08/07/2023 10:05 AM Ivery Quale, MD [facility] RESIDENT  Please call your cardiologist to set up an appointment to see them.   Please remember: If you have any additional episodes of chest pain and / or shortness of breath that does not resolve please seek additional medical help.     We look forward to seeing you next time. Please call our clinic at [phone] if you have any questions or concerns. The best time to call is Monday-Friday from 9am-4pm, but there is someone available 24/7. If after hours or the weekend, call the main hospital number and ask for the Internal Medicine Resident On-Call. If you need medication refills, please notify your pharmacy one week in advance and they will send [place] a request.   Thank you for trusting me with your care. Wishing you the best!  Lovie Macadamia MD [facility]

## 2023-07-24 NOTE — Assessment & Plan Note (Addendum)
This is a 53 year old female with recent hospital discharge for HFrEF (EF less than 20%) presenting to the clinic for hospital follow-up. She has had chest pain for the past 8 days.  This was communicated with her cardiologist and PCP as well.  She does endorse orthopnea, and shortness of breath during these episodes.  The chest pain is sharp, and reproducible with palpation.  She says at times it radiates to her left shoulder and goes numb for 40 minutes an hour.  She gets better with repositioning.  Based on the reproducibility of her pain with palpation, etiology favored to be noncardiac.  Though she does have some aspects of anginal pain or pericarditis.  EKG today with no major ST changes to suggest ACS or pericarditis.  Plan: Return precautions discussed Continue follow-up with cardiology/PCP Refill lidocaine patches.

## 2023-07-25 NOTE — Progress Notes (Signed)
Internal Medicine Clinic Attending  Case discussed with the resident at the time of the visit.  We reviewed the resident's history and exam and pertinent patient test results.  I agree with the assessment, diagnosis, and plan of care documented in the resident's note.  Family medicine patient who was recently admitted. I am not sure how/why an appointment was scheduled in [facility], however patient wishes to continue care with family medicine and has an appointment scheduled there. No emergent issues today. Encouraged continued f/u with cardiology and family medicine.

## 2023-07-29 ENCOUNTER — Other Ambulatory Visit (HOSPITAL_COMMUNITY): Payer: Self-pay

## 2023-08-04 ENCOUNTER — Other Ambulatory Visit: Payer: Self-pay | Admitting: Student

## 2023-08-04 ENCOUNTER — Other Ambulatory Visit (HOSPITAL_COMMUNITY): Payer: Self-pay

## 2023-08-05 ENCOUNTER — Encounter (HOSPITAL_COMMUNITY): Payer: Self-pay

## 2023-08-05 ENCOUNTER — Other Ambulatory Visit (HOSPITAL_COMMUNITY): Payer: Self-pay

## 2023-08-05 MED ORDER — PANTOPRAZOLE SODIUM 40 MG PO TBEC
40.0000 mg | DELAYED_RELEASE_TABLET | Freq: Every day | ORAL | 0 refills | Status: DC
  Filled 2023-08-05: qty 30, 30d supply, fill #0

## 2023-08-05 MED ORDER — DIGOXIN 125 MCG PO TABS
0.1250 mg | ORAL_TABLET | Freq: Every day | ORAL | 0 refills | Status: DC
  Filled 2023-08-05: qty 30, 30d supply, fill #0

## 2023-08-05 MED ORDER — DAPAGLIFLOZIN PROPANEDIOL 10 MG PO TABS
10.0000 mg | ORAL_TABLET | Freq: Every day | ORAL | 0 refills | Status: DC
  Filled 2023-08-05: qty 30, 30d supply, fill #0

## 2023-08-05 MED ORDER — FLUTICASONE PROPIONATE 50 MCG/ACT NA SUSP
1.0000 | Freq: Every day | NASAL | 0 refills | Status: DC
  Filled 2023-08-05: qty 16, 60d supply, fill #0

## 2023-08-05 NOTE — Telephone Encounter (Signed)
Chart reviewed. Rx refilled per latest cardiology office visit. Losartan and spironolactone should not require refill at this time.

## 2023-08-07 ENCOUNTER — Ambulatory Visit: Payer: Medicaid Other | Admitting: Family Medicine

## 2023-08-07 ENCOUNTER — Encounter: Payer: Self-pay | Admitting: Family Medicine

## 2023-08-07 ENCOUNTER — Other Ambulatory Visit (HOSPITAL_COMMUNITY): Payer: Self-pay

## 2023-08-07 ENCOUNTER — Inpatient Hospital Stay: Payer: Self-pay | Admitting: Family Medicine

## 2023-08-07 VITALS — BP 114/85 | HR 109 | Ht 62.0 in | Wt 183.0 lb

## 2023-08-07 DIAGNOSIS — R4589 Other symptoms and signs involving emotional state: Secondary | ICD-10-CM

## 2023-08-07 DIAGNOSIS — I5021 Acute systolic (congestive) heart failure: Secondary | ICD-10-CM | POA: Diagnosis not present

## 2023-08-07 DIAGNOSIS — Z23 Encounter for immunization: Secondary | ICD-10-CM

## 2023-08-07 DIAGNOSIS — Z0000 Encounter for general adult medical examination without abnormal findings: Secondary | ICD-10-CM

## 2023-08-07 MED ORDER — LOSARTAN POTASSIUM 25 MG PO TABS
12.5000 mg | ORAL_TABLET | Freq: Every day | ORAL | 0 refills | Status: DC
  Filled 2023-08-07 – 2023-09-09 (×3): qty 30, 60d supply, fill #0

## 2023-08-07 MED ORDER — MELATONIN 5 MG PO TABS
5.0000 mg | ORAL_TABLET | Freq: Every day | ORAL | 0 refills | Status: DC
  Filled 2023-08-07: qty 30, 30d supply, fill #0

## 2023-08-07 MED ORDER — DAPAGLIFLOZIN PROPANEDIOL 10 MG PO TABS
10.0000 mg | ORAL_TABLET | Freq: Every day | ORAL | 0 refills | Status: DC
  Filled 2023-08-07: qty 30, 30d supply, fill #0

## 2023-08-07 MED ORDER — DIGOXIN 125 MCG PO TABS
0.1250 mg | ORAL_TABLET | Freq: Every day | ORAL | 0 refills | Status: DC
  Filled 2023-08-07: qty 30, 30d supply, fill #0

## 2023-08-07 MED ORDER — SPIRONOLACTONE 25 MG PO TABS
12.5000 mg | ORAL_TABLET | Freq: Every day | ORAL | 0 refills | Status: DC
  Filled 2023-08-07 – 2023-09-09 (×2): qty 30, 60d supply, fill #0

## 2023-08-07 NOTE — Progress Notes (Signed)
    SUBJECTIVE:   CHIEF COMPLAINT / HPI:   Acute HF  HFU Recently hospitalized for acute onset HF, with unclear etiology, undergoing ongoing workup with cardiology. Since discharging from the hospital, patient has been overall doing better. She has been tracking her weight and taking her lasix  as needed as prescribed. She has been walking often. Has some intermittent sternal pain and rib pain beneath her L breast, no bruising noted. Recent fall with her chest landing on tub she was carrying prior to her recent admission.   Depressed Mood Patient has unfortunately experienced additional life stressors lately. She has not yet found a therapist. Is interested in trying therapy before medications. No current SI/HI.    PERTINENT  PMH / PSH: Acute HF, NICM, Depressed Mood  OBJECTIVE:   BP 114/85   Pulse (!) 109   Ht 5' 2 (1.575 m)   Wt 183 lb (83 kg) Comment: pt reported  SpO2 100%   BMI 33.47 kg/m   General: Well-appearing. Resting comfortably in room. CV: Normal S1/S2. No extra heart sounds. Warm and well-perfused. Reproducible chest wall tenderness, including sternal and lower L ribcage. Pulm: Breathing comfortably on room air. CTAB. No increased WOB. Abd: Soft, non-tender, non-distended. Skin/Ext:  Warm, dry. No LE edema noted. Psych: Pleasant and appropriate. Tearful when discussing life events.    ASSESSMENT/PLAN:   Assessment & Plan Acute systolic heart failure (HCC) Appears well controlled on current GDMT: Farxiga  10, Digoxin  0.125, Cozaar  12.5, Aldactone  12.5 daily. VSS at visit today. Reproducible chest wall tenderness suggest MSK-related discomfort, possibly related to previous fall injury onto tub.  - Refilled current GDMT regimen per latest cardiology visit  - Tylenol /ibuprofen , lidocaine  patches for rib/sternal pains as needed  - Continue to fu ongoing cardiac workup/cardiology recs - Fu PET sarcoid  Depressed mood PHQ-9 score of 20 today. No SI/HI. Several recent  significant life events.  - Discussed and provided therapy resources - CTM Encounter for immunization Patient received annual flu vaccine today.   Scheduled patient a clinic appt on 09/09/23 for follow up as patient travels multiple hours to [facility] and has a Cardiology appt scheduled that day already.   Damien Cassis, MD [facility]

## 2023-08-07 NOTE — Patient Instructions (Addendum)
 Thank you for visiting clinic today - it is always our pleasure to care for you.  Today we discussed how you've been doing since your hospital stay. Your chest symptoms seem to be muscular-skeletal related, possibly from injury to the area during your fall with the tub. Please use over-the-counter lidocaine  patches as needed to help with the pain. You may also try tylenol  and/or ibuprofen  for pain relief as well.   Please see attached for therapy resources.   You received your flu shot today.   I have scheduled you a clinic appointment on 09/09/23 after your cardiology appt that day.   Future Appointments  Date Time Provider Department Center  09/03/2023  7:20 AM [facility] PET CT 1 [facility] [place]  09/09/2023 10:20 AM Zenaida Morene PARAS, MD [facility] None  09/09/2023  1:30 PM Christia Budds, MD [facility]   Reach out any time with any questions or concerns you may have - we are here for you!  Damien Cassis, MD [facility] [phone]  Therapy and Counseling Resources Most providers on this list will take Medicaid. Patients with commercial insurance or Medicare should contact their insurance company to get a list of in network providers.  [facility]:Psychiatry and Counseling [address] [place], [place] [address] [phone]  [facility]  [address], Suite [place], [place] [address]      [phone]  [facility] [address]  [place], [place] [address] [phone]  [facility] [address]  [place], [place] [address]       [phone]     [facility] (virtual only) [phone]  Janit Griffins [facility] [address], [place], [place] [phone]  [facility]:  [address] [place] [phone]  [facility]:  [address] [place] [phone]  [facility] (under & uninsured) [address]    [place] [phone]    [email or web address]    [facility] [address]  [place]    [phone]  [facility] -[address]     Phone:  [phone]     [facility]-  [address]  [phone]   [facility] [address]      [place], [place] [phone]  [facility]: [address] [place], [place], [place]  [phone]   [facility] (Spanish therapist) [email or web address]  [address]   [place] [address]    [phone]    [facility]   [address],  [place], [place]  [phone]   [facility]  [address] [place]  [phone]  [facility]  [address] [place], [place]        [phone]  Open Access/Walk In Clinic under & uninsured  [facility]  [address] [place], [place] [phone] Crisis [phone]  [facility] [address],  (Spanish)   [address] , [place]: [phone]) 8:30 - 12; 1 - 2:30  [facility] [employer],  [address] [place], [place]    [phone]):8:30 - 12; 2 - 3PM  [facility] [employer],  [address],  [place]; [phone]):   Mon - Fri 8 AM - 5 PM  [facility]  [address] [place]  MWF 12:30 to 3:00 or call to schedule an appointment  [phone]  Specific Provider options Psychology Today  https://www.psychologytoday.com/us  click on find a therapist  enter your zip code left side and select or tailor a therapist for your specific need.   [facility] Provider Directory [email or web address]  (Medicaid)   Follow all drop down to find a provider  Social Support program [facility] [place] [phone] or photosolver.pl [address], [place], [place] Recovery support  and educational   24- Hour Availability:   [facility]  [address] [place], [place] [phone] Crisis [phone]  [facility] [phone]  [facility]  [phone]   [facility]  [phone] (after hours)  Therapeutic Alternative/Mobile Crisis   [phone]  [place]  National Suicide Hotline  4581577841 MERRILYN)  Call 911 or go to emergency room  [facility] [address] [facility]  [phone]);  [place]  [phone]); [place], [place], [place], [place], [place], [place], [place]

## 2023-08-08 ENCOUNTER — Emergency Department (HOSPITAL_COMMUNITY): Payer: Medicaid Other

## 2023-08-08 ENCOUNTER — Encounter (HOSPITAL_COMMUNITY): Payer: Self-pay | Admitting: Emergency Medicine

## 2023-08-08 ENCOUNTER — Other Ambulatory Visit: Payer: Self-pay

## 2023-08-08 ENCOUNTER — Observation Stay (HOSPITAL_COMMUNITY)
Admission: EM | Admit: 2023-08-08 | Discharge: 2023-08-10 | Disposition: A | Discharge status: Home / Self Care | Payer: Medicaid Other | Attending: Family Medicine | Admitting: Family Medicine

## 2023-08-08 DIAGNOSIS — I509 Heart failure, unspecified: Principal | ICD-10-CM

## 2023-08-08 DIAGNOSIS — I110 Hypertensive heart disease with heart failure: Secondary | ICD-10-CM | POA: Insufficient documentation

## 2023-08-08 DIAGNOSIS — Z7984 Long term (current) use of oral hypoglycemic drugs: Secondary | ICD-10-CM | POA: Insufficient documentation

## 2023-08-08 DIAGNOSIS — Z1152 Encounter for screening for COVID-19: Secondary | ICD-10-CM | POA: Diagnosis not present

## 2023-08-08 DIAGNOSIS — I5021 Acute systolic (congestive) heart failure: Secondary | ICD-10-CM | POA: Diagnosis not present

## 2023-08-08 DIAGNOSIS — Z79899 Other long term (current) drug therapy: Secondary | ICD-10-CM | POA: Diagnosis not present

## 2023-08-08 DIAGNOSIS — Z23 Encounter for immunization: Secondary | ICD-10-CM | POA: Diagnosis not present

## 2023-08-08 DIAGNOSIS — R4589 Other symptoms and signs involving emotional state: Secondary | ICD-10-CM | POA: Insufficient documentation

## 2023-08-08 DIAGNOSIS — R0602 Shortness of breath: Secondary | ICD-10-CM | POA: Diagnosis not present

## 2023-08-08 LAB — URINALYSIS, ROUTINE W REFLEX MICROSCOPIC
Bilirubin Urine: NEGATIVE
Glucose, UA: NEGATIVE mg/dL
Hgb urine dipstick: NEGATIVE
Ketones, ur: NEGATIVE mg/dL
Leukocytes,Ua: NEGATIVE
Nitrite: NEGATIVE
Protein, ur: NEGATIVE mg/dL
Specific Gravity, Urine: 1.009 (ref 1.005–1.030)
pH: 7 (ref 5.0–8.0)

## 2023-08-08 LAB — BASIC METABOLIC PANEL
Anion gap: 11 (ref 5–15)
BUN: 5 mg/dL — ABNORMAL LOW (ref 6–20)
CO2: 25 mmol/L (ref 22–32)
Calcium: 9.7 mg/dL (ref 8.9–10.3)
Chloride: 104 mmol/L (ref 98–111)
Creatinine, Ser: 0.84 mg/dL (ref 0.44–1.00)
GFR, Estimated: >60 mL/min (ref >60)
Glucose, Bld: 93 mg/dL (ref 70–99)
Potassium: 3.5 mmol/L (ref 3.5–5.1)
Sodium: 140 mmol/L (ref 135–145)

## 2023-08-08 LAB — CBC
HCT: 39.2 % (ref 36.0–46.0)
Hemoglobin: 12.8 g/dL (ref 12.0–15.0)
MCH: 29.1 pg (ref 26.0–34.0)
MCHC: 32.7 g/dL (ref 30.0–36.0)
MCV: 89.1 fL (ref 80.0–100.0)
Platelets: 233 10*3/uL (ref 150–400)
RBC: 4.4 MIL/uL (ref 3.87–5.11)
RDW: 12.5 % (ref 11.5–15.5)
WBC: 7.1 10*3/uL (ref 4.0–10.5)
nRBC: 0 % (ref 0.0–0.2)

## 2023-08-08 LAB — TROPONIN I (HIGH SENSITIVITY)
Troponin I (High Sensitivity): 5 ng/L (ref <18)
Troponin I (High Sensitivity): 7 ng/L (ref <18)

## 2023-08-08 LAB — BRAIN NATRIURETIC PEPTIDE: B Natriuretic Peptide: 303.3 pg/mL — ABNORMAL HIGH (ref 0.0–100.0)

## 2023-08-08 NOTE — Assessment & Plan Note (Signed)
 Patient received annual flu vaccine today.

## 2023-08-08 NOTE — ED Triage Notes (Signed)
 Pt reports bilateral leg swelling and SHOB that started 2 days ago. Pt reports her PCP instructed her to come to the ER for evaluation.

## 2023-08-08 NOTE — Assessment & Plan Note (Signed)
 PHQ-9 score of 20 today. No SI/HI. Several recent significant life events.  - Discussed and provided therapy resources - CTM

## 2023-08-08 NOTE — ED Notes (Signed)
 Called no answer

## 2023-08-08 NOTE — Assessment & Plan Note (Addendum)
 Appears well controlled on current GDMT: Farxiga  10, Digoxin  0.125, Cozaar  12.5, Aldactone  12.5 daily. VSS at visit today. Reproducible chest wall tenderness suggest MSK-related discomfort, possibly related to previous fall injury onto tub.  - Refilled current GDMT regimen per latest cardiology visit  - Tylenol /ibuprofen , lidocaine  patches for rib/sternal pains as needed  - Continue to fu ongoing cardiac workup/cardiology recs - Fu PET sarcoid

## 2023-08-08 NOTE — Assessment & Plan Note (Signed)
>>  ASSESSMENT AND PLAN FOR DEPRESSED MOOD WRITTEN ON 08/08/2023  9:58 PM BY Ivery Quale, MD  PHQ-9 score of 20 today. No SI/HI. Several recent significant life events.  - Discussed and provided therapy resources - CTM

## 2023-08-08 NOTE — ED Provider Triage Note (Signed)
 Emergency Medicine Provider Triage Evaluation Note  Amber Franco , a 53 y.o. female  was evaluated in triage.  Pt complains of shortness of breath and leg swelling.  Review of Systems  Positive: Leg swelling, shortness of breath, orthopnea, chest pain Negative: Fever, chills, abdominal pain, nausea, vomiting, diarrhea  Physical Exam  BP 115/75 (BP Location: Right Arm)   Pulse 89   Temp 98.8 F (37.1 C)   Resp 16   SpO2 98%  Gen:   Awake, no distress   Resp:  Normal effort  MSK:   Moves extremities without difficulty  Other:    Medical Decision Making  Medically screening exam initiated at 2:53 PM.  Appropriate orders placed.  Amber Franco was informed that the remainder of the evaluation will be completed by another provider, this initial triage assessment does not replace that evaluation, and the importance of remaining in the ED until their evaluation is complete.  Labs and imaging ordered   Amber Franco 08/08/23 8497

## 2023-08-09 ENCOUNTER — Observation Stay (HOSPITAL_BASED_OUTPATIENT_CLINIC_OR_DEPARTMENT_OTHER): Payer: Medicaid Other

## 2023-08-09 DIAGNOSIS — R0602 Shortness of breath: Secondary | ICD-10-CM

## 2023-08-09 DIAGNOSIS — I5023 Acute on chronic systolic (congestive) heart failure: Secondary | ICD-10-CM | POA: Diagnosis not present

## 2023-08-09 DIAGNOSIS — I509 Heart failure, unspecified: Secondary | ICD-10-CM

## 2023-08-09 HISTORY — DX: Heart failure, unspecified: I50.9

## 2023-08-09 LAB — BASIC METABOLIC PANEL
Anion gap: 11 (ref 5–15)
BUN: 7 mg/dL (ref 6–20)
CO2: 22 mmol/L (ref 22–32)
Calcium: 9.6 mg/dL (ref 8.9–10.3)
Chloride: 104 mmol/L (ref 98–111)
Creatinine, Ser: 0.97 mg/dL (ref 0.44–1.00)
GFR, Estimated: >60 mL/min (ref >60)
Glucose, Bld: 131 mg/dL — ABNORMAL HIGH (ref 70–99)
Potassium: 4.1 mmol/L (ref 3.5–5.1)
Sodium: 137 mmol/L (ref 135–145)

## 2023-08-09 LAB — COMPREHENSIVE METABOLIC PANEL
ALT: 48 U/L — ABNORMAL HIGH (ref 0–44)
AST: 44 U/L — ABNORMAL HIGH (ref 15–41)
Albumin: 3.8 g/dL (ref 3.5–5.0)
Alkaline Phosphatase: 33 U/L — ABNORMAL LOW (ref 38–126)
Anion gap: 10 (ref 5–15)
BUN: 5 mg/dL — ABNORMAL LOW (ref 6–20)
CO2: 24 mmol/L (ref 22–32)
Calcium: 9.1 mg/dL (ref 8.9–10.3)
Chloride: 104 mmol/L (ref 98–111)
Creatinine, Ser: 0.78 mg/dL (ref 0.44–1.00)
GFR, Estimated: >60 mL/min (ref >60)
Glucose, Bld: 107 mg/dL — ABNORMAL HIGH (ref 70–99)
Potassium: 3 mmol/L — ABNORMAL LOW (ref 3.5–5.1)
Sodium: 138 mmol/L (ref 135–145)
Total Bilirubin: 0.8 mg/dL (ref 0.0–1.2)
Total Protein: 6.7 g/dL (ref 6.5–8.1)

## 2023-08-09 LAB — RESP PANEL BY RT-PCR (RSV, FLU A&B, COVID)  RVPGX2
Influenza A by PCR: NEGATIVE
Influenza B by PCR: NEGATIVE
Resp Syncytial Virus by PCR: NEGATIVE
SARS Coronavirus 2 by RT PCR: NEGATIVE

## 2023-08-09 LAB — CBC
HCT: 40.5 % (ref 36.0–46.0)
Hemoglobin: 13.4 g/dL (ref 12.0–15.0)
MCH: 29.5 pg (ref 26.0–34.0)
MCHC: 33.1 g/dL (ref 30.0–36.0)
MCV: 89 fL (ref 80.0–100.0)
Platelets: 245 10*3/uL (ref 150–400)
RBC: 4.55 MIL/uL (ref 3.87–5.11)
RDW: 12.5 % (ref 11.5–15.5)
WBC: 8.1 10*3/uL (ref 4.0–10.5)
nRBC: 0 % (ref 0.0–0.2)

## 2023-08-09 LAB — ECHOCARDIOGRAM LIMITED
Calc EF: 41.2 %
Height: 62 in
S' Lateral: 4.8 cm
Single Plane A2C EF: 39.7 %
Single Plane A4C EF: 38.4 %
Weight: 2970.04 [oz_av]

## 2023-08-09 LAB — MAGNESIUM: Magnesium: 1.5 mg/dL — ABNORMAL LOW (ref 1.7–2.4)

## 2023-08-09 LAB — DIGOXIN LEVEL: Digoxin Level: 0.5 ng/mL — ABNORMAL LOW (ref 0.8–2.0)

## 2023-08-09 MED ORDER — DAPAGLIFLOZIN PROPANEDIOL 10 MG PO TABS
10.0000 mg | ORAL_TABLET | Freq: Every day | ORAL | Status: DC
  Administered 2023-08-09 – 2023-08-10 (×2): 10 mg via ORAL
  Filled 2023-08-09 (×2): qty 1

## 2023-08-09 MED ORDER — DIGOXIN 125 MCG PO TABS
0.1250 mg | ORAL_TABLET | Freq: Every day | ORAL | Status: DC
  Administered 2023-08-09 – 2023-08-10 (×2): 0.125 mg via ORAL
  Filled 2023-08-09 (×2): qty 1

## 2023-08-09 MED ORDER — FUROSEMIDE 20 MG PO TABS: 40.0000 mg | ORAL_TABLET | Freq: Once | ORAL | Status: DC

## 2023-08-09 MED ORDER — PERFLUTREN LIPID MICROSPHERE
1.0000 mL | INTRAVENOUS | Status: AC | PRN
Start: 2023-08-09 — End: 2023-08-09
  Administered 2023-08-09: 2.5 mL via INTRAVENOUS

## 2023-08-09 MED ORDER — FUROSEMIDE 10 MG/ML IJ SOLN
40.0000 mg | Freq: Once | INTRAMUSCULAR | Status: AC
  Administered 2023-08-09: 40 mg via INTRAVENOUS
  Filled 2023-08-09: qty 4

## 2023-08-09 MED ORDER — ENOXAPARIN SODIUM 40 MG/0.4ML IJ SOSY
40.0000 mg | PREFILLED_SYRINGE | INTRAMUSCULAR | Status: DC
  Administered 2023-08-09 – 2023-08-10 (×2): 40 mg via SUBCUTANEOUS
  Filled 2023-08-09 (×2): qty 0.4

## 2023-08-09 MED ORDER — MELATONIN 3 MG PO TABS
3.0000 mg | ORAL_TABLET | Freq: Every day | ORAL | Status: DC
  Administered 2023-08-09: 3 mg via ORAL
  Filled 2023-08-09: qty 1

## 2023-08-09 MED ORDER — PNEUMOCOCCAL 20-VAL CONJ VACC 0.5 ML IM SUSY
0.5000 mL | PREFILLED_SYRINGE | INTRAMUSCULAR | Status: AC
  Administered 2023-08-10: 0.5 mL via INTRAMUSCULAR
  Filled 2023-08-09: qty 0.5

## 2023-08-09 MED ORDER — MAGNESIUM SULFATE 2 GM/50ML IV SOLN
2.0000 g | Freq: Once | INTRAVENOUS | Status: AC
  Administered 2023-08-09: 2 g via INTRAVENOUS
  Filled 2023-08-09: qty 50

## 2023-08-09 MED ORDER — ACETAMINOPHEN 325 MG PO TABS
650.0000 mg | ORAL_TABLET | Freq: Four times a day (QID) | ORAL | Status: DC | PRN
  Administered 2023-08-09 – 2023-08-10 (×3): 650 mg via ORAL
  Filled 2023-08-09 (×3): qty 2

## 2023-08-09 MED ORDER — POTASSIUM CHLORIDE 20 MEQ PO PACK
40.0000 meq | PACK | ORAL | Status: AC
  Administered 2023-08-09 (×2): 40 meq via ORAL
  Filled 2023-08-09 (×2): qty 2

## 2023-08-09 MED ORDER — LORATADINE 10 MG PO TABS
10.0000 mg | ORAL_TABLET | Freq: Every day | ORAL | Status: DC
Start: 2023-08-09 — End: 2023-08-10
  Administered 2023-08-09 – 2023-08-10 (×2): 10 mg via ORAL
  Filled 2023-08-09 (×2): qty 1

## 2023-08-09 NOTE — Assessment & Plan Note (Signed)
 Patient presented to ED for acute onset of shortness of breath and increase in lower extremity swelling for the past two days. Patient took 1 40mg  lasix  dose yesterday but did not notice significant output.  - Admit to FMTS, Attending: Dr. Neal - 40 mg IV lasix  x1  - Strict I/Os - Echocardiogram - Bladder Scan to rule out retention  - Vitals per floor - AM CMP, CBC

## 2023-08-09 NOTE — Plan of Care (Signed)
 FMTS Interim Progress Note  S:Patient seen at bedside. Patient is much better and denies any SOB  O: BP 111/82   Pulse 97   Temp 98 F (36.7 C) (Oral)   Resp 20   Ht 5' 2 (1.575 m)   Wt 81 kg   SpO2 99%   BMI 32.67 kg/m   General: A&O, NAD HEENT: No sign of trauma, EOM grossly intact Cardiac: RRR, no m/r/g Respiratory: CTAB, normal WOB, no w/c/r GI: Soft, NTTP, non-distended  Extremities: NTTP, no peripheral edema. Neuro: Normal gait, moves all four extremities appropriately. Psych: Appropriate mood and affect   A/P: 53 yo F with PMHx of recently diagnosed HFrEF. Patient is well appearing on exam without increased WOB. Down 3L overnight  - continue to monitor diueresis with I/Os - added daily weights  - consider another dose of IV lasix  as needed  - rest of plan per day team  Lonnie Earnest, MD 08/09/2023, 1:55 AM PGY-1, [facility] Family Medicine Service pager [phone]

## 2023-08-09 NOTE — Hospital Course (Addendum)
 Amber Franco is a 53yo F w/ hx of CHF (EF <20%) that was admitted for CHF exacerbation.   CHF Exacerbation  Pt presented w/ 2 day SOB and increasing LE edema. On presentation, VSS on RA. BNP elevated. Trop negative. Pt diuresed w/ IV 40 mg lasix  with good UOP. At discharge, pt was transitioned to PO lasix  and WOB improved. Pt never required supplemental oxygen while admitted. Pt scheduled for follow-[facility] at [facility] on 08/12/23.    PCP Recommendations 1) Repeat BMP 2) Ensure follow-up with Cardiologist 3) Consider SSRI for further management of mood symptoms from recent life stressors 4) EF improved, please consider adding beta-blocker for additional GDMT

## 2023-08-09 NOTE — Discharge Summary (Addendum)
 Family Medicine Teaching [facility] Discharge Summary Patient name: Amber Franco Medical record number: [id number] Date of birth: 02-03-1971 Age: 53 y.o. Gender: female Date of Admission: 08/08/2023  Date of Discharge: 08/10/2023 Admitting Physician: Winn Ogren, MD  Primary Care Provider: Diona Perkins, MD Consultants: None  Indication for Hospitalization: Shortness of breath   Discharge Diagnoses/Problem List:  Principal Problem for Admission: Acute CHF exacerbation   Other Problems addressed during stay:  Principal Problem:   Acute exacerbation of CHF (congestive heart failure) (HCC)   RH is a 53yo F w/ hx of CHF (EF <20%) that was admitted for CHF exacerbation.   CHF Exacerbation  Pt presented w/ 2 day SOB and increasing LE edema. On presentation, VSS on RA. BNP elevated. Trop negative. Pt diuresed w/ IV 40 mg lasix  with good UOP. At discharge, pt was transitioned to PO lasix  and WOB improved. Pt never required supplemental oxygen while admitted. Pt scheduled for follow-up at [facility] on 08/12/23.    PCP Recommendations 1) Repeat BMP 2) Ensure follow-up with Cardiologist 3) Consider SSRI for further management of mood symptoms from recent life stressors 4) EF improved, please consider adding beta-blocker for additional GDMT   Disposition: Home  Discharge Condition: stable   Discharge Exam:  Vitals:   08/10/23 0400 08/10/23 0749  BP: 99/69 121/79  Pulse: 85 92  Resp: 16 18  Temp: 98.2 F (36.8 C) 98.3 F (36.8 C)  SpO2: 98% 100%   Gen: Age appropriate and NAD  HENT: MMM, no JVD Cardio: RRR, no murmur Pulm: Normal WOB on RA Abd: non-distended and without fluid wave Ext: Without edema    Significant Procedures: N/A  Significant Labs and Imaging:  Recent Labs  Lab 08/08/23 1440 08/09/23 0256  WBC 7.1 8.1  HGB 12.8 13.4  HCT 39.2 40.5  PLT 233 245   Recent Labs  Lab 08/08/23 1440 08/09/23 0256 08/09/23 0302 08/09/23 1811  08/10/23 0239  NA 140 138  --  137 136  K 3.5 3.0*  --  4.1 3.5  CL 104 104  --  104 102  CO2 25 24  --  22 24  GLUCOSE 93 107*  --  131* 109*  BUN 5* 5*  --  7 6  CREATININE 0.84 0.78  --  0.97 0.81  CALCIUM  9.7 9.1  --  9.6 9.2  MG  --   --  1.5*  --  2.0  ALKPHOS  --  33*  --   --   --   AST  --  44*  --   --   --   ALT  --  48*  --   --   --   ALBUMIN  --  3.8  --   --   --      Pertinent Imaging: Chest Xray:  The heart size and mediastinal contours are within normal limits. Both lungs are clear. The visualized skeletal structures are unremarkable. IMPRESSION: No active cardiopulmonary disease.    Results/Tests Pending at Time of Discharge: None  Discharge Medications:  Allergies as of 08/10/2023       Reactions   Strawberry (diagnostic) Anaphylaxis   Tape Other (See Comments)   Leaves marks on skin        Medication List     STOP taking these medications    fluticasone  50 MCG/ACT nasal spray Commonly known as: FLONASE        TAKE these medications    acetaminophen  325 MG  tablet Commonly known as: TYLENOL  Take 2 tablets (650 mg total) by mouth every 6 (six) hours as needed for mild pain (pain score 1-3) or moderate pain (pain score 4-6).   digoxin  0.125 MG tablet Commonly known as: LANOXIN  Take 1 tablet (0.125 mg total) by mouth daily.   Farxiga  10 MG Tabs tablet Generic drug: dapagliflozin  propanediol Take 1 tablet (10 mg total) by mouth daily.   furosemide  40 MG tablet Commonly known as: Lasix  Take 1 tablet (40 mg total) by mouth daily. For any weight gain of 3 pounds or more. What changed:  when to take this reasons to take this   lidocaine  5 % Commonly known as: LIDODERM  Place 1 patch onto the skin daily. Remove & Discard patch within 12 hours or as directed by MD   loratadine  10 MG tablet Commonly known as: CLARITIN  Take 1 tablet (10 mg total) by mouth daily.   losartan  25 MG tablet Commonly known as: COZAAR  Take 0.5 tablets (12.5 mg  total) by mouth at bedtime.   melatonin 5 MG Tabs Take 1 tablet (5 mg total) by mouth at bedtime.   pantoprazole  40 MG tablet Commonly known as: PROTONIX  Take 1 tablet (40 mg total) by mouth daily.   sennosides-docusate sodium  8.6-50 MG tablet Commonly known as: SENOKOT-S Take 1 tablet by mouth as needed for constipation.   spironolactone  25 MG tablet Commonly known as: ALDACTONE  Take 0.5 tablets (12.5 mg total) by mouth daily.        Discharge Instructions: Please refer to Patient Instructions section of EMR for full details.  Patient was counseled important signs and symptoms that should prompt return to medical care, changes in medications, dietary instructions, activity restrictions, and follow up appointments.   Follow-Up Appointments:  Follow-up Information     Joshua Domino, DO Follow up on 08/12/2023.   Specialty: Family Medicine Why: You have an appointment with Dr. Joshua at 2:30pm. Please arrive at least 15 minutes early. Contact information: [address] [place] [address] [phone]                 Theophilus Pagan, MD 08/10/2023, 12:18 PM PGY-3, [facility] Family [facility]

## 2023-08-09 NOTE — ED Provider Notes (Signed)
 [place] EMERGENCY DEPARTMENT AT [facility] Provider Note   CSN: [id number] Arrival date & time: 08/08/23  1353     History  Chief Complaint  Patient presents with   Leg Swelling    Amber Franco is a 53 y.o. female.  The history is provided by the patient and medical records.   53 year old female with history of fibromyalgia, congestive heart failure with last EF <20%, depression, HTN, NICM, presenting to the ED for SOB and leg swelling.  States she was admitted for same last month, had been doing well up until about 2 days ago.  Patient states since that time she has had worsening shortness of breath and some increased lower extremity edema.  She has been taking her Lasix  40 mg daily.  States after taking it yesterday she has not had any significant output and was concerned about that.  Today she has been increasingly more short of breath.  Anytime she tries to get up and do anything she becomes very labored and feels awful.  Dry weight 178 yesterday.  She continues to have some rib pain, sounds like this has been ongoing since prior hospitalization.  She called family practice clinic and was told to come to the ED.  Home Medications Prior to Admission medications   Medication Sig Start Date End Date Taking? Authorizing Provider  acetaminophen  (TYLENOL ) 325 MG tablet Take 2 tablets (650 mg total) by mouth every 6 (six) hours as needed for mild pain (pain score 1-3) or moderate pain (pain score 4-6). 07/11/23   Howell Lunger, DO  dapagliflozin  propanediol (FARXIGA ) 10 MG TABS tablet Take 1 tablet (10 mg total) by mouth daily. 08/07/23   Diona Perkins, MD  digoxin  (LANOXIN ) 0.125 MG tablet Take 1 tablet (0.125 mg total) by mouth daily. 08/07/23   Diona Perkins, MD  fluticasone  (FLONASE ) 50 MCG/ACT nasal spray Place 1 spray into both nostrils daily. 08/05/23   Diona Perkins, MD  furosemide  (LASIX ) 40 MG tablet Take 1 tablet (40 mg total) by mouth daily as needed. For any weight gain of 3  pounds or more. 07/11/23 07/10/24  Howell Lunger, DO  lidocaine  (LIDODERM ) 5 % Place 1 patch onto the skin daily. Remove & Discard patch within 12 hours or as directed by MD 07/24/23   Gabino Boga, MD  loratadine  (CLARITIN ) 10 MG tablet Take 1 tablet (10 mg total) by mouth daily. 07/12/23   Howell Lunger, DO  losartan  (COZAAR ) 25 MG tablet Take 0.5 tablets (12.5 mg total) by mouth at bedtime. 08/07/23   Diona Perkins, MD  melatonin 5 MG TABS Take 1 tablet (5 mg total) by mouth at bedtime. 08/07/23   Diona Perkins, MD  pantoprazole  (PROTONIX ) 40 MG tablet Take 1 tablet (40 mg total) by mouth daily. 08/05/23   Diona Perkins, MD  sennosides-docusate sodium  (SENOKOT-S) 8.6-50 MG tablet Take 1 tablet by mouth as needed for constipation. 07/17/23   Zenaida Morene PARAS, MD  spironolactone  (ALDACTONE ) 25 MG tablet Take 0.5 tablets (12.5 mg total) by mouth daily. 08/07/23   Diona Perkins, MD      Allergies    Strawberry (diagnostic) and Tape    Review of Systems   Review of Systems  Respiratory:  Positive for shortness of breath.   Cardiovascular:  Positive for chest pain (ribs).  All other systems reviewed and are negative.   Physical Exam Updated Vital Signs BP 116/89 (BP Location: Right Arm)   Pulse (!) 9   Temp 98.2 F (36.8 C) (  Oral)   Resp 18   SpO2 99%   Physical Exam Vitals and nursing note reviewed.  Constitutional:      Appearance: She is well-developed.  HENT:     Head: Normocephalic and atraumatic.  Eyes:     Conjunctiva/sclera: Conjunctivae normal.     Pupils: Pupils are equal, round, and reactive to light.  Cardiovascular:     Rate and Rhythm: Normal rate and regular rhythm.     Heart sounds: Normal heart sounds.  Pulmonary:     Effort: Pulmonary effort is normal.     Breath sounds: Normal breath sounds.     Comments: Lungs grossly clear, becomes winded with minor activity (even just taking off jacket, grabbing purse, etc) Abdominal:     General: Bowel sounds are normal.      Palpations: Abdomen is soft.  Musculoskeletal:        General: Normal range of motion.     Cervical back: Normal range of motion.     Comments: Trace edema bilateral ankles  Skin:    General: Skin is warm and dry.  Neurological:     Mental Status: She is alert and oriented to person, place, and time.     ED Results / Procedures / Treatments   Labs (all labs ordered are listed, but only abnormal results are displayed) Labs Reviewed  BASIC METABOLIC PANEL - Abnormal; Notable for the following components:      Result Value   BUN 5 (*)    All other components within normal limits  BRAIN NATRIURETIC PEPTIDE - Abnormal; Notable for the following components:   B Natriuretic Peptide 303.3 (*)    All other components within normal limits  CBC  URINALYSIS, ROUTINE W REFLEX MICROSCOPIC  TROPONIN I (HIGH SENSITIVITY)  TROPONIN I (HIGH SENSITIVITY)    EKG EKG Interpretation Date/Time:  Friday August 08 2023 15:10:52 EST Ventricular Rate:  91 PR Interval:  216 QRS Duration:  86 QT Interval:  362 QTC Calculation: 445 R Axis:   28  Text Interpretation: Sinus rhythm with 1st degree A-V block Cannot rule out Anterior infarct , age undetermined T wave abnormality, consider inferior ischemia Abnormal ECG When compared with ECG of 24-Jul-2023 11:53, PREVIOUS ECG IS PRESENT No acute changes Confirmed by Trine Likes [phone]) on 08/08/2023 11:32:43 PM  Radiology DG Chest 2 View Result Date: 08/08/2023 CLINICAL DATA:  Shortness of breath EXAM: CHEST - 2 VIEW COMPARISON:  Chest x-ray 07/06/2023 FINDINGS: The heart size and mediastinal contours are within normal limits. Both lungs are clear. The visualized skeletal structures are unremarkable. IMPRESSION: No active cardiopulmonary disease. Electronically Signed   By: Greig Pique M.D.   On: 08/08/2023 16:46    Procedures Procedures    Medications Ordered in ED Medications  digoxin  (LANOXIN ) tablet 0.125 mg (has no administration in time  range)  furosemide  (LASIX ) injection 40 mg (has no administration in time range)  enoxaparin  (LOVENOX ) injection 40 mg (has no administration in time range)    ED Course/ Medical Decision Making/ A&P                                 Medical Decision Making Amount and/or Complexity of Data Reviewed Labs: ordered. Radiology: ordered and independent interpretation performed. ECG/medicine tests: ordered and independent interpretation performed.  Risk Prescription drug management. Decision regarding hospitalization.   53 year old female presenting to the ED with worsening leg swelling and shortness of breath.  Newly diagnosed heart failure, last EF <20%.  States after Lasix  yesterday she has had minimal urine output.  Called family practice clinic and was sent to the ED for further evaluation.  She is afebrile and nontoxic.  She is hemodynamically stable.  She does have some trace edema at the ankles.  Her lungs are clear but she gets incredibly winded just with simple tasks such as picking up her purse or taking off her jacket.  States she is hardly able to get up and move around because of this.  Does have some nighttime orthopnea as well.  Labs as above--no leukocytosis or significant electrolyte derangement.  BNP 303.  CXR without pulmonary edema.    Patient does not appear significantly fluid overloaded on my exam but she is quite symptomatic.  Still undergoing work-up with cardiology for cause of her CHF.  She is quite anxious given worsening symptoms.  Given dose of IV lasix  here.  Discussed with family practice service-- they will admit for ongoing care.  Final Clinical Impression(s) / ED Diagnoses Final diagnoses:  Acute on chronic congestive heart failure, unspecified heart failure type [facility])    Rx / DC Orders ED Discharge Orders     None         Jarold Olam HERO, PA-C 08/09/23 0129    Trine Raynell Moder, MD 08/10/23 [phone]

## 2023-08-09 NOTE — Discharge Instructions (Addendum)
 Dear Amber Franco,  It was our pleasure to take care of you in the hospital. You were admitted for volume overload related to your congestive heart failure. We treated you with IV Lasix . We are going to send you home on daily Lasix  to keep this fluid from reaccumulating. We will see you at your follow-up visit on Tuesday 2/11.    DOCTOR'S APPOINTMENT   Future Appointments  Date Time Provider Department Center  08/12/2023  2:30 PM ACCESS TO CARE POOL [facility]  09/03/2023  7:20 AM [facility] PET CT 1 [facility] [place]  09/09/2023 10:20 AM Zenaida Morene PARAS, MD [facility] None  09/09/2023  1:30 PM Christia Budds, MD [facility]     Take care and be well!  Family Medicine Teaching Service Inpatient Team [place]  [facility]  [address] [place], [place] [address] [phone]

## 2023-08-09 NOTE — Progress Notes (Signed)
 Echocardiogram 2D Echocardiogram has been performed.  Dakotah Orrego N Rabia Argote,RDCS 08/09/2023, 2:09 PM

## 2023-08-09 NOTE — H&P (Addendum)
 Hospital Admission History and Physical Service Pager: [phone]  Patient name: Amber Franco Medical record number: [id number] Date of Birth: January 02, 1971 Age: 53 y.o. Gender: female  Primary Care Provider: Diona Perkins, MD Consultants: None Code Status:Full Code Preferred Emergency Contact:  Contact Information     Name Relation Home Work Mobile   Amber Franco,Amber Franco Significant other [phone]  [phone]   Amber Franco Brigham   [phone]      Other Contacts   None on File      Chief Complaint: shortness of breath   Assessment and Plan: Amber Franco is a 53 y.o. female with PMHx of recently diagnosed HFrEF (cardiology believes it might be takotsubo cardiomyopathy with EF ~20% (Echo 07/06/2023) and HTN presenting with shortness of breath for the last 2-3 days . Differential for this patient's presentation of this includes CHF exacerbation, infection, less likely ACS or PE. Given patients increased SOB in the last few days, increase in weight and elevated BNP, suspect this is likely CHF exacerbation. Patient denying cough or other sick symptoms, but will add on RVP to rule out common infections. Did consider ACS given patient describing some chest pain, however this pain seems to be chronic and reproducible at left lower breast. Reassuringly, EKG unchanged from prior and trops negative and flat. Considered PE, patient without unilateral swelling or tachycardia with a Well's Score of 0.   Will treat for CHF exacerbation. Patient took one dose of 40mg  PO lasix  without much improvement. Will give one dose of 40 mg IV lasix  and monitor I/Os. Given patient did not have much output, will bladder scan to rule out retention. Patient recently admitted and diagnosed with new onset HF, perhaps in setting of takotsubo cardiomyopathy, will order Echo to rule out worsening of HF.   Assessment & Plan Acute exacerbation of CHF (congestive heart failure) (HCC) Patient presented to ED for acute onset  of shortness of breath and increase in lower extremity swelling for the past two days. Patient took 1 40mg  lasix  dose yesterday but did not notice significant output.  - Admit to FMTS, Attending: Dr. Rosalynn - 40 mg IV lasix  x1  - Strict I/Os - Echocardiogram - Bladder Scan to rule out retention  - Vitals per floor - AM CMP, CBC     Chronic and Stable Conditions: CHF: continue digoxin , will hold spironolactone  HTN: holding losartan  in setting of normal BP   FEN/GI: Regular diet VTE Prophylaxis: Lovenox    Disposition: Med Surg   History of Present Illness:  Amber Franco is a 53 y.o. female presenting with shortness of breath. Patient reports taking water pill yesterday but didn't pee much. She takes this as needed. Has a couple days of SOB and bilateral lower extremity edema. Also thinks she has gained some weight over the past week. Also has some L sided chest pain that has been chronic but worse with deep breathing. Patient is worried about missing her medications today. She also lives two hours away and is worried about being seen in clinic Monday. Patient denies sick symptoms.    In the ED, vitals stable without hypoxia. BNP elevated to 303. BMP and CBC wnl. Trops flat and negative. Chest xray without active cardiopulmonary disease.   Review Of Systems: Per HPI  Pertinent Past Medical History: Systolic CHF with last EF <20% Depression HTN Sickle cell trait    Pertinent Past Surgical History: Tubal ligation  Right heart cath 07/08/2023  Remainder reviewed in history tab.   Pertinent Social History: Tobacco  use: former, quit in 90s Alcohol use: no Other Substance use: no  Pertinent Family History: Father: HTN, stroke Mom: cancer  Remainder reviewed in history tab.   Important Outpatient Medications: Digoxin  0.125 mg Losartan  25 mg Spironolactone  25 mg Farxiga  10 mg daily Protonix  40 mg daily Lasix  40 mg as needed for weight gain > 3 lbs  Remainder reviewed in  medication history.   Objective: BP 116/89 (BP Location: Right Arm)   Pulse (!) 9   Temp 98.2 F (36.8 C) (Oral)   Resp 18   SpO2 99%  Exam: General: mildly anxious, NAD  HEENT: atraumatic, normocephalic, MMM Cardiovascular: RRR, no m/r/g Respiratory: CTAB, NWOB on RA Gastrointestinal: soft, NTND MSK: trace bilateral lower extremity edema, mildly TTP at left rib cage Derm: no rashes noted, SK on left side of face  Neuro: A&Ox4, no focal deficits Psych: anxious mood, normal affect   Labs:  CBC BMET  Recent Labs  Lab 08/08/23 1440  WBC 7.1  HGB 12.8  HCT 39.2  PLT 233   Recent Labs  Lab 08/08/23 1440  NA 140  K 3.5  CL 104  CO2 25  BUN 5*  CREATININE 0.84  GLUCOSE 93  CALCIUM  9.7    Pertinent additional labs  Trops: 5 > 7 BNP: 303.3  EKG: My own interpretation (not copied from electronic read)  NSR ~ 90 bpm with t wave inversion in lead aVF    Imaging Studies Performed: CXR:  The heart size and mediastinal contours are within normal limits. Both lungs are clear. The visualized skeletal structures are unremarkable. IMPRESSION: No active cardiopulmonary disease.    Lonnie Mahnoor, MD 08/09/2023, 1:10 AM PGY-1, [facility] Family Medicine  FPTS Intern pager: [phone], text pages welcome Secure chat group [facility] Teaching Service

## 2023-08-10 DIAGNOSIS — I5023 Acute on chronic systolic (congestive) heart failure: Secondary | ICD-10-CM | POA: Diagnosis not present

## 2023-08-10 DIAGNOSIS — I509 Heart failure, unspecified: Secondary | ICD-10-CM | POA: Diagnosis not present

## 2023-08-10 LAB — BASIC METABOLIC PANEL
Anion gap: 10 (ref 5–15)
BUN: 6 mg/dL (ref 6–20)
CO2: 24 mmol/L (ref 22–32)
Calcium: 9.2 mg/dL (ref 8.9–10.3)
Chloride: 102 mmol/L (ref 98–111)
Creatinine, Ser: 0.81 mg/dL (ref 0.44–1.00)
GFR, Estimated: >60 mL/min (ref >60)
Glucose, Bld: 109 mg/dL — ABNORMAL HIGH (ref 70–99)
Potassium: 3.5 mmol/L (ref 3.5–5.1)
Sodium: 136 mmol/L (ref 135–145)

## 2023-08-10 LAB — MAGNESIUM: Magnesium: 2 mg/dL (ref 1.7–2.4)

## 2023-08-10 MED ORDER — FUROSEMIDE 40 MG PO TABS: 40.0000 mg | ORAL_TABLET | Freq: Every day | ORAL | Status: DC

## 2023-08-10 MED ORDER — POTASSIUM CHLORIDE 20 MEQ PO PACK
40.0000 meq | PACK | ORAL | Status: AC
Start: 2023-08-10 — End: 2023-08-10
  Administered 2023-08-10 (×2): 40 meq via ORAL
  Filled 2023-08-10 (×2): qty 2

## 2023-08-10 NOTE — Plan of Care (Addendum)
Discharge delayed due to transportation.

## 2023-08-10 NOTE — Care Management (Addendum)
 Received approval from [facility] leadership for taxi Interior and spatial designer provided

## 2023-08-12 ENCOUNTER — Ambulatory Visit: Payer: Self-pay

## 2023-08-13 ENCOUNTER — Ambulatory Visit: Payer: Medicaid Other | Admitting: Student

## 2023-08-13 ENCOUNTER — Encounter (HOSPITAL_COMMUNITY): Payer: Self-pay

## 2023-08-13 ENCOUNTER — Other Ambulatory Visit (HOSPITAL_COMMUNITY): Payer: Self-pay

## 2023-08-13 VITALS — BP 102/68 | HR 106 | Wt 180.2 lb

## 2023-08-13 DIAGNOSIS — R0789 Other chest pain: Secondary | ICD-10-CM | POA: Diagnosis not present

## 2023-08-13 DIAGNOSIS — I5022 Chronic systolic (congestive) heart failure: Secondary | ICD-10-CM | POA: Diagnosis not present

## 2023-08-13 MED ORDER — METOPROLOL SUCCINATE ER 25 MG PO TB24
12.5000 mg | ORAL_TABLET | Freq: Every day | ORAL | 3 refills | Status: DC
  Filled 2023-08-13: qty 45, 90d supply, fill #0

## 2023-08-13 NOTE — Assessment & Plan Note (Addendum)
With improved EF will start Toprol XL 12.5 mg daily. HR in 100s today with stable BP.  Patient is above dry weight at 180lb today, instructed to take 2 40 mg Lasix for the next 3 days for diuresis, then drop back down to 40 mg once daily. Patient to check daily weights with goal of 177 lb.  Recheck BMP today to monitor electrolytes  Discussed attending cardiology appointment in March

## 2023-08-13 NOTE — Progress Notes (Addendum)
    SUBJECTIVE:   CHIEF COMPLAINT / HPI:   Amber Franco is a 53 y.o. female  presenting for hospital follow up.  Patient was admitted for congestive heart failure exacerbation.  She has a diagnosis of HFrEF with an EF of <20% on 07/2023.  Currently NYHA class II  Echocardiogram was repeated on 08/09/2023 which showed an improved LVEF to 25 to 30%.  Patient has an appointment scheduled with cardiology for follow-up in March.  She is currently on Farxiga 10 mg, losartan 25 mg, Aldactone 12.5 mg, digoxin 0.125 mg daily.  She is not currently on a beta-blocker.    When she was discharged to the hospital she was prescribed 40 mg Lasix daily. Discharge weight 177 lb.   08/13/23:  Since being home she has noticed increased swelling in her feet and trouble lying flat in bed. She has been taking all of her medications as prescribed. Per patient, the 40 mg Lasix does not seem to be pulling enough fluid off.   Chest wall pain 2/2 to fall:  Trying OTC Tylenol without significant relief. No trouble with breathing.   PERTINENT  PMH / PSH: Reviewed and updated   OBJECTIVE:   BP 102/68   Pulse (!) 106   Wt 180 lb 3.2 oz (81.7 kg)   SpO2 98%   BMI 32.96 kg/m   Well-appearing, no acute distress Cardio: Regular rate, regular rhythm, no murmurs on exam. Pulm: Clear, no wheezing, mild crackles in the lung bases. No increased work of breathing Abdominal: bowel sounds present, soft, non-tender, non-distended Extremities: trace peripheral edema  Neuro: alert and oriented x3, speech normal in content, no facial asymmetry, strength intact and equal bilaterally in UE and LE, pupils equal and reactive to light.  Psych:  Cognition and judgment appear intact. Alert, communicative  and cooperative with normal attention span and concentration. No apparent delusions, illusions, hallucinations      08/07/2023   11:10 AM 05/12/2017    3:00 PM 04/14/2017    1:14 PM  PHQ9 SCORE ONLY  PHQ-9 Total Score 20 0 0       ASSESSMENT/PLAN:   Chronic systolic heart failure (HCC) With improved EF will start Toprol XL 12.5 mg daily. HR in 100s today with stable BP.  Patient is above dry weight at 180lb today, instructed to take 2 40 mg Lasix for the next 3 days for diuresis, then drop back down to 40 mg once daily. Patient to check daily weights with goal of 177 lb.  Recheck BMP today to monitor electrolytes  Discussed attending cardiology appointment in March   Chest wall pain 2/2 to fall, reproducible on exam. Discussed topical medications in AVS     Glendale Chard, DO [facility] Center

## 2023-08-13 NOTE — Patient Instructions (Addendum)
It was great to see you today!   For your heart failure hide adding a new medicine called metoprolol/Toprol-XL 12.5 mg this is 1/2 tablet that you should take once daily at bedtime.  Your goal weight is 177 pounds.  If you are having increased lower extremity edema or having trouble leg that.  You could take 2 tablets of your Lasix 40 mg for 3 days and then go back down to 1 tablet.  Please avoid adding salt to your meals as this can worsen the amount of fluid irritate.  Future Appointments  Date Time Provider Department Center  09/03/2023  7:20 AM [facility] PET CT 1 [facility] [place]  09/09/2023 10:20 AM Romie Minus, MD [facility] None  09/16/2023 10:10 AM Levin Erp, MD [facility]    Please arrive 15 minutes before your appointment to ensure smooth check in process.    Please call the clinic at [phone] if your symptoms worsen or you have any concerns.  Thank you for allowing me to participate in your care, Dr. Glendale Chard [facility]    For your pain you can try several over the counter topical medications. Topical medications are a great option to treat pain as you can place the medication right where you are hurting and they have less side effects.   Voltaren Gel/Diclofenac Gel: this is an anti-inflammatory cream (same ingredient as Motrin/Ibuprofen/Aleve) can be applied up to 4 times per day.     Lidocaine patch: can be applied for 12 hours and after needs a 12 hour break to continue to be effective. Works by using numbing medication to help with pain relief. Can be found at any pharmacy over the counter or on [email or web address]    Capsaicin cream: made from the same ingredient that makes hot peppers spicy, this works by numbing up the area of pain. Please wash your hands and do not touch your eyes after applying the cream as active ingredient can hurt your eyes.

## 2023-08-13 NOTE — Assessment & Plan Note (Signed)
2/2 to fall, reproducible on exam. Discussed topical medications in AVS

## 2023-08-14 ENCOUNTER — Encounter: Payer: Self-pay | Admitting: Student

## 2023-08-14 LAB — BASIC METABOLIC PANEL
BUN/Creatinine Ratio: 10 (ref 9–23)
BUN: 8 mg/dL (ref 6–24)
CO2: 23 mmol/L (ref 20–29)
Calcium: 9.4 mg/dL (ref 8.7–10.2)
Chloride: 99 mmol/L (ref 96–106)
Creatinine, Ser: 0.79 mg/dL (ref 0.57–1.00)
Glucose: 100 mg/dL — ABNORMAL HIGH (ref 70–99)
Potassium: 4.6 mmol/L (ref 3.5–5.2)
Sodium: 138 mmol/L (ref 134–144)
eGFR: 90 mL/min/{1.73_m2} (ref >59)

## 2023-08-21 ENCOUNTER — Other Ambulatory Visit: Payer: Self-pay

## 2023-08-25 ENCOUNTER — Telehealth (HOSPITAL_COMMUNITY): Payer: Self-pay

## 2023-09-01 ENCOUNTER — Telehealth (HOSPITAL_COMMUNITY): Payer: Self-pay | Admitting: *Deleted

## 2023-09-01 NOTE — Telephone Encounter (Signed)
 Attempted to call patient regarding upcoming cardiac CT appointment. Left message on voicemail with name and callback number  Larey Brick RN Navigator Cardiac Imaging [facility] Heart [facility] Vascular Services [phone] Office [phone] Cell

## 2023-09-02 ENCOUNTER — Telehealth (HOSPITAL_COMMUNITY): Payer: Self-pay | Admitting: *Deleted

## 2023-09-02 NOTE — Telephone Encounter (Signed)
 Calling patient back after we had discussed her diet prep. She had finished some oatmeal at 8:25 am today. I consulted Dr. Flora Lipps who advises that we reschedule her sarcoid. Left a message on patient's VM about re-scheduling.  Sarcoid PET scheduler also messaged.  Larey Brick RN Navigator Cardiac Imaging [facility] and Vascular Services [phone] Office [phone] Cell

## 2023-09-03 ENCOUNTER — Encounter (HOSPITAL_COMMUNITY): Admission: RE | Admit: 2023-09-03 | Payer: Medicaid Other | Source: Ambulatory Visit

## 2023-09-09 ENCOUNTER — Other Ambulatory Visit (HOSPITAL_COMMUNITY): Payer: Self-pay

## 2023-09-09 ENCOUNTER — Other Ambulatory Visit: Payer: Self-pay | Admitting: Family Medicine

## 2023-09-09 ENCOUNTER — Encounter (HOSPITAL_COMMUNITY): Payer: Medicaid Other | Admitting: Cardiology

## 2023-09-09 ENCOUNTER — Ambulatory Visit (HOSPITAL_COMMUNITY)
Admission: RE | Admit: 2023-09-09 | Discharge: 2023-09-09 | Disposition: A | Discharge status: Home / Self Care | Payer: Medicaid Other | Source: Ambulatory Visit | Attending: Cardiology | Admitting: Cardiology

## 2023-09-09 ENCOUNTER — Other Ambulatory Visit: Payer: Self-pay

## 2023-09-09 ENCOUNTER — Encounter (HOSPITAL_COMMUNITY): Payer: Self-pay | Admitting: Cardiology

## 2023-09-09 ENCOUNTER — Ambulatory Visit: Payer: Self-pay | Admitting: Student

## 2023-09-09 VITALS — BP 100/68 | HR 89 | Wt 174.6 lb

## 2023-09-09 DIAGNOSIS — I110 Hypertensive heart disease with heart failure: Secondary | ICD-10-CM | POA: Insufficient documentation

## 2023-09-09 DIAGNOSIS — Z79899 Other long term (current) drug therapy: Secondary | ICD-10-CM | POA: Insufficient documentation

## 2023-09-09 DIAGNOSIS — R079 Chest pain, unspecified: Secondary | ICD-10-CM | POA: Insufficient documentation

## 2023-09-09 DIAGNOSIS — I5022 Chronic systolic (congestive) heart failure: Secondary | ICD-10-CM | POA: Diagnosis not present

## 2023-09-09 LAB — C-REACTIVE PROTEIN: CRP: 1.4 mg/dL — ABNORMAL HIGH (ref <1.0)

## 2023-09-09 LAB — SEDIMENTATION RATE: Sed Rate: 17 mm/h (ref 0–22)

## 2023-09-09 MED ORDER — LOSARTAN POTASSIUM 25 MG PO TABS
12.5000 mg | ORAL_TABLET | Freq: Every day | ORAL | 0 refills | Status: DC
  Filled 2023-09-09: qty 30, 60d supply, fill #0

## 2023-09-09 MED ORDER — PANTOPRAZOLE SODIUM 40 MG PO TBEC
40.0000 mg | DELAYED_RELEASE_TABLET | Freq: Every day | ORAL | 0 refills | Status: DC
  Filled 2023-09-09 (×2): qty 30, 30d supply, fill #0

## 2023-09-09 MED ORDER — FUROSEMIDE 40 MG PO TABS: 40.0000 mg | ORAL_TABLET | Freq: Every day | ORAL | Status: DC

## 2023-09-09 MED ORDER — DIGOXIN 125 MCG PO TABS
0.1250 mg | ORAL_TABLET | Freq: Every day | ORAL | 0 refills | Status: DC
  Filled 2023-09-09 (×2): qty 30, 30d supply, fill #0

## 2023-09-09 MED ORDER — METOPROLOL SUCCINATE ER 25 MG PO TB24
25.0000 mg | ORAL_TABLET | Freq: Every day | ORAL | 3 refills | Status: DC
  Filled 2023-09-09 – 2023-10-14 (×4): qty 90, 90d supply, fill #0

## 2023-09-09 MED ORDER — SPIRONOLACTONE 25 MG PO TABS
25.0000 mg | ORAL_TABLET | Freq: Every day | ORAL | 3 refills | Status: DC
  Filled 2023-09-09 (×2): qty 90, 90d supply, fill #0
  Filled 2023-11-17: qty 90, 90d supply, fill #1

## 2023-09-09 MED ORDER — DAPAGLIFLOZIN PROPANEDIOL 10 MG PO TABS
10.0000 mg | ORAL_TABLET | Freq: Every day | ORAL | 0 refills | Status: DC
  Filled 2023-09-09 – 2023-10-06 (×2): qty 30, 30d supply, fill #0

## 2023-09-09 MED ORDER — MELATONIN 5 MG PO TABS
5.0000 mg | ORAL_TABLET | Freq: Every day | ORAL | 0 refills | Status: DC
  Filled 2023-09-09 – 2023-10-06 (×2): qty 30, 30d supply, fill #0

## 2023-09-09 NOTE — Progress Notes (Signed)
 ADVANCED HEART FAILURE FOLLOW UP CLINIC NOTE  Referring Physician: Ivery Quale, MD  Primary Care: Ivery Quale, MD Primary Cardiologist:  HPI: Amber Franco is a 53 y.o. female who presents for follow up of chronic systolic heart failure.      Patient presented with chest pain, orthopnea, and shortness of breath after recent deaths of her husband and father. Symptomatic with echocardiogram with EF of less than 20% with global LV hypokinesis, LV apex swirling and severely reduced RV function as well as moderate MVR. Underwent IV diuresis, GDMT, L/RHC (no ischemic disease), cardiac MR (Mild LV dilatation w/ severe systolic dysfunction (EF 25%), Normal RV w/ moderate systolic dysfunction (EF 37%), multifocal LGE possible sarcoidosis vs DCM vs prior myocarditis (recommended FDG-PET), moderate MR (22%), small pericardial effusion ). She was stable and started on spironolactone, farxiga, losartan, digoxin and transitioned to oral lasix.     SUBJECTIVE:  Patient reports overall doing well. She went biking for about 7 hours this past weekend and notes that her chest and her legs are sore.  She has otherwise been doing well.  Her dizzy spells have improved.  She has been tolerating her medications without issues.  She has been taking her Lasix daily.  She does report some occasional sharp chest pain that is somewhat positional and feels like it hurts with every heartbeat.  Not reproducible on exam.  No increased with exertion or stress.  PMH, current medications, allergies, social history, and family history reviewed in epic.  PHYSICAL EXAM: Vitals:   09/09/23 1004  BP: 100/68  Pulse: 89  SpO2: 97%    GENERAL: Well nourished and in no apparent distress at rest.  HEENT: The mucous membranes are pink and moist.   PULM:  Normal work of breathing, clear to auscultation bilaterally. Respirations are unlabored.  CARDIAC:  JVP: not elevated         Normal rate with regular rhythm. No murmurs, rubs  or gallops.  No lower extremity edema.  ABDOMEN: Soft, non-tender, non-distended. NEUROLOGIC: Patient is oriented x3 with no focal or lateralizing neurologic deficits.  PSYCH: Patients affect is appropriate, there is no evidence of anxiety or depression.  SKIN: Warm and dry; no lesions or wounds. Warm and well perfused extremities.  DATA REVIEW  ECG: 07/2023: Sinus tachy, LAE  ECHO: 07/2023: LVEF 20-25%, RV function severely reduced as well  CATH: 07/2023: Normal right dominant coronary arteriography, RA 8, PA 48/32 (39), PCWP 25, Fick CO 3.55L/min, Fick CI 1.95L/min/m2  CMR:  cardiac MR (Mild LV dilatation w/ severe systolic dysfunction (EF 25%), Normal RV w/ moderate systolic dysfunction (EF 37%), multifocal LGE possible sarcoidosis vs DCM vs prior myocarditis (recommended FDG-PET), moderate MR (22%), small pericardial effusion ).   Heart failure review: - Classification: Heart failure with reduced EF - Etiology: Work up ongoing - NYHA Class: II - Volume status: Euvolemic - ACEi/ARB/ARNI: Currently up-titrating - Aldosterone antagonist: Currently up-titrating - Beta-blocker: Currently up-titrating - Digoxin: Maximally tolerated dose - Hydralazine/Nitrates: Intolerant - SGLT2i: Maximally tolerated dose - GLP-1: Not a candidate - Advanced therapies: Not needed at this time - ICD: Currently uptitrating GDMT  ASSESSMENT & PLAN:  Chronic systolic heart failure: Workup ongoing, negative LHC, MRI consistent with sarcoid versus myocarditis. Recent traumatic events also suggestive of takotsubo, but degree of LGE on MRI rare with stress induced cardiomyopathy.  - PET sarcoid ordered, missed initial appointment due to eating oatmeal - Continue digoxin 0.125mg  daily - Continue farxiga 10mg  daily, furosemide 40mg  prn, losartan 12.5mg   daily -Increase spironolactone to 25 mg, metoprolol to 25 mg -Cardiac rehab  HTN -BP soft but stable  Chest pain: Sharp chest pain, somewhat positional,  not related to exertion. Could consider pericarditis given history, will obtain EKG and inflammatory markers. Can treat if elevated. - EKG, CRP, ESR  I spent 35 minutes caring for this patient today including face to face time, ordering and reviewing labs, reviewing records from recent hospitalization, seeing the patient, documenting in the record, and arranging follow ups.'   Clearnce Hasten, MD Advanced Heart Failure Mechanical Circulatory Support 09/09/23

## 2023-09-09 NOTE — Patient Instructions (Signed)
 INCREASE Spironolactone to 25 mg   INCREASE Metoprolol to 25 mg daily.  Labs done today, your results will be available in MyChart, we will contact you for abnormal readings.  Your physician recommends that you schedule a follow-up appointment in: 2 months ( May) ** PLEASE CALL THE OFFICE IN 3 WEEKS TO ARRANGE YOUR FOLLOW UP APPOINTMENT.**  If you have any questions or concerns before your next appointment please send [place] a message through [place] or call our office at [phone].    TO LEAVE A MESSAGE FOR THE NURSE SELECT OPTION 2, PLEASE LEAVE A MESSAGE INCLUDING: YOUR NAME DATE OF BIRTH CALL BACK NUMBER REASON FOR CALL**this is important as we prioritize the call backs  YOU WILL RECEIVE A CALL BACK THE SAME DAY AS LONG AS YOU CALL BEFORE 4:00 PM  At the [facility], you and your health needs are our priority. As part of our continuing mission to provide you with exceptional heart care, we have created designated Provider Care Teams. These Care Teams include your primary Cardiologist (physician) and Advanced Practice Providers (APPs- Physician Assistants and Nurse Practitioners) who all work together to provide you with the care you need, when you need it.   You may see any of the following providers on your designated Care Team at your next follow up: Dr Arvilla Meres Dr Marca Ancona Dr. Dorthula Nettles Dr. Clearnce Hasten Amy Filbert Schilder, NP Robbie Lis, [facility] [place], [place] Brynda Peon, NP [place] Lee, NP Clarisa Kindred, NP Karle Plumber, PharmD Enos Fling, PharmD   Please be sure to bring in all your medications bottles to every appointment.    Thank you for choosing [facility]

## 2023-09-09 NOTE — Telephone Encounter (Signed)
 Chart reviewed. Rx refilled.

## 2023-09-09 NOTE — Telephone Encounter (Signed)
 Patient presents to clinic for refills.   She is also requesting refills on Losartan, melatonin, farixga and furosemide.   Pended to encounter.   Veronda Prude, RN

## 2023-09-10 ENCOUNTER — Encounter (HOSPITAL_COMMUNITY): Payer: Medicaid Other | Admitting: Cardiology

## 2023-09-10 ENCOUNTER — Other Ambulatory Visit: Payer: Self-pay

## 2023-09-12 ENCOUNTER — Encounter (HOSPITAL_COMMUNITY): Payer: Self-pay | Admitting: Cardiology

## 2023-09-16 ENCOUNTER — Encounter: Payer: Self-pay | Admitting: Student

## 2023-09-16 ENCOUNTER — Ambulatory Visit: Payer: Self-pay | Admitting: Student

## 2023-09-16 VITALS — BP 109/80 | HR 85

## 2023-09-16 DIAGNOSIS — R0789 Other chest pain: Secondary | ICD-10-CM

## 2023-09-16 DIAGNOSIS — F32A Depression, unspecified: Secondary | ICD-10-CM

## 2023-09-16 NOTE — Assessment & Plan Note (Signed)
 Left-sided chest wall pain, does have fullness under her left axilla, will obtain mammogram for evaluation.  Also has significant shoulder mobility issues and pain with range of motion and empty can -possible rotator cuff injury. -Diagnostic mammogram STAT, left axilla [place] STAT -Sports medicine number provided, patient instructed for evaluation for possible ultrasound -ED/return precautions discussed

## 2023-09-16 NOTE — Progress Notes (Signed)
 SUBJECTIVE:   CHIEF COMPLAINT / HPI: Chest discomfort  Chest discomfort/Shoulder Pain Recently seen by Dr. Madaline Guthrie heart failure team for sharp chest pain somewhat positional related to exertion considered pericarditis obtain EKG and inflammatory markers which were benign.  Has been experiencing chest pain that radiates to the shoulder. The pain has been ongoing for three weeks and has worsened recently. She denies any associated fevers or vomiting. Also reports a swelling in the left underarm, which has been present for the same duration as the chest pain. The patient has a history of a mammogram, but it was performed over a year ago in [place] and she is unsure of where it was performed  The patient also mentions shoulder pain, which she attributes to a fall down the stairs in December. The pain is worse on the left side and is associated with difficulty raising the arm.  Depression/Grief The patient is also dealing with significant emotional distress. She lost her husband and father in December of the previous year and is approaching the anniversary of her husband's birthday next Week. She reports feelings of depression and has had thoughts of being better off dead, but denies any active suicidal ideation or plans.  She got rid of her gun and gave it to her neighbor.  She has sought help from a therapist and has an upcoming appointment. The patient also reports increased use of marijuana to cope with her emotional distress.     09/16/2023   10:32 AM 08/07/2023   11:10 AM 05/12/2017    3:00 PM 04/14/2017    1:14 PM 01/13/2017    1:27 PM  Depression screen PHQ 2/9  Decreased Interest 2 3 0 0 1  Down, Depressed, Hopeless 1 3 0 0 1  PHQ - 2 Score 3 6 0 0 2  Altered sleeping 3 3   3   Tired, decreased energy 3 2   3   Change in appetite 2 3   0  Feeling bad or failure about yourself  3 3   0  Trouble concentrating 3 3   0  Moving slowly or fidgety/restless 0 0   0  Suicidal thoughts 2  0   0  PHQ-9 Score 19 20   8   Difficult doing work/chores Very difficult    Somewhat difficult     PERTINENT  PMH / PSH: Nonischemic cardiomyopathy,, chest wall pain  OBJECTIVE:   BP 109/80   Pulse 85   SpO2 95%   General: Well appearing, NAD, awake, alert, responsive to questions Psych: Mood slightly anxious, pleasant, does not appear to be responding to any internal stimuli Head: Normocephalic atraumatic Respiratory: chest rises symmetrically,  no increased work of breathing Chest/Breast: Moderate tenderness to palpation on the left axilla with some fullness in comparison to right, no discrete nodules or masses palpated,, no erythema or fluctuance, no cervical adenopathy Shoulder: Inspection reveals no obvious deformity, atrophy, or asymmetry b/l. No swelling Tenderness to palpation over [facility] joint  Decreased range of motion with abduction, painful arc  NV intact distally b/l Special Tests:  - Impingement: Positive empty can sign - Supraspinatous: Positive empty can - Painful arc  ASSESSMENT/PLAN:   Assessment & Plan Chest wall pain Left-sided chest wall pain, does have fullness under her left axilla, will obtain mammogram for evaluation.  Also has significant shoulder mobility issues and pain with range of motion and empty can -possible rotator cuff injury. -Diagnostic mammogram STAT, left axilla [place] STAT -Sports medicine number provided,  patient instructed for evaluation for possible ultrasound -ED/return precautions discussed Depression, unspecified depression type Depression exacerbated by late husband's birthday anniversary. Increased cannabis use noted. No active suicidal ideation. Engaged in therapy, declines medication. - Continue therapy sessions - AVS resources provided   Patient did mention vaginal bump however discussed that we would focus on above 2 issues today  Levin Erp, MD [facility]

## 2023-09-16 NOTE — Patient Instructions (Addendum)
 It was great to see you! Thank you for allowing me to participate in your care!   Our plans for today:  - I am ordering a breast mammogram - we will get this scheduled for you - If shoulder related will send to sports medicine [phone]  If you are feeling suicidal or depression symptoms worsen please immediately go to:   If you are thinking about harming yourself or having thoughts of suicide, or if you know someone who is, seek help right away. If you are in crisis, make sure you are not left alone.  If someone else is in crisis, make sure he/she/they is not left alone  Call 988 OR 1-800-273-TALK  24 Hour Availability for Walk-IN services  [facility]  [address] [place], [place] [phone] Crisis [phone]    Other crisis resources:  [facility] [employer] (Domestic Violence, Rape & Victim Assistance [phone]  [facility] [employer] Crisis [facility]    (ONLY from 8am-4pm)    [phone]  [facility] Mobile Crisis Unit (24/7)   [phone]  [place] National Suicide Hotline   [phone] (TALK)   Take care and seek immediate care sooner if you develop any concerns.  Levin Erp, MD

## 2023-09-16 NOTE — Assessment & Plan Note (Signed)
 Depression exacerbated by late husband's birthday anniversary. Increased cannabis use noted. No active suicidal ideation. Engaged in therapy, declines medication. - Continue therapy sessions - AVS resources provided

## 2023-09-19 ENCOUNTER — Other Ambulatory Visit (HOSPITAL_COMMUNITY): Payer: Self-pay

## 2023-10-01 DIAGNOSIS — D360 Benign neoplasm of lymph nodes: Secondary | ICD-10-CM | POA: Diagnosis not present

## 2023-10-01 DIAGNOSIS — R928 Other abnormal and inconclusive findings on diagnostic imaging of breast: Secondary | ICD-10-CM | POA: Diagnosis not present

## 2023-10-01 DIAGNOSIS — N644 Mastodynia: Secondary | ICD-10-CM | POA: Diagnosis not present

## 2023-10-01 DIAGNOSIS — N6489 Other specified disorders of breast: Secondary | ICD-10-CM | POA: Diagnosis not present

## 2023-10-01 LAB — HM MAMMOGRAPHY

## 2023-10-02 ENCOUNTER — Encounter: Payer: Self-pay | Admitting: Family Medicine

## 2023-10-06 ENCOUNTER — Other Ambulatory Visit: Payer: Self-pay | Admitting: Student

## 2023-10-06 ENCOUNTER — Other Ambulatory Visit (HOSPITAL_COMMUNITY): Payer: Self-pay

## 2023-10-06 ENCOUNTER — Other Ambulatory Visit: Payer: Self-pay | Admitting: Family Medicine

## 2023-10-07 ENCOUNTER — Other Ambulatory Visit: Payer: Self-pay

## 2023-10-07 MED ORDER — FUROSEMIDE 40 MG PO TABS
40.0000 mg | ORAL_TABLET | Freq: Every day | ORAL | 3 refills | Status: DC | PRN
  Filled 2023-10-07: qty 30, 30d supply, fill #0

## 2023-10-07 MED ORDER — DIGOXIN 125 MCG PO TABS
0.1250 mg | ORAL_TABLET | Freq: Every day | ORAL | 2 refills | Status: DC
  Filled 2023-10-07: qty 30, 30d supply, fill #0

## 2023-10-07 MED ORDER — PANTOPRAZOLE SODIUM 40 MG PO TBEC
40.0000 mg | DELAYED_RELEASE_TABLET | Freq: Every day | ORAL | 2 refills | Status: DC
Start: 2023-10-07 — End: 2023-12-24
  Filled 2023-10-07: qty 30, 30d supply, fill #0
  Filled 2023-11-17: qty 30, 30d supply, fill #1
  Filled 2023-12-17: qty 30, 30d supply, fill #2

## 2023-10-07 NOTE — Telephone Encounter (Signed)
 Chart reviewed. Rx refilled. Latest cardiology visit in 03/25.

## 2023-10-07 NOTE — Telephone Encounter (Signed)
 Chart reviewed. Continue lasix 40 prn per cardiology office visit note in 09/09/23.

## 2023-10-08 ENCOUNTER — Ambulatory Visit: Admitting: Family Medicine

## 2023-10-08 ENCOUNTER — Other Ambulatory Visit: Payer: Self-pay

## 2023-10-09 ENCOUNTER — Other Ambulatory Visit: Payer: Self-pay

## 2023-10-10 ENCOUNTER — Other Ambulatory Visit (HOSPITAL_COMMUNITY): Payer: Self-pay

## 2023-10-13 ENCOUNTER — Other Ambulatory Visit: Payer: Self-pay | Admitting: Family Medicine

## 2023-10-13 ENCOUNTER — Other Ambulatory Visit (HOSPITAL_COMMUNITY): Payer: Self-pay

## 2023-10-13 ENCOUNTER — Other Ambulatory Visit: Payer: Self-pay

## 2023-10-14 ENCOUNTER — Other Ambulatory Visit (HOSPITAL_COMMUNITY): Payer: Self-pay

## 2023-10-14 ENCOUNTER — Other Ambulatory Visit: Payer: Self-pay

## 2023-10-14 MED ORDER — LOSARTAN POTASSIUM 25 MG PO TABS
12.5000 mg | ORAL_TABLET | Freq: Every day | ORAL | 0 refills | Status: DC
  Filled 2023-10-14 (×2): qty 30, 60d supply, fill #0

## 2023-10-14 NOTE — Telephone Encounter (Signed)
 Chart reviewed. Rx refilled.

## 2023-10-15 ENCOUNTER — Encounter: Payer: Self-pay | Admitting: Family Medicine

## 2023-10-15 ENCOUNTER — Ambulatory Visit (INDEPENDENT_AMBULATORY_CARE_PROVIDER_SITE_OTHER): Admitting: Family Medicine

## 2023-10-15 ENCOUNTER — Other Ambulatory Visit: Payer: Self-pay

## 2023-10-15 VITALS — BP 108/74 | Ht 63.5 in | Wt 175.0 lb

## 2023-10-15 DIAGNOSIS — M25512 Pain in left shoulder: Secondary | ICD-10-CM | POA: Diagnosis not present

## 2023-10-15 DIAGNOSIS — S46012A Strain of muscle(s) and tendon(s) of the rotator cuff of left shoulder, initial encounter: Secondary | ICD-10-CM

## 2023-10-15 DIAGNOSIS — M75102 Unspecified rotator cuff tear or rupture of left shoulder, not specified as traumatic: Secondary | ICD-10-CM | POA: Insufficient documentation

## 2023-10-15 NOTE — Assessment & Plan Note (Addendum)
-   Roses exam, and ultrasound findings are consistent with partial tearing of the subscapularis, supraspinatus, infraspinatus, and teres minor with a small mid belly muscle tear of the rhomboid major on the left. - She has been avoiding movement due to the pain.  I did discuss the importance of continuing range of motion to avoid adhesive capsulitis. - Unfortunately she is having a cardiac surgery in the next couple of weeks.  Due to this and her other comorbidities we are limited in medication. - We discussed multifaceted approach to pain control including ice, heat, lidocaine patches, Voltaren gel, and scheduled Tylenol - She will discuss with her cardiac physician on when she can receive steroid injections and/or nitroglycerin patches - We will get her into physical therapy as possible. - We will follow-up in 6 weeks or sooner as needed

## 2023-10-15 NOTE — Progress Notes (Signed)
 Amber Franco - 53 y.o. female MRN [id number]  Date of birth: 1970/11/17  PCP: Carey Chapman, MD  Subjective:  No chief complaint on file.  Left shoulder and scapula pain  HPI: Past Medical, Surgical, Social, and Family History Reviewed & Updated per EMR.   Patient is a 53 y.o. female here for ongoing pain since January and her left shoulder and scapula after a fall down the stairs.  She was evaluated for cardiac etiology in the ER and with her primary care provider.  She continues to have pain with range of motion of the shoulder as well as over the left rhomboid muscles.  It is worsened with overhead movement and improved with rest. She does note that she is supposed to have cardiac surgery in the next several weeks.  Past Surgical History:  Procedure Laterality Date   RIGHT HEART CATH AND CORONARY ANGIOGRAPHY N/A 07/08/2023   Procedure: RIGHT HEART CATH AND CORONARY ANGIOGRAPHY;  Surgeon: Lauralee Poll, MD;  Location: [facility] INVASIVE CV LAB;  Service: Cardiovascular;  Laterality: N/A;   TUBAL LIGATION      Allergies  Allergen Reactions   Strawberry (Diagnostic) Anaphylaxis   Tape Other (See Comments)    Leaves marks on skin        Objective:  Physical Exam: VS: BP:108/74  HR: bpm  TEMP: ( )  RESP:   HT:5' 3.5" (161.3 cm)   WT:175 lb (79.4 kg)  BMI:30.51  Gen: Well developed, NAD, speaks clearly, comfortable in exam room Respiratory: Normal work of breathing on room air, no respiratory distress Skin: No rashes, abrasions, or ecchymosis MSK:  Left Shoulder: Inspection No abnormalities ROM: limited to 80 degrees flexion and 90 degrees abduction due to pain. Passively full but pain elicited with eccentric motion above 90 degrees Strength: unable to assess due to pain AC joint: NT Special tests: hawkins, neer's, empty can and crossover positive O'brien's, speeds and yergason's negative Neuro: NVID  Ultrasound of left shoulder:  Biceps Tendon SAX and LAX: visualized in  bicipital groove w/out hypoechoic changes, pectoralis and subscapularis insertions in tact, IR/ER does not demonstrate popping of the tendon out of the groove Subscapularis tendon - viewed in SAX and LAX inserting into the inferior lesser tubercle of humerus. echogenics: hypoechoic changes within the tendon confirmed in both views AC joint - no narrowing, w/mild osteophyte formation, positive giser sign Supraspinatus tendon - viewed in SAX and LAX, tendon in tact, insertion at superior facet of greater tubercle of the humerus w/ echogenics: hypoechoic changes within the tendon confirmed in both views Infraspinatus and Teres minor tendons - viewed in LAX and SAX at the middle facet of the greater tubercle of humerus w/ echogenics: hypoechoic changes within the tendon confirmed in both views  Summary: partial tear of the supraspinatus, infraspinatus, subscapularis and teres minor   Ultrasound and interpretation by Dr. Dannis Dy and Dr. Howard Macho    Assessment & Plan:   Left rotator cuff tear - Roses exam, and ultrasound findings are consistent with partial tearing of the subscapularis, supraspinatus, infraspinatus, and teres minor with a small mid belly muscle tear of the rhomboid major on the left. - She has been avoiding movement due to the pain.  I did discuss the importance of continuing range of motion to avoid adhesive capsulitis. - Unfortunately she is having a cardiac surgery in the next couple of weeks.  Due to this and her other comorbidities we are limited in medication. - We discussed multifaceted approach to pain control including  ice, heat, lidocaine patches, Voltaren gel, and scheduled Tylenol - She will discuss with her cardiac physician on when she can receive steroid injections and/or nitroglycerin patches - We will get her into physical therapy as possible. - We will follow-up in 6 weeks or sooner as needed   Berneda Bridges MD [facility] Sports Medicine Fellow

## 2023-10-15 NOTE — Patient Instructions (Signed)
 It was great to meet you today! Call you insurance company to find an in-network Physical Therapy provider near you. Once you have that info, call us  or mychart message and let us  know so we can place the referral. Please only wear the sling for severe pain. Call with questions.

## 2023-10-16 ENCOUNTER — Other Ambulatory Visit: Payer: Self-pay

## 2023-10-20 ENCOUNTER — Telehealth (HOSPITAL_COMMUNITY): Payer: Self-pay | Admitting: *Deleted

## 2023-10-20 NOTE — Telephone Encounter (Signed)
Reaching out to patient to offer assistance regarding upcoming cardiac imaging study; pt verbalizes understanding of appt date/time, parking situation and where to check in, pre-test NPO status, and verified current allergies; name and call back number provided for further questions should they arise  Larey Brick RN Navigator Cardiac Imaging [facility] [phone] office [phone] cell  Patient verbalized understanding of diet prep.

## 2023-10-22 ENCOUNTER — Encounter (HOSPITAL_COMMUNITY)
Admission: RE | Admit: 2023-10-22 | Discharge: 2023-10-22 | Disposition: A | Discharge status: Home / Self Care | Source: Ambulatory Visit | Attending: Cardiology | Admitting: Cardiology

## 2023-10-22 DIAGNOSIS — R931 Abnormal findings on diagnostic imaging of heart and coronary circulation: Secondary | ICD-10-CM | POA: Insufficient documentation

## 2023-10-22 DIAGNOSIS — R918 Other nonspecific abnormal finding of lung field: Secondary | ICD-10-CM | POA: Diagnosis not present

## 2023-10-22 DIAGNOSIS — I5022 Chronic systolic (congestive) heart failure: Secondary | ICD-10-CM | POA: Diagnosis not present

## 2023-10-22 LAB — NM PET CT MYOCARDIAL SARCOIDOSIS
LV dias vol: 144 mL (ref 46–106)
Nuc Stress EF: 26 %
Rest Nuclear Isotope Dose: 20.6 mCi

## 2023-10-22 MED ORDER — RUBIDIUM RB82 GENERATOR (RUBYFILL)
20.6500 | PACK | Freq: Once | INTRAVENOUS | Status: AC
  Administered 2023-10-22: 20.65 via INTRAVENOUS

## 2023-10-22 MED ORDER — FLUDEOXYGLUCOSE F - 18 (FDG) INJECTION
8.8500 | Freq: Once | INTRAVENOUS | Status: AC
  Administered 2023-10-22: 8.85 via INTRAVENOUS

## 2023-10-28 ENCOUNTER — Telehealth (HOSPITAL_COMMUNITY): Payer: Self-pay

## 2023-10-28 NOTE — Telephone Encounter (Signed)
 Received medicaid form back incorrectly from Dr. Leldon Push office. Will resend medicaid form with note.

## 2023-11-03 NOTE — Progress Notes (Signed)
   SUBJECTIVE:   CHIEF COMPLAINT / HPI: Pap smear  Pap Smear: Patient is a 53 y.o. female presenting for pap smear and STD screening.  She states she does have increased discharge (mainly blood d/t her period), but no odor or vaginal symptoms.  She is interested in screening for sexually transmitted infections today. She is on contraception with tubal ligation in the past.  Reports being sexually active last month, condom broke, known partner, but would like to confirm she is negative for STDs.  PERTINENT  PMH / PSH: None relevant  OBJECTIVE:   BP 110/74   Pulse 85   Ht 5\' 3"  (1.6 m)   Wt 179 lb 3.2 oz (81.3 kg)   LMP 11/05/2023   SpO2 99%   BMI 31.74 kg/m    General: NAD, awake and alert HEENT: Normocephalic, atraumatic. Conjunctiva normal. No nasal discharge Cardiovascular: Regular rate. Respiratory: Normal WOB on RA. Extremities: No BLE edema, no deformities or significant joint findings.  Skin: Warm and dry. Neuro: No focal neurological deficits F GU: Normally developed genitalia with no external lesions or eruptions. Vagina and cervix show no lesions, inflammation, or tenderness, with bloody discharge present d/t being on her menstrual cycle. Chaperoned by CMA Alisa App.   ASSESSMENT/PLAN:   Assessment & Plan Screening for cervical cancer 53 y.o. female here for pap smear.  Physical exam significant for bloody vaginal discharge d/t her menstrual cycle.  Patient is interested in STI screening.   - Pap smear performed, will follow results - STD screening performed, will follow results - Labs: RPR, HIV collected, will follow results - Follow-up as needed Screening examination for STD (sexually transmitted disease) - HIV and RPR Chronic systolic heart failure (HCC) Refilled Farxiga  and Lasix . Denies any SOB or CP today. Hypertension, unspecified type BP stable and controlled. Refilled Losartan  25 mg daily. Cardiology recently increased the dose from 12.5 mg. -  CMP Vaginal discharge Wet prep performed today for BV.  [name], DO [place] [facility]

## 2023-11-06 ENCOUNTER — Ambulatory Visit: Admitting: Family Medicine

## 2023-11-06 ENCOUNTER — Telehealth: Payer: Self-pay

## 2023-11-06 ENCOUNTER — Other Ambulatory Visit (HOSPITAL_COMMUNITY)
Admission: RE | Admit: 2023-11-06 | Discharge: 2023-11-06 | Disposition: A | Discharge status: Home / Self Care | Source: Ambulatory Visit | Attending: Family Medicine | Admitting: Family Medicine

## 2023-11-06 ENCOUNTER — Other Ambulatory Visit (HOSPITAL_COMMUNITY): Payer: Self-pay

## 2023-11-06 DIAGNOSIS — N898 Other specified noninflammatory disorders of vagina: Secondary | ICD-10-CM

## 2023-11-06 DIAGNOSIS — Z124 Encounter for screening for malignant neoplasm of cervix: Secondary | ICD-10-CM

## 2023-11-06 DIAGNOSIS — Z113 Encounter for screening for infections with a predominantly sexual mode of transmission: Secondary | ICD-10-CM | POA: Diagnosis not present

## 2023-11-06 DIAGNOSIS — I5022 Chronic systolic (congestive) heart failure: Secondary | ICD-10-CM | POA: Diagnosis not present

## 2023-11-06 DIAGNOSIS — I10 Essential (primary) hypertension: Secondary | ICD-10-CM | POA: Diagnosis not present

## 2023-11-06 LAB — POCT WET PREP (WET MOUNT)
Clue Cells Wet Prep Whiff POC: NEGATIVE
Trichomonas Wet Prep HPF POC: ABSENT

## 2023-11-06 MED ORDER — DAPAGLIFLOZIN PROPANEDIOL 10 MG PO TABS
10.0000 mg | ORAL_TABLET | Freq: Every day | ORAL | 0 refills | Status: DC
  Filled 2023-11-06 – 2023-11-17 (×2): qty 30, 30d supply, fill #0

## 2023-11-06 MED ORDER — FUROSEMIDE 40 MG PO TABS
40.0000 mg | ORAL_TABLET | Freq: Every day | ORAL | 3 refills | Status: DC
Start: 2023-11-06 — End: 2023-12-05
  Filled 2023-11-06 – 2023-11-17 (×2): qty 90, 90d supply, fill #0

## 2023-11-06 MED ORDER — MELATONIN 5 MG PO TABS
5.0000 mg | ORAL_TABLET | Freq: Every day | ORAL | 0 refills | Status: DC
  Filled 2023-11-06 – 2023-11-17 (×2): qty 90, 90d supply, fill #0

## 2023-11-06 MED ORDER — LOSARTAN POTASSIUM 25 MG PO TABS
25.0000 mg | ORAL_TABLET | Freq: Every day | ORAL | 3 refills | Status: DC
Start: 2023-11-06 — End: 2023-12-05
  Filled 2023-11-06 – 2023-11-17 (×2): qty 90, 90d supply, fill #0

## 2023-11-06 NOTE — Patient Instructions (Signed)
 It was great to see you today! Thank you for choosing [facility] for your primary care. Amber Franco was seen for Pap and STD screening.  Today we addressed: Pap smear/STD screening - swabs done and will relay results through MyChart. Blood tests collected. Shoulder pain - ice, tylenol , lidocaine  patches, and Voltaren  gel  We are checking some labs today. I will send you a MyChart message with your results, per your preference. If you do not hear about your labs in the next 2 weeks, please call the office.  You should return to our clinic No follow-ups on file. Please arrive 15 minutes before your appointment to ensure smooth check in process.  We appreciate your efforts in making this happen.  Thank you for allowing me to participate in your care, [name], DO 11/06/2023, 12:10 PM PGY-1, [facility]

## 2023-11-06 NOTE — Assessment & Plan Note (Addendum)
 BP stable and controlled. Refilled Losartan  25 mg daily. Cardiology recently increased the dose from 12.5 mg. - CMP

## 2023-11-06 NOTE — Telephone Encounter (Signed)
 Called patient and verified name and DOB.   Advised patient of negative wet prep results per Dr. Randeen Busman.   Advised patient that we would reach out once we had the rest of her results.   Patient appreciative.   Elsie Halo, RN

## 2023-11-06 NOTE — Assessment & Plan Note (Addendum)
 Refilled Farxiga  and Lasix . Denies any SOB or CP today.

## 2023-11-07 LAB — COMPREHENSIVE METABOLIC PANEL WITH GFR
ALT: 16 IU/L (ref 0–32)
AST: 13 IU/L (ref 0–40)
Albumin: 4.8 g/dL (ref 3.8–4.9)
Alkaline Phosphatase: 69 IU/L (ref 44–121)
BUN/Creatinine Ratio: 12 (ref 9–23)
BUN: 11 mg/dL (ref 6–24)
Bilirubin Total: 0.8 mg/dL (ref 0.0–1.2)
CO2: 21 mmol/L (ref 20–29)
Calcium: 10 mg/dL (ref 8.7–10.2)
Chloride: 102 mmol/L (ref 96–106)
Creatinine, Ser: 0.9 mg/dL (ref 0.57–1.00)
Globulin, Total: 2.7 g/dL (ref 1.5–4.5)
Glucose: 95 mg/dL (ref 70–99)
Potassium: 3.9 mmol/L (ref 3.5–5.2)
Sodium: 140 mmol/L (ref 134–144)
Total Protein: 7.5 g/dL (ref 6.0–8.5)
eGFR: 77 mL/min/{1.73_m2} (ref >59)

## 2023-11-07 LAB — RPR: RPR Ser Ql: NONREACTIVE

## 2023-11-07 LAB — HIV ANTIBODY (ROUTINE TESTING W REFLEX): HIV Screen 4th Generation wRfx: NONREACTIVE

## 2023-11-10 ENCOUNTER — Other Ambulatory Visit: Payer: Self-pay

## 2023-11-12 ENCOUNTER — Ambulatory Visit: Payer: Self-pay | Admitting: Family Medicine

## 2023-11-12 ENCOUNTER — Ambulatory Visit: Admitting: Family Medicine

## 2023-11-12 LAB — CYTOLOGY - PAP
Chlamydia: NEGATIVE
Comment: NEGATIVE
Comment: NEGATIVE
Comment: NEGATIVE
Comment: NORMAL
Diagnosis: NEGATIVE
Diagnosis: REACTIVE
High risk HPV: NEGATIVE
Neisseria Gonorrhea: NEGATIVE
Trichomonas: POSITIVE — AB

## 2023-11-13 ENCOUNTER — Other Ambulatory Visit: Payer: Self-pay | Admitting: Family Medicine

## 2023-11-13 MED ORDER — METRONIDAZOLE 500 MG PO TABS: 500.0000 mg | ORAL_TABLET | Freq: Two times a day (BID) | ORAL | 0 refills | Status: DC

## 2023-11-13 NOTE — Progress Notes (Signed)
 Called patient to relay her positive Trichomonas result on pap smear. Otherwise, all results are normal. Advised patient that this is an STD and she and her partner should be treated.   Prescribed Metronidazole 500 mg BID for 7 days. Patient agreed and understood.  [name], DO 11/13/23 8:44 AM

## 2023-11-17 ENCOUNTER — Other Ambulatory Visit: Payer: Self-pay | Admitting: Family Medicine

## 2023-11-17 ENCOUNTER — Other Ambulatory Visit: Payer: Self-pay

## 2023-11-17 ENCOUNTER — Other Ambulatory Visit (HOSPITAL_COMMUNITY): Payer: Self-pay

## 2023-11-17 MED ORDER — DAPAGLIFLOZIN PROPANEDIOL 10 MG PO TABS: 10.0000 mg | ORAL_TABLET | Freq: Every day | ORAL | 3 refills | Status: DC

## 2023-11-17 MED ORDER — DAPAGLIFLOZIN PROPANEDIOL 10 MG PO TABS
10.0000 mg | ORAL_TABLET | Freq: Every day | ORAL | 2 refills | Status: DC
  Filled 2023-11-17: qty 90, 90d supply, fill #0

## 2023-11-17 NOTE — Progress Notes (Signed)
 Updated rx to allow 90 supply per refill. Prescribed as noted in most recent Cardiology office visit note.

## 2023-11-17 NOTE — Telephone Encounter (Signed)
 Patient LVM on nurse line regarding refill.   She is requesting 90 day supply of Farxiga .   Please advise if quantity can be updated to reflect a 90 day supply.   Elsie Halo, RN

## 2023-11-17 NOTE — Telephone Encounter (Signed)
 Chart reviewed. Rx refilled as indicated in most recent Cards clinic visit.

## 2023-11-18 ENCOUNTER — Other Ambulatory Visit: Payer: Self-pay

## 2023-11-18 ENCOUNTER — Other Ambulatory Visit (HOSPITAL_COMMUNITY): Payer: Self-pay

## 2023-11-19 ENCOUNTER — Other Ambulatory Visit: Payer: Self-pay

## 2023-11-20 ENCOUNTER — Other Ambulatory Visit: Payer: Self-pay

## 2023-11-21 ENCOUNTER — Ambulatory Visit (INDEPENDENT_AMBULATORY_CARE_PROVIDER_SITE_OTHER): Admitting: Family Medicine

## 2023-11-21 ENCOUNTER — Ambulatory Visit: Admitting: Student

## 2023-11-21 VITALS — BP 111/77 | HR 100 | Ht 63.0 in | Wt 176.4 lb

## 2023-11-21 DIAGNOSIS — N750 Cyst of Bartholin's gland: Secondary | ICD-10-CM | POA: Diagnosis not present

## 2023-11-21 NOTE — Progress Notes (Signed)
    SUBJECTIVE:   CHIEF COMPLAINT / HPI:   Vaginal boil On the left side of her vagina.  Has been mildly painful.  This has happened before, she has had it lanced.  No fevers.  She was able to pop it mildly herself and get some pus out, and has improved since then.  She has also been doing warm soaks.  She does not currently have an OB/GYN.  PERTINENT  PMH / PSH: Nonischemic cardiomyopathy, hypertension, tobacco use, GAD, fibromyalgia, depression  OBJECTIVE:   BP 111/77   Pulse 100   Ht 5\' 3"  (1.6 m)   Wt 176 lb 6.4 oz (80 kg)   LMP 11/05/2023   SpO2 100%   BMI 31.25 kg/m   General: Alert and oriented, in NAD HEENT: NCAT, EOM grossly normal, midline nasal septum Respiratory: Breathing and speaking comfortably on RA Genitourinary: Left lower labial swelling without fluctuance or particular induration, reassuringly without pronounced warmth/erythema and no drainage Extremities: Moves all extremities grossly equally Neurological: No gross focal deficit Psychiatric: Appropriate mood and affect  ASSESSMENT/PLAN:   Assessment & Plan Bartholin gland cyst Recurrence per report.  Reassuringly without acute signs of severe infection.  Given that it has improved with self popping at home along with compresses, advised to continue compresses for now.  If worsening, advised to follow-up with OB for marsupialization or fistulization; referral to OB sent to establish care there.  Advised to go to ED if worsening before able to follow-up with OB.   Genetta Kenning, MD [facility]

## 2023-11-21 NOTE — Patient Instructions (Signed)
 You have a bartholin gland cyst. I would recommend returning to your OB or the ED if not able to get in with them and the cyst worsens. They can do a procedure to remove it there.

## 2023-11-21 NOTE — Assessment & Plan Note (Signed)
 Recurrence per report.  Reassuringly without acute signs of severe infection.  Given that it has improved with self popping at home along with compresses, advised to continue compresses for now.  If worsening, advised to follow-up with OB for marsupialization or fistulization; referral to OB sent to establish care there.  Advised to go to ED if worsening before able to follow-up with OB.

## 2023-11-25 ENCOUNTER — Ambulatory Visit

## 2023-11-27 ENCOUNTER — Ambulatory Visit (HOSPITAL_COMMUNITY): Payer: Self-pay | Admitting: Cardiology

## 2023-11-28 ENCOUNTER — Telehealth (HOSPITAL_COMMUNITY): Payer: Self-pay | Admitting: Cardiology

## 2023-11-28 NOTE — Telephone Encounter (Signed)
 front office left pt a voicemail to reschedule appt w/ Dr. Alease Amend on 12/05/23 at 11:40 AM

## 2023-12-05 ENCOUNTER — Other Ambulatory Visit (HOSPITAL_COMMUNITY): Payer: Self-pay

## 2023-12-05 ENCOUNTER — Other Ambulatory Visit: Payer: Self-pay

## 2023-12-05 ENCOUNTER — Ambulatory Visit (HOSPITAL_COMMUNITY)
Admission: RE | Admit: 2023-12-05 | Discharge: 2023-12-05 | Disposition: A | Discharge status: Home / Self Care | Source: Ambulatory Visit | Attending: Cardiology | Admitting: Cardiology

## 2023-12-05 DIAGNOSIS — I5022 Chronic systolic (congestive) heart failure: Secondary | ICD-10-CM | POA: Diagnosis not present

## 2023-12-05 MED ORDER — SPIRONOLACTONE 25 MG PO TABS: 25.0000 mg | ORAL_TABLET | Freq: Every day | ORAL | 3 refills | Status: DC

## 2023-12-05 MED ORDER — FUROSEMIDE 40 MG PO TABS: 40.0000 mg | ORAL_TABLET | Freq: Every day | ORAL | 3 refills | Status: DC

## 2023-12-05 MED ORDER — DIGOXIN 125 MCG PO TABS: 0.1250 mg | ORAL_TABLET | Freq: Every day | ORAL | 3 refills | Status: DC

## 2023-12-05 MED ORDER — SULFAMETHOXAZOLE-TRIMETHOPRIM 800-160 MG PO TABS
1.0000 | ORAL_TABLET | ORAL | 1 refills | Status: DC
  Filled 2023-12-05 – 2023-12-12 (×2): qty 12, 28d supply, fill #0
  Filled 2023-12-17 – 2024-01-05 (×2): qty 12, 28d supply, fill #1

## 2023-12-05 MED ORDER — FARXIGA 10 MG PO TABS: 10.0000 mg | ORAL_TABLET | Freq: Every day | ORAL | 3 refills | Status: DC

## 2023-12-05 MED ORDER — LOSARTAN POTASSIUM 25 MG PO TABS: 25.0000 mg | ORAL_TABLET | Freq: Every day | ORAL | 3 refills | Status: DC

## 2023-12-05 MED ORDER — VITAMIN D 25 MCG (1000 UNIT) PO TABS: 1000.0000 [IU] | ORAL_TABLET | Freq: Every day | ORAL | Status: DC

## 2023-12-05 MED ORDER — METHOTREXATE SODIUM 2.5 MG PO TABS
10.0000 mg | ORAL_TABLET | ORAL | 1 refills | Status: DC
  Filled 2023-12-05 – 2023-12-12 (×2): qty 16, 28d supply, fill #0
  Filled 2023-12-17 – 2024-01-05 (×2): qty 16, 28d supply, fill #1

## 2023-12-05 MED ORDER — FOLIC ACID 1 MG PO TABS
1.0000 mg | ORAL_TABLET | Freq: Every day | ORAL | 3 refills | Status: AC
  Filled 2023-12-05 – 2023-12-12 (×2): qty 90, 90d supply, fill #0
  Filled 2023-12-17 – 2024-03-08 (×2): qty 90, 90d supply, fill #1
  Filled 2024-05-31: qty 90, 90d supply, fill #2

## 2023-12-05 MED ORDER — CALCIUM CARBONATE 1500 (600 CA) MG PO TABS
600.0000 mg | ORAL_TABLET | Freq: Two times a day (BID) | ORAL | Status: AC
Start: 2023-12-05 — End: ?

## 2023-12-05 MED ORDER — PREDNISONE 10 MG PO TABS
30.0000 mg | ORAL_TABLET | Freq: Every day | ORAL | 1 refills | Status: DC
  Filled 2023-12-05 – 2023-12-12 (×2): qty 90, 30d supply, fill #0
  Filled 2023-12-17 – 2024-01-05 (×2): qty 90, 30d supply, fill #1

## 2023-12-05 MED ORDER — METOPROLOL SUCCINATE ER 25 MG PO TB24
25.0000 mg | ORAL_TABLET | Freq: Every day | ORAL | 3 refills | Status: DC
Start: 2023-12-05 — End: 2024-02-11

## 2023-12-05 NOTE — Patient Instructions (Addendum)
 - Start prednisone  30 mg (3 tablets) daily - Start methotrexate 10 mg (4 tablets) once weekly (every Friday) - Start folic acid 1 mg daily - Start Bactrim DS 1 tablet every Monday/Wednesday/Friday - Start calcium 600 mg twice daily (you can find this over the counter) - Start vitamin D 1000 units daily (you can find this over the counter)  Your physician has requested that you have an echocardiogram. Echocardiography is a painless test that uses sound waves to create images of your heart. It provides your doctor with information about the size and shape of your heart and how well your heart's chambers and valves are working. This procedure takes approximately one hour. There are no restrictions for this procedure. Please do NOT wear cologne, perfume, aftershave, or lotions (deodorant is allowed). Please arrive 15 minutes prior to your appointment time.  Please note: We ask at that you not bring children with you during ultrasound (echo/ vascular) testing. Due to room size and safety concerns, children are not allowed in the ultrasound rooms during exams. Our front office staff cannot provide observation of children in our lobby area while testing is being conducted. An adult accompanying a patient to their appointment will only be allowed in the ultrasound room at the discretion of the ultrasound technician under special circumstances. We apologize for any inconvenience.   Cardiac Sarcoidosis/Inflammation PET Scan Patient Instructions  Please report to Radiology at the [facility] Main Entrance 15 minutes early for your test.  [address] [place], [place] [address] BRING FOOD DIARY WITH YOU TO THIS APPOINTMENT For 24 hours before the test: Do not exercise! Do not eat after 5 pm the day before your test! To make sure that your scanning results are accurate, you MUST follow the sarcoid prep meal diet starting the day before your PET scan. This diet involves eating no carbohydrates  24 hours before the test.  You will keep a log of all that you eat the day before your test. If you have questions or do not understand this diet, please call [phone] for more information. If you are unable to follow this diet, please discuss an alternative strategy with the coordinator.  If you are diabetic, continue your diabetes medications as usual on the day before until you begin to fast. NO DIABETES MEDICATIONS ONCE YOU BEGIN TO FAST. What foods can I eat the day before my test?  Drink only water or black coffee (WITHOUT sugar, artificial sweetener, cream, or milk). Eggs (prepared without milk or cheese)  Meat that is either broiled or pan fried in butter WITHOUT breading (chicken, [place], bacon, meat-only sausage, hamburger, steak, fish) Butter, salt & pepper What foods must I AVOID the day before my test?  Do not consume alcoholic beverages, sodas, fruit juice, coffee creamer, or sports drinks  Do not eat vegetables, beans, nuts, fruits, juices, bread, grains, rice, pasta, potatoes, or any baked goods Do not eat dairy products (milk, cheese, etc.)  Do not eat mayonnaise, ketchup, tartar sauce, mustard, or other condiments Do not add sugar, artificial sweeteners, or Splenda (sucralose) to foods or drinks  Do not eat breaded foods (like fried chicken)  Do not eat sweets, candy, gum, sweetened cough drops, lozenges, or sugar  Do not eat sweetened, grilled, or cured meats or meat with carbohydrate-containing additives (some sausages, ham, sweetened bacon) Suggested items for breakfast, lunch, or dinner:  Breakfast  3 to 5 fatty sausage links fried in butter. 3 to 5 bacon strips.  3 eggs pan fried in butter (no milk or cheese).  Lunch/Dinner  2 hamburger patties fried in butter. Chicken or fatty fish pan fried in butter. No breading. 8 oz. fatty steak pan fried in butter.  Beverages  Drink only water or black coffee. DO NOT ADD SUGAR, ARTIFICIAL SWEETENER, CREAM, OR MILK  Please  call [facility] to cancel or reschedule your appointment at [phone] For more information and frequently asked questions, please visit our website : [email or web address]   Cardiac Sarcoidosis/Inflammation PET Scan  Food Diary Name: _____________________________ Please fill in EXACTLY what you have eaten and when for 24 hours PRIOR to your test date.  Time Food/Drink Comments  Breakfast                Lunch                Dinner                Snacks                 DO NOT EXERCISE THE DAY BEFORE YOUR TEST DO NOT EAT AFTER 5 PM THE DAY BEFORE YOUR TEST.  ON THE DAY OF YOUR TEST, DO NOT EAT ANY FOOD AND ONLY DRINK CLEAR WATER! PLEASE BRING THIS FOOD DIARY WITH YOU TO YOUR APPOINTMENT  Please follow up with our heart failure pharmacist as scheduled.  Your physician recommends that you schedule a follow-up appointment in: 3 months with an echocardiogram.  If you have any questions or concerns before your next appointment please send us  a message through [place] or call our office at [phone].    TO LEAVE A MESSAGE FOR THE NURSE SELECT OPTION 2, PLEASE LEAVE A MESSAGE INCLUDING: YOUR NAME DATE OF BIRTH CALL BACK NUMBER REASON FOR CALL**this is important as we prioritize the call backs  YOU WILL RECEIVE A CALL BACK THE SAME DAY AS LONG AS YOU CALL BEFORE 4:00 PM  At the [facility], you and your health needs are our priority. As part of our continuing mission to provide you with exceptional heart care, we have created designated Provider Care Teams. These Care Teams include your primary Cardiologist (physician) and Advanced Practice Providers (APPs- Physician Assistants and Nurse Practitioners) who all work together to provide you with the care you need, when you need it.   You may see any of the following providers on your designated Care Team at your next follow up: Dr Jules Oar Dr Peder Bourdon Dr. Alwin Baars Dr. Arta Lark Amy Marijane Shoulders, NP Ruddy Corral, [place] [facility] [place], [place] Dennise Fitz, NP [place] Lee, NP Shawnee Dellen, NP Luster Salters, PharmD Bevely Brush, PharmD   Please be sure to bring in all your medications bottles to every appointment.    Thank you for choosing [facility]

## 2023-12-08 ENCOUNTER — Other Ambulatory Visit: Payer: Self-pay

## 2023-12-11 ENCOUNTER — Other Ambulatory Visit: Payer: Self-pay

## 2023-12-12 ENCOUNTER — Other Ambulatory Visit (HOSPITAL_COMMUNITY): Payer: Self-pay

## 2023-12-12 ENCOUNTER — Telehealth (HOSPITAL_COMMUNITY): Payer: Self-pay | Admitting: Cardiology

## 2023-12-12 ENCOUNTER — Other Ambulatory Visit: Payer: Self-pay

## 2023-12-12 NOTE — Telephone Encounter (Signed)
 Patient called to request letter to give to disability office explaining cardiac condition/history

## 2023-12-14 NOTE — Progress Notes (Signed)
 ADVANCED HEART FAILURE FOLLOW UP CLINIC NOTE  Referring Physician: Carey Chapman, MD  Primary Care: Carey Chapman, MD Primary Cardiologist:  HPI: Amber Franco is a 53 y.o. female who presents for follow up of chronic systolic heart failure.      Patient presented with chest pain, orthopnea, and shortness of breath after recent deaths of her husband and father. Symptomatic with echocardiogram with EF of less than 20% with global LV hypokinesis, LV apex swirling and severely reduced RV function as well as moderate MVR. Underwent IV diuresis, GDMT, L/RHC (no ischemic disease), cardiac MR (Mild LV dilatation w/ severe systolic dysfunction (EF 25%), Normal RV w/ moderate systolic dysfunction (EF 37%), multifocal LGE possible sarcoidosis vs DCM vs prior myocarditis (recommended FDG-PET), moderate MR (22%), small pericardial effusion ). She was stable and started on spironolactone , farxiga , losartan , digoxin  and transitioned to oral lasix .     SUBJECTIVE:  Overall patient is doing well, though does have some MSK pain, ongoing. Staying active, exertional symptoms are limited but does still have occasional sharp, posiitional chest pain. We discussed her recent diagnosis of cardiac sarcoid and what treatment entails. Also had a long discussion about potential ICD therapy. She is very concerned about any sort of procedures and would like to avoid at this time. We acknowledged that she is relatively high risk but will plan for repeat echo and next visit and if reduced at that time would proceed.   PMH, current medications, allergies, social history, and family history reviewed in epic.  PHYSICAL EXAM: Vitals:   12/05/23 1114  BP: 104/70  Pulse: 85  SpO2: 98%    GENERAL: NAD, well appearing PULM:  Normal work of breathing, CTAB CARDIAC:  JVP: flat         Normal rate with regular rhythm. No murmurs, rubs or gallops.  No edema. Warm and well perfused extremities. ABDOMEN: Soft, non-tender,  non-distended. NEUROLOGIC: Patient is oriented x3 with no focal or lateralizing neurologic deficits.    DATA REVIEW  ECG: 07/2023: Sinus tachy, LAE  ECHO: 07/2023: LVEF 20-25%, RV function severely reduced as well  CATH: 07/2023: Normal right dominant coronary arteriography, RA 8, PA 48/32 (39), PCWP 25, Fick CO 3.55L/min, Fick CI 1.95L/min/m2  CMR:  cardiac MR (Mild LV dilatation w/ severe systolic dysfunction (EF 25%), Normal RV w/ moderate systolic dysfunction (EF 37%), multifocal LGE possible sarcoidosis vs DCM vs prior myocarditis (recommended FDG-PET), moderate MR (22%), small pericardial effusion ).   PET: FDG uptake in the apical to basal anterolateral papillary muscle, concerning for active sarcoid  Heart failure review: - Classification: Heart failure with reduced EF - Etiology: Cardiac sarcoid - NYHA Class: II - Volume status: Euvolemic - ACEi/ARB/ARNI: Currently up-titrating - Aldosterone antagonist: Currently up-titrating - Beta-blocker: Currently up-titrating - Digoxin : Maximally tolerated dose - Hydralazine/Nitrates: Intolerant - SGLT2i: Maximally tolerated dose - GLP-1: Not a candidate - Advanced therapies: Not needed at this time - ICD: Currently uptitrating GDMT  ASSESSMENT & PLAN:  Chronic systolic heart failure: Suspected due to cardiac sarcoid, discussion with patient today. She is amenable to starting treatment, appreciate pharmacy assistance. We had a long discussion as above about potential ICD, would strongly like to avoid at this time. - PET sarcoid positive - Start prednisone  30mg  daily, methotrexate  10mg  qweekly - Start folic acid  1mg  daily, bactrim  MWF, calcium  and Vitamin D  - Repeat echo and PET ordered - Wean prednisone  and increase methotrexate  per protocol - Continue digoxin  0.125mg  daily - Continue farxiga  10mg  daily, lasix  40mg   prn, losartan  12.5mg  daily, spironlactone 25mg  daily, metoprolol  succinate 25mg  daily -Cardiac rehab  HTN -BP  soft but stable, cannot titrate medical therapy further  Chest pain: Noncardiac sounding, unlikely to be related to sarcoid but may improve with steroids.  I spent 50 minutes caring for this patient today including face to face time, ordering and reviewing labs, reviewing records from recent PET scan, developing sarcoid treatment plan with pharmacy, risk benefit discussion about ICD, seeing the patient, documenting in the record, and arranging follow ups.    Arta Lark, MD Advanced Heart Failure Mechanical Circulatory Support 12/14/23

## 2023-12-15 ENCOUNTER — Other Ambulatory Visit (HOSPITAL_COMMUNITY): Payer: Self-pay

## 2023-12-16 ENCOUNTER — Other Ambulatory Visit (HOSPITAL_COMMUNITY): Payer: Self-pay

## 2023-12-17 ENCOUNTER — Other Ambulatory Visit (HOSPITAL_COMMUNITY): Payer: Self-pay

## 2023-12-17 ENCOUNTER — Other Ambulatory Visit: Payer: Self-pay

## 2023-12-18 ENCOUNTER — Other Ambulatory Visit (HOSPITAL_COMMUNITY): Payer: Self-pay

## 2023-12-18 ENCOUNTER — Other Ambulatory Visit: Payer: Self-pay | Admitting: Family Medicine

## 2023-12-18 ENCOUNTER — Other Ambulatory Visit: Payer: Self-pay

## 2023-12-18 MED ORDER — FUROSEMIDE 40 MG PO TABS
40.0000 mg | ORAL_TABLET | Freq: Every day | ORAL | 3 refills | Status: AC
  Filled 2024-01-28: qty 90, 90d supply, fill #0
  Filled 2024-04-05: qty 90, 90d supply, fill #1
  Filled 2024-05-04: qty 90, 90d supply, fill #2
  Filled 2024-05-05: qty 30, 30d supply, fill #2
  Filled 2024-06-17: qty 30, 30d supply, fill #3
  Filled 2024-07-19: qty 30, 30d supply, fill #4
  Filled 2024-08-04: qty 30, 30d supply, fill #5
  Filled ????-??-??: fill #5

## 2023-12-18 MED ORDER — DIGOXIN 125 MCG PO TABS
0.1250 mg | ORAL_TABLET | Freq: Every day | ORAL | 3 refills | Status: AC
  Filled 2023-12-18: qty 90, 90d supply, fill #0
  Filled 2024-03-08: qty 90, 90d supply, fill #1
  Filled 2024-06-15: qty 90, 90d supply, fill #2

## 2023-12-18 MED ORDER — SPIRONOLACTONE 25 MG PO TABS
25.0000 mg | ORAL_TABLET | Freq: Every day | ORAL | 3 refills | Status: AC
  Filled 2024-03-08: qty 90, 90d supply, fill #0
  Filled 2024-06-15: qty 90, 90d supply, fill #1

## 2023-12-18 MED ORDER — METOPROLOL SUCCINATE ER 25 MG PO TB24
25.0000 mg | ORAL_TABLET | Freq: Every day | ORAL | 3 refills | Status: AC
  Filled 2024-02-11: qty 90, 90d supply, fill #0
  Filled 2024-05-17: qty 90, 90d supply, fill #1
  Filled 2024-08-04: qty 90, 90d supply, fill #2

## 2023-12-18 MED ORDER — DAPAGLIFLOZIN PROPANEDIOL 10 MG PO TABS
10.0000 mg | ORAL_TABLET | Freq: Every day | ORAL | 3 refills | Status: AC
Start: 2023-12-08 — End: ?
  Filled 2023-12-18 – 2023-12-24 (×2): qty 90, 90d supply, fill #0
  Filled 2024-03-08: qty 90, 90d supply, fill #1
  Filled 2024-06-17: qty 90, 90d supply, fill #2

## 2023-12-18 MED ORDER — LOSARTAN POTASSIUM 25 MG PO TABS
25.0000 mg | ORAL_TABLET | Freq: Every day | ORAL | 3 refills | Status: AC
  Filled 2024-02-11 (×2): qty 90, 90d supply, fill #0
  Filled 2024-05-04: qty 90, 90d supply, fill #1
  Filled 2024-08-04: qty 90, 90d supply, fill #2

## 2023-12-19 ENCOUNTER — Encounter (HOSPITAL_COMMUNITY): Payer: Self-pay | Admitting: *Deleted

## 2023-12-19 ENCOUNTER — Other Ambulatory Visit: Payer: Self-pay

## 2023-12-19 NOTE — Telephone Encounter (Signed)
 Spoke w/pt, she is in need of letter in support of her SS disability that she has applied for. She request letter be mailed to her when complete  Letter completed and sent via mychart per Dr Alease Amend, copy left for him to sign and mail to pt.

## 2023-12-22 ENCOUNTER — Other Ambulatory Visit: Payer: Self-pay

## 2023-12-24 ENCOUNTER — Other Ambulatory Visit (HOSPITAL_COMMUNITY): Payer: Self-pay

## 2023-12-24 ENCOUNTER — Other Ambulatory Visit: Payer: Self-pay | Admitting: Family Medicine

## 2023-12-25 ENCOUNTER — Other Ambulatory Visit (HOSPITAL_COMMUNITY): Payer: Self-pay

## 2023-12-25 MED ORDER — PANTOPRAZOLE SODIUM 40 MG PO TBEC
40.0000 mg | DELAYED_RELEASE_TABLET | Freq: Every day | ORAL | 5 refills | Status: AC
  Filled 2023-12-25 – 2024-01-09 (×2): qty 90, 90d supply, fill #0
  Filled 2024-05-17: qty 90, 90d supply, fill #1

## 2023-12-25 NOTE — Telephone Encounter (Signed)
 Chart reviewed. Rx refilled.

## 2024-01-05 ENCOUNTER — Other Ambulatory Visit (HOSPITAL_COMMUNITY): Payer: Self-pay

## 2024-01-05 NOTE — Progress Notes (Incomplete)
 ***In Progress***    Advanced Heart Failure Clinic Note  Referring Physician: Diona Perkins, MD  Primary Care: Diona Perkins, MD AHF Cardiologist: Dr. Zenaida  HPI:  Amber Franco is a 53 y.o. female who presents for follow up of chronic systolic heart failure.  Patient presented with chest pain, orthopnea, and shortness of breath after recent deaths of her husband and father. Symptomatic with echocardiogram with EF of less than 20% with global LV hypokinesis, LV apex swirling and severely reduced RV function as well as moderate MVR. Underwent IV diuresis, GDMT, L/RHC (no ischemic disease), cardiac MR (Mild LV dilatation w/ severe systolic dysfunction (EF 25%), Normal RV w/ moderate systolic dysfunction (EF 37%), multifocal LGE possible sarcoidosis vs DCM vs prior myocarditis (recommended FDG-PET), moderate MR (22%), small pericardial effusion ). She was stable and started on spironolactone , farxiga , losartan , digoxin  and transitioned to oral lasix .  ECG: 07/2023: Sinus tachy, LAE  ECHO: 07/2023: LVEF 20-25%, RV function severely reduced as well  CATH: 07/2023: Normal right dominant coronary arteriography, RA 8, PA 48/32 (39), PCWP 25, Fick CO 3.55L/min, Fick CI 1.95L/min/m2  CMR:  cardiac MR (Mild LV dilatation w/ severe systolic dysfunction (EF 25%), Normal RV w/ moderate systolic dysfunction (EF 37%), multifocal LGE possible sarcoidosis vs DCM vs prior myocarditis (recommended FDG-PET), moderate MR (22%), small pericardial effusion ).   PET: FDG uptake in the apical to basal anterolateral papillary muscle, concerning for active sarcoid  At last visit patient stated shed was doing well overall, though did have some MSK pain, ongoing. Staying active, exertional symptoms were limited but does still have occasional sharp, posiitional chest pain. MD discussed her recent diagnosis of cardiac sarcoid and what treatment entails. Also had a long discussion about potential ICD therapy. She was very  concerned about any sort of procedures and would like to avoid at this time. Acknowledged that she is relatively high risk but planned for repeat echo and if reduced at that time would proceed.   Today she returns to HF clinic for pharmacist medication titration. At last visit with MD she was started on prednisone  30 mg daily, methotrexate  10 mg once weekly (every Friday), folic acid  1 mg daily, Bactrim  DS 1 tablet every Monday/Wednesday/Friday,calcium  600 mg twice daily, and vitamin D  1000 units daily.  Overall feeling ***. Dizziness, lightheadedness, fatigue:  Chest pain or palpitations:  How is your breathing?: *** SOB: Able to complete all ADLs. Activity level ***  Weight at home pounds. Takes furosemide /torsemide/bumex *** mg *** daily.  LEE PND/Orthopnea  Appetite *** Low-salt diet:   Physical Exam Cost/affordability of meds   HF Medications: Metoprolol  succinate 25 mg daily Losartan  25 mg daily - was 12.5 mg? Spironolactone  25 mg daily Farxiga  10 mg daily Digoxin  0.125 mg daily  Furosemide  40 mg daily   Has the patient been experiencing any side effects to the medications prescribed?  {YES NO:22349}  Does the patient have any problems obtaining medications due to transportation or finances?   {YES NO:22349}  Understanding of regimen: {excellent/good/fair/poor:19665} Understanding of indications: {excellent/good/fair/poor:19665} Potential of compliance: {excellent/good/fair/poor:19665} Patient understands to avoid NSAIDs. Patient understands to avoid decongestants.    Pertinent Lab Values: Serum creatinine ***, BUN ***, Potassium ***, Sodium ***, BNP ***, Magnesium  ***, Digoxin  ***   Vital Signs: Weight: *** (last clinic weight: ***) Blood pressure: ***  Heart rate: ***   Assessment/Plan: 1. Chronic systolic heart failure: Suspected due to cardiac sarcoid, discussion with patient today. She is amenable to starting treatment, appreciate pharmacy assistance.  We  had a long discussion as above about potential ICD, would strongly like to avoid at this time. - PET sarcoid positive - Continue prednisone  30 mg daily, methotrexate  10 mg weekly*** - Start folic acid  1mg  daily, bactrim  MWF, calcium  and Vitamin D  - Repeat echo and PET ordered - Wean prednisone  and increase methotrexate  per protocol - Continue digoxin  0.125mg  daily - Continue farxiga  10mg  daily, lasix  40mg  prn, losartan  12.5 mg daily, spironlactone 25mg  daily, metoprolol  succinate 25mg  daily -Cardiac rehab  2. HTN -BP soft but stable, cannot titrate medical therapy further   Follow up ***   Tinnie Redman, PharmD, BCPS, BCCP, CPP Heart Failure Clinic Pharmacist [phone]

## 2024-01-06 ENCOUNTER — Other Ambulatory Visit (HOSPITAL_COMMUNITY): Payer: Self-pay

## 2024-01-06 ENCOUNTER — Ambulatory Visit (HOSPITAL_COMMUNITY)
Admission: RE | Admit: 2024-01-06 | Discharge: 2024-01-06 | Disposition: A | Discharge status: Home / Self Care | Source: Ambulatory Visit | Attending: Cardiology | Admitting: Cardiology

## 2024-01-06 ENCOUNTER — Other Ambulatory Visit: Payer: Self-pay

## 2024-01-06 VITALS — BP 112/76 | HR 91 | Wt 181.4 lb

## 2024-01-06 DIAGNOSIS — I5022 Chronic systolic (congestive) heart failure: Secondary | ICD-10-CM | POA: Diagnosis not present

## 2024-01-06 DIAGNOSIS — Z7984 Long term (current) use of oral hypoglycemic drugs: Secondary | ICD-10-CM | POA: Diagnosis not present

## 2024-01-06 DIAGNOSIS — M62838 Other muscle spasm: Secondary | ICD-10-CM | POA: Diagnosis not present

## 2024-01-06 DIAGNOSIS — I110 Hypertensive heart disease with heart failure: Secondary | ICD-10-CM | POA: Diagnosis not present

## 2024-01-06 DIAGNOSIS — R42 Dizziness and giddiness: Secondary | ICD-10-CM | POA: Diagnosis not present

## 2024-01-06 DIAGNOSIS — D8685 Sarcoid myocarditis: Secondary | ICD-10-CM | POA: Diagnosis not present

## 2024-01-06 DIAGNOSIS — Z79899 Other long term (current) drug therapy: Secondary | ICD-10-CM | POA: Diagnosis not present

## 2024-01-06 LAB — CBC WITH DIFFERENTIAL/PLATELET
Abs Immature Granulocytes: 0.75 K/uL — ABNORMAL HIGH (ref 0.00–0.07)
Basophils Absolute: 0.1 K/uL (ref 0.0–0.1)
Basophils Relative: 1 %
Eosinophils Absolute: 0.1 K/uL (ref 0.0–0.5)
Eosinophils Relative: 0 %
HCT: 39.3 % (ref 36.0–46.0)
Hemoglobin: 13.4 g/dL (ref 12.0–15.0)
Immature Granulocytes: 3 %
Lymphocytes Relative: 26 %
Lymphs Abs: 6.1 K/uL — ABNORMAL HIGH (ref 0.7–4.0)
MCH: 30.9 pg (ref 26.0–34.0)
MCHC: 34.1 g/dL (ref 30.0–36.0)
MCV: 90.6 fL (ref 80.0–100.0)
Monocytes Absolute: 1.2 K/uL — ABNORMAL HIGH (ref 0.1–1.0)
Monocytes Relative: 5 %
Neutro Abs: 15.5 K/uL — ABNORMAL HIGH (ref 1.7–7.7)
Neutrophils Relative %: 65 %
Platelets: 342 K/uL (ref 150–400)
RBC: 4.34 MIL/uL (ref 3.87–5.11)
RDW: 11.8 % (ref 11.5–15.5)
Smear Review: NORMAL
WBC: 23.8 K/uL — ABNORMAL HIGH (ref 4.0–10.5)
nRBC: 0 % (ref 0.0–0.2)

## 2024-01-06 LAB — COMPREHENSIVE METABOLIC PANEL WITH GFR
ALT: 18 U/L (ref 0–44)
AST: 14 U/L — ABNORMAL LOW (ref 15–41)
Albumin: 3.9 g/dL (ref 3.5–5.0)
Alkaline Phosphatase: 37 U/L — ABNORMAL LOW (ref 38–126)
Anion gap: 12 (ref 5–15)
BUN: 31 mg/dL — ABNORMAL HIGH (ref 6–20)
CO2: 28 mmol/L (ref 22–32)
Calcium: 9.8 mg/dL (ref 8.9–10.3)
Chloride: 95 mmol/L — ABNORMAL LOW (ref 98–111)
Creatinine, Ser: 1.07 mg/dL — ABNORMAL HIGH (ref 0.44–1.00)
GFR, Estimated: >60 mL/min (ref >60)
Glucose, Bld: 81 mg/dL (ref 70–99)
Potassium: 3.6 mmol/L (ref 3.5–5.1)
Sodium: 135 mmol/L (ref 135–145)
Total Bilirubin: 0.7 mg/dL (ref 0.0–1.2)
Total Protein: 7.2 g/dL (ref 6.5–8.1)

## 2024-01-06 LAB — BRAIN NATRIURETIC PEPTIDE: B Natriuretic Peptide: 10.8 pg/mL (ref 0.0–100.0)

## 2024-01-06 MED ORDER — PREDNISONE 10 MG PO TABS
20.0000 mg | ORAL_TABLET | Freq: Every day | ORAL | 2 refills | Status: DC
  Filled 2024-01-06 – 2024-01-28 (×3): qty 60, 30d supply, fill #0

## 2024-01-06 MED ORDER — METHOTREXATE SODIUM 2.5 MG PO TABS
15.0000 mg | ORAL_TABLET | ORAL | 2 refills | Status: DC
  Filled 2024-01-06: qty 24, 28d supply, fill #0

## 2024-01-06 NOTE — Progress Notes (Signed)
 Advanced Heart Failure Clinic Note  Referring Physician: Diona Perkins, MD  Primary Care: Diona Perkins, MD AHF Cardiologist: Dr. Zenaida  HPI:  Amber Franco is a 53 y.o. female who presents for follow up of chronic systolic heart failure.  Patient presented with chest pain, orthopnea, and shortness of breath after recent deaths of her husband and father. Symptomatic with echocardiogram with EF of less than 20% with global LV hypokinesis, LV apex swirling and severely reduced RV function as well as moderate MVR. Underwent IV diuresis, GDMT, L/RHC (no ischemic disease), cardiac MR (Mild LV dilatation w/ severe systolic dysfunction (EF 25%), Normal RV w/ moderate systolic dysfunction (EF 37%), multifocal LGE possible sarcoidosis vs DCM vs prior myocarditis (recommended FDG-PET), moderate MR (22%), small pericardial effusion ). She was stable and started on spironolactone , farxiga , losartan , digoxin  and transitioned to oral Lasix .  At last visit patient stated she was doing well overall, though did have some MSK pain, ongoing. Staying active, exertional symptoms were limited but does still have occasional sharp, positional chest pain. MD discussed her recent diagnosis of cardiac sarcoid and what treatment entails. Also had a long discussion about potential ICD therapy. She was very concerned about any sort of procedures and would like to avoid at this time. Acknowledged that she is relatively high risk but planned for repeat echo and if reduced at that time would proceed.   Today she returns to HF clinic for pharmacist medication titration. At last visit with MD she was started on prednisone  30 mg daily, methotrexate  10 mg once weekly (takes Mondays), folic acid  1 mg daily, Bactrim  DS 1 tablet every Monday/Wednesday/Friday, calcium  600 mg twice daily, and vitamin D  1000 units daily. Overall today, states she is in a lot of pain due to muscle spasms ongoing for the past 2 days. Endorses some dizziness and  lightheadedness that lasts for ~45-60 minutes. She says this occurs after she takes methotrexate , but has found that taking Tylenol  prior to methotrexate  helps prevent episodes. Endorses MSK chest pain. Says her heart is beating in a weird rhythm but not fluttering. States this happens if she does not take furosemide . Breathing has been horrible, especially since last Thursday, 01/01/24, saying that she feels like she has fluid on her lungs. She took 3 tablets of furosemide  (120 mg total) on 01/02/24, then 2 tablets (80 mg total) on 01/03/24 then back to 1 tablet daily thereafter. Did not take furosemide  yesterday because in too much pain. Says her weight last night was 178.1 lbs. Endorses some LEE, but not much. No LEE on exam. Endorses orthopnea since start of July, since her A/C went out and weather has been hotter. Appetite has been very good, especially since starting prednisone . She has been eating foods high in sodium recently, especially around the 4th of July holiday. Also states she smokes marijuana frequently to help with pain.   HF Medications: Metoprolol  succinate 25 mg daily Losartan  25 mg daily Spironolactone  25 mg daily Farxiga  10 mg daily Digoxin  0.125 mg daily  Furosemide  40 mg daily   Has the patient been experiencing any side effects to the medications prescribed?  Yes, states she is having dizziness and lightheadedness after taking methotrexate  and it makes her head spin. Has found that taking Tylenol  before methotrexate  helps to prevent this.   Does the patient have any problems obtaining medications due to transportation or finances?  No, has [place] Medicaid.   Understanding of regimen: excellent Understanding of indications: excellent Potential of compliance:  excellent Patient understands to avoid NSAIDs. Patient understands to avoid decongestants.    Pertinent Lab Values: 11/06/2023: Serum creatinine 0.9, BUN 11, Potassium 3.9, Sodium 140 CMET + CBC w/diff, BNP today  pending  Vital Signs: Weight: 181.4 lbs (last clinic weight: 177 lbs) Blood pressure: 112/76 mmHg  Heart rate: 91 BPM   Assessment/Plan: 1. Chronic systolic heart failure: Suspected due to cardiac sarcoid. - PET sarcoid positive - Decrease prednisone  to 20 mg daily per protocol - Increase methotrexate  to 15 mg weekly per protocol. She will take tylenol  prior to dose to prevent dizziness. - Continue folic acid  1 mg daily, Bactrim  MWF, calcium  and Vitamin D  - Continue furosemide  40 mg daily. She feels like she has extra fluid today, notes significant neck and back pain and says this is how she knows she has fluid in her lungs. Weight is also up 5 lbs from last visit. Does complain of PND but no LEE on exam. She will take extra furosemide  today to help with fluid. - Continue metoprolol  succinate 25 mg daily - Continue losartan  25 mg daily,   - Continue spironlactone 25 mg daily - Continue Farxiga  10 mg daily  - Continue digoxin  0.125 mg daily  2. HTN -BP 112/76, higher than her usual which she attributes to pain. - Limiting further GDMT titration  Follow up in 1 month with PharmD for to continue adjusting cardiac sarcoidosis medications.   Tinnie Redman, PharmD, BCPS, BCCP, CPP Heart Failure Clinic Pharmacist [phone]

## 2024-01-06 NOTE — Patient Instructions (Signed)
 It was a pleasure seeing you today!  MEDICATIONS: -We are changing your medications today -Decrease prednisone  to 20 mg (2 tablets) daily -Increase methotrexate  to 15 mg (6 tablets) once a week - Take extra Lasix  to help get rid of the extra fluid. Decreasing the prednisone  should also help.  -Call if you have questions about your medications.  LABS: -We will call you if your labs need attention.  NEXT APPOINTMENT: Return to clinic in 1 month with Pharmacy Clinic.  In general, to take care of your heart failure: -Limit your fluid intake to 2 Liters (half-gallon) per day.   -Limit your salt intake to ideally 2-3 grams (2000-3000 mg) per day. -Weigh yourself daily and record, and bring that weight diary to your next appointment.  (Weight gain of 2-3 pounds in 1 day typically means fluid weight.) -The medications for your heart are to help your heart and help you live longer.   -Please contact us  before stopping any of your heart medications.  Call the clinic at [phone] with questions or to reschedule future appointments.

## 2024-01-09 ENCOUNTER — Other Ambulatory Visit: Payer: Self-pay

## 2024-01-09 ENCOUNTER — Other Ambulatory Visit (HOSPITAL_COMMUNITY): Payer: Self-pay

## 2024-01-09 ENCOUNTER — Other Ambulatory Visit: Payer: Self-pay | Admitting: Family Medicine

## 2024-01-09 MED ORDER — MELATONIN 5 MG PO TABS
5.0000 mg | ORAL_TABLET | Freq: Every day | ORAL | 3 refills | Status: AC
  Filled 2024-01-09 – 2024-02-11 (×2): qty 90, 90d supply, fill #0
  Filled 2024-05-04: qty 90, 90d supply, fill #1
  Filled 2024-06-17: qty 90, 90d supply, fill #2

## 2024-01-09 NOTE — Telephone Encounter (Signed)
 Chart reviewed. Rx refilled.

## 2024-01-10 ENCOUNTER — Other Ambulatory Visit (HOSPITAL_COMMUNITY): Payer: Self-pay

## 2024-01-11 DIAGNOSIS — I509 Heart failure, unspecified: Secondary | ICD-10-CM | POA: Diagnosis not present

## 2024-01-11 DIAGNOSIS — R0789 Other chest pain: Secondary | ICD-10-CM | POA: Diagnosis not present

## 2024-01-12 ENCOUNTER — Other Ambulatory Visit (HOSPITAL_COMMUNITY): Payer: Self-pay

## 2024-01-23 ENCOUNTER — Other Ambulatory Visit (HOSPITAL_COMMUNITY): Payer: Self-pay

## 2024-01-23 NOTE — Progress Notes (Incomplete)
 ***In Progress***   Advanced Heart Failure Clinic Note  Referring Physician: Diona Perkins, MD  Primary Care: Diona Perkins, MD AHF Cardiologist: Dr. Zenaida  HPI:  Amber Franco is a 53 y.o. female who presents for follow up of chronic systolic heart failure.  Patient presented with chest pain, orthopnea, and shortness of breath after recent deaths of her husband and father. Symptomatic with echocardiogram with EF of less than 20% with global LV hypokinesis, LV apex swirling and severely reduced RV function as well as moderate MVR. Underwent IV diuresis, GDMT, L/RHC (no ischemic disease), cardiac MR (Mild LV dilatation w/ severe systolic dysfunction (EF 25%), Normal RV w/ moderate systolic dysfunction (EF 37%), multifocal LGE possible sarcoidosis vs DCM vs prior myocarditis (recommended FDG-PET), moderate MR (22%), small pericardial effusion ). She was stable and started on spironolactone , farxiga , losartan , digoxin  and transitioned to oral Lasix .  At last visit patient stated she was doing well overall, though did have some MSK pain, ongoing. Staying active, exertional symptoms were limited but does still have occasional sharp, positional chest pain. MD discussed her recent diagnosis of cardiac sarcoid and what treatment entails. Also had a long discussion about potential ICD therapy. She was very concerned about any sort of procedures and would like to avoid at this time. Acknowledged that she is relatively high risk but planned for repeat echo and if reduced at that time would proceed.   Returned to HF clinic for pharmacist medication titration and adjustment of immunosuppression per sarcoid protocol. At previous visit with MD she was started on prednisone  30 mg daily, methotrexate  10 mg once weekly (takes Mondays), folic acid  1 mg daily, Bactrim  DS 1 tablet every Monday/Wednesday/Friday, calcium  600 mg twice daily, and vitamin D  1000 units daily. Overall stated she was in a lot of pain due to muscle  spasms ongoing for the past 2 days. Endorsed some dizziness and lightheadedness that lasts for ~45-60 minutes. She stated this occurs after she takes methotrexate , but has found that taking Tylenol  prior to methotrexate  helps prevent episodes. Endorsed MSK chest pain. Stated her heart was beating in a weird rhythm but not fluttering. Stated this happens if she does not take furosemide . Breathing had been horrible, especially since last Thursday, 01/01/24, stating that she felt like she had fluid on her lungs. She took 3 tablets of furosemide  (120 mg total) on 01/02/24, then 2 tablets (80 mg total) on 01/03/24 then back to 1 tablet daily thereafter. Did not take furosemide  the previous day because in too much pain. Stated her weight the previous night was 178.1 lbs. Endorsed some LEE, but not much. No LEE on exam. Endorsed orthopnea since start of July, since her A/C went out and weather had been hotter. Appetite had been very good, especially since starting prednisone . She had been eating foods high in sodium recently, especially around the 4th of July holiday. Also stated she smokes marijuana frequently to help with pain.   Today he returns to HF clinic for pharmacist medication titration. At last visit with MD ***.   Overall feeling ***. Dizziness, lightheadedness, fatigue:  Chest pain or palpitations:  How is your breathing?: *** SOB: Able to complete all ADLs. Activity level ***  Weight at home pounds. Takes furosemide /torsemide/bumex *** mg *** daily.  LEE PND/Orthopnea  Appetite *** Low-salt diet:   Physical Exam Cost/affordability of meds  - doing about same since methotrexate  increased?> if not, leave at 15 until after PET? -cmet, cbc/diff, bnp - clean up med list -dec  prednisone  to 15 mg daily and inc methotrexate  to 20 mg weekly >>>d/c bactrim  now that pred <20 or leave per protocol until pred at 10 -PET is 8/28, stoner is 9/25 > needs repeat pharm visit to dec pred to 10  HF  Medications: Metoprolol  succinate 25 mg daily Losartan  25 mg daily Spironolactone  25 mg daily Farxiga  10 mg daily Digoxin  0.125 mg daily  Furosemide  40 mg daily   Has the patient been experiencing any side effects to the medications prescribed?  Yes, states she is having dizziness and lightheadedness after taking methotrexate  and it makes her head spin. Has found that taking Tylenol  before methotrexate  helps to prevent this. ***  Does the patient have any problems obtaining medications due to transportation or finances?  No, has [place] Medicaid.   Understanding of regimen: excellent Understanding of indications: excellent Potential of compliance: excellent Patient understands to avoid NSAIDs. Patient understands to avoid decongestants.    Pertinent Lab Values: 11/06/2023: Serum creatinine 0.9, BUN 11, Potassium 3.9, Sodium 140 01/23/24: Serum creatinine 1.07, BUN 31, Potassium 3.6, Sodium 135, HgB 13.4, WBC 23.8 (likely 2/2 prednisone ), PLT 342 CMET + CBC w/diff, BNP today pending ***  Vital Signs: *** Weight: 181.4 lbs (last clinic weight: 177 lbs) Blood pressure: 112/76 mmHg  Heart rate: 91 BPM   Assessment/Plan: 1. Chronic systolic heart failure: Suspected due to cardiac sarcoid. - PET sarcoid positive - Decrease prednisone  to 20 mg daily per protocol *** - Increase methotrexate  to 15 mg weekly per protocol. She will take tylenol  prior to dose to prevent dizziness.*** - Continue folic acid  1 mg daily, Bactrim  MWF, calcium  and Vitamin D  *** - Continue furosemide  40 mg daily. She feels like she has extra fluid today, notes significant neck and back pain and says this is how she knows she has fluid in her lungs. Weight is also up 5 lbs from last visit. Does complain of PND but no LEE on exam. She will take extra furosemide  today to help with fluid. *** - Continue metoprolol  succinate 25 mg daily - Continue losartan  25 mg daily,   - Continue spironlactone 25 mg daily - Continue  Farxiga  10 mg daily  - Continue digoxin  0.125 mg daily  2. HTN *** -BP 112/76, higher than her usual which she attributes to pain. - Limiting further GDMT titration  Follow up in 1 month with PharmD for to continue adjusting immunosuppression medications for cardiac sarcoidosis.   Tinnie Redman, PharmD, BCPS, BCCP, CPP Heart Failure Clinic Pharmacist [phone]

## 2024-01-28 ENCOUNTER — Other Ambulatory Visit: Payer: Self-pay

## 2024-01-28 ENCOUNTER — Other Ambulatory Visit (HOSPITAL_COMMUNITY): Payer: Self-pay

## 2024-02-04 ENCOUNTER — Other Ambulatory Visit (HOSPITAL_COMMUNITY)

## 2024-02-11 ENCOUNTER — Other Ambulatory Visit (HOSPITAL_BASED_OUTPATIENT_CLINIC_OR_DEPARTMENT_OTHER): Payer: Self-pay

## 2024-02-11 ENCOUNTER — Ambulatory Visit (HOSPITAL_COMMUNITY)
Admission: RE | Admit: 2024-02-11 | Discharge: 2024-02-11 | Disposition: A | Discharge status: Home / Self Care | Source: Ambulatory Visit | Attending: Cardiology | Admitting: Cardiology

## 2024-02-11 ENCOUNTER — Other Ambulatory Visit (HOSPITAL_COMMUNITY): Payer: Self-pay

## 2024-02-11 ENCOUNTER — Other Ambulatory Visit: Payer: Self-pay

## 2024-02-11 ENCOUNTER — Encounter (HOSPITAL_COMMUNITY): Payer: Self-pay

## 2024-02-11 DIAGNOSIS — I5022 Chronic systolic (congestive) heart failure: Secondary | ICD-10-CM

## 2024-02-11 MED ORDER — PREDNISONE 10 MG PO TABS
15.0000 mg | ORAL_TABLET | Freq: Every day | ORAL | 2 refills | Status: DC
  Filled 2024-02-11 – 2024-02-20 (×3): qty 45, 30d supply, fill #0
  Filled ????-??-??: fill #0

## 2024-02-11 MED ORDER — METHOTREXATE SODIUM 2.5 MG PO TABS
20.0000 mg | ORAL_TABLET | ORAL | 5 refills | Status: DC
  Filled 2024-02-11 (×2): qty 32, 28d supply, fill #0
  Filled 2024-03-08: qty 32, 28d supply, fill #1
  Filled 2024-04-02: qty 32, 28d supply, fill #2
  Filled 2024-05-04: qty 32, 28d supply, fill #3
  Filled 2024-05-31: qty 32, 28d supply, fill #4
  Filled 2024-07-19: qty 32, 28d supply, fill #5

## 2024-02-11 NOTE — Progress Notes (Signed)
 Advanced Heart Failure Clinic Note  Referring Physician: Diona Perkins, MD  Primary Care: Diona Perkins, MD AHF Cardiologist: Dr. Zenaida  HPI:  Amber Franco is a 53 y.o. female who presents for follow up of chronic systolic heart failure.  Patient presented with chest pain, orthopnea, and shortness of breath after recent deaths of her husband and father. Symptomatic with echocardiogram with EF of less than 20% with global LV hypokinesis, LV apex swirling and severely reduced RV function as well as moderate MVR. Underwent IV diuresis, GDMT, L/RHC (no ischemic disease), cardiac MR (Mild LV dilatation w/ severe systolic dysfunction (EF 25%), Normal RV w/ moderate systolic dysfunction (EF 37%), multifocal LGE possible sarcoidosis vs DCM vs prior myocarditis (recommended FDG-PET), moderate MR (22%), small pericardial effusion ). She was stable and started on spironolactone , farxiga , losartan , digoxin  and transitioned to oral Lasix .  At last visit patient stated she was doing well overall, though did have some MSK pain, ongoing. Staying active, exertional symptoms were limited but does still have occasional sharp, positional chest pain. MD discussed her recent diagnosis of cardiac sarcoid and what treatment entails. Also had a long discussion about potential ICD therapy. She was very concerned about any sort of procedures and would like to avoid at this time. Acknowledged that she is relatively high risk but planned for repeat echo and if reduced at that time would proceed.   Returned to HF clinic for pharmacist medication titration and adjustment of immunosuppression per sarcoid protocol. At previous visit with MD she was started on prednisone  30 mg daily, methotrexate  10 mg once weekly (takes Mondays), folic acid  1 mg daily, Bactrim  DS 1 tablet every Monday/Wednesday/Friday, calcium  600 mg twice daily, and vitamin D  1000 units daily. Overall stated she was in a lot of pain due to muscle spasms ongoing for the  past 2 days. Endorsed some dizziness and lightheadedness that lasts for ~45-60 minutes. She stated this occurs after she takes methotrexate , but has found that taking Tylenol  prior to methotrexate  helps prevent episodes. Endorsed MSK chest pain. Stated her heart was beating in a weird rhythm but not fluttering. Stated this happens if she does not take furosemide . Breathing had been horrible, especially since last Thursday, 01/01/24, stating that she felt like she had fluid on her lungs. She took 3 tablets of furosemide  (120 mg total) on 01/02/24, then 2 tablets (80 mg total) on 01/03/24 then back to 1 tablet daily thereafter. Did not take furosemide  the previous day because in too much pain. Stated her weight the previous night was 178.1 lbs. Endorsed some LEE, but not much. No LEE on exam. Endorsed orthopnea since start of July, since her A/C went out and weather had been hotter. Appetite had been very good, especially since starting prednisone . She had been eating foods high in sodium recently, especially around the 4th of July holiday. Also stated she smokes marijuana frequently to help with pain.   Visit today for adjustment of immunosuppression per sarcoid protocol. She missed her visit earlier this month, so telephone visit completed today and she will get sarcoid labs on Monday of next week. Overall she is feeling well. Only concern is some weight gain, notes she is up 4-5 lbs. Weight has not been improving with her taking Lasix  60 mg x3 days. Also notes appetite is greatly increased and she is snacking a lot. Weight gain likely caloric and related to prednisone  dose, but unable to check volume status given that this is a telephone visit. She is tolerating  her sarcoid medications well. Taking prednisone  20 mg daily and methotrexate  15 mg weekly as prescribed. She does take methotrexate  throughout the day, rather than all at once, to help with tolerability. Asked about vaccines, she is up to date other than  will need her annual flu shot this year.    HF Medications: Metoprolol  succinate 25 mg daily Losartan  25 mg daily Spironolactone  25 mg daily Farxiga  10 mg daily Digoxin  0.125 mg daily  Furosemide  40 mg daily   Has the patient been experiencing any side effects to the medications prescribed?  Yes, states she is having dizziness and lightheadedness after taking methotrexate  and it makes her head spin. Has found that taking Tylenol  before methotrexate  and spreading it out over the course of the day helps.   Does the patient have any problems obtaining medications due to transportation or finances?  No, has [place] Medicaid.   Understanding of regimen: excellent Understanding of indications: excellent Potential of compliance: excellent Patient understands to avoid NSAIDs. Patient understands to avoid decongestants.    Pertinent Lab Values: 11/06/2023: Serum creatinine 0.9, BUN 11, Potassium 3.9, Sodium 140 01/23/24: Serum creatinine 1.07, BUN 31, Potassium 3.6, Sodium 135, HgB 13.4, WBC 23.8 (likely 2/2 prednisone ), PLT 342 CMET + CBC w/diff, BNP scheduled for 02/16/24  Vital Signs: Unable to obtain, telephone visit.  Assessment/Plan: 1. Chronic systolic heart failure: Suspected due to cardiac sarcoid. - PET sarcoid positive - Decrease prednisone  to 15 mg daily per protocol  - Increase methotrexate  to 20 mg weekly per protocol. She will take tylenol  prior to dose to prevent dizziness. - Will go ahead and discontinue Bactrim  as prednisone  dose is <20 mg daily.  - Continue folic acid  1 mg daily, calcium  and Vitamin D  and pantoprazole  - Continue furosemide  40 mg daily.  - Continue metoprolol  succinate 25 mg daily - Continue losartan  25 mg daily,   - Continue spironlactone 25 mg daily - Continue Farxiga  10 mg daily  - Continue digoxin  0.125 mg daily   Follow up with repeat cardiac PET scheduled for 02/26/24, then see Dr. Zenaida as scheduled on 03/22/24 with echo. SABRA Tinnie Redman,  PharmD, BCPS, BCCP, CPP Heart Failure Clinic Pharmacist [phone]

## 2024-02-11 NOTE — Patient Instructions (Signed)
 It was a pleasure seeing you today!  MEDICATIONS: -We are changing your medications today -Decrease prednisone  to 15 mg daily -Increase methotrexate  to 20 mg (8 tablets) weekly - Stop sulfamethoxazole /trimethoprim  -Call if you have questions about your medications.  LABS: -Please get your labs drawn at lab visit 02/16/24  NEXT APPOINTMENT: Return to clinic in 1 months with Dr. Zenaida.  In general, to take care of your heart failure: -Limit your fluid intake to 2 Liters (half-gallon) per day.   -Limit your salt intake to ideally 2-3 grams (2000-3000 mg) per day. -Weigh yourself daily and record, and bring that weight diary to your next appointment.  (Weight gain of 2-3 pounds in 1 day typically means fluid weight.) -The medications for your heart are to help your heart and help you live longer.   -Please contact us  before stopping any of your heart medications.  Call the clinic at [phone] with questions or to reschedule future appointments.

## 2024-02-16 ENCOUNTER — Ambulatory Visit (HOSPITAL_COMMUNITY)
Admission: RE | Admit: 2024-02-16 | Discharge: 2024-02-16 | Disposition: A | Discharge status: Home / Self Care | Source: Ambulatory Visit | Attending: Cardiology | Admitting: Cardiology

## 2024-02-16 DIAGNOSIS — I5022 Chronic systolic (congestive) heart failure: Secondary | ICD-10-CM | POA: Insufficient documentation

## 2024-02-16 LAB — CBC WITH DIFFERENTIAL/PLATELET
Abs Immature Granulocytes: 0.72 K/uL — ABNORMAL HIGH (ref 0.00–0.07)
Basophils Absolute: 0.1 K/uL (ref 0.0–0.1)
Basophils Relative: 1 %
Eosinophils Absolute: 0.1 K/uL (ref 0.0–0.5)
Eosinophils Relative: 0 %
HCT: 40.3 % (ref 36.0–46.0)
Hemoglobin: 13.3 g/dL (ref 12.0–15.0)
Immature Granulocytes: 4 %
Lymphocytes Relative: 36 %
Lymphs Abs: 6.3 K/uL — ABNORMAL HIGH (ref 0.7–4.0)
MCH: 30.3 pg (ref 26.0–34.0)
MCHC: 33 g/dL (ref 30.0–36.0)
MCV: 91.8 fL (ref 80.0–100.0)
Monocytes Absolute: 0.8 K/uL (ref 0.1–1.0)
Monocytes Relative: 5 %
Neutro Abs: 9.2 K/uL — ABNORMAL HIGH (ref 1.7–7.7)
Neutrophils Relative %: 54 %
Platelets: 290 K/uL (ref 150–400)
RBC: 4.39 MIL/uL (ref 3.87–5.11)
RDW: 12.4 % (ref 11.5–15.5)
WBC: 17.2 K/uL — ABNORMAL HIGH (ref 4.0–10.5)
nRBC: 0 % (ref 0.0–0.2)

## 2024-02-16 LAB — COMPREHENSIVE METABOLIC PANEL WITH GFR
ALT: 17 U/L (ref 0–44)
AST: 16 U/L (ref 15–41)
Albumin: 3.7 g/dL (ref 3.5–5.0)
Alkaline Phosphatase: 40 U/L (ref 38–126)
Anion gap: 9 (ref 5–15)
BUN: 16 mg/dL (ref 6–20)
CO2: 30 mmol/L (ref 22–32)
Calcium: 9.9 mg/dL (ref 8.9–10.3)
Chloride: 98 mmol/L (ref 98–111)
Creatinine, Ser: 1.13 mg/dL — ABNORMAL HIGH (ref 0.44–1.00)
GFR, Estimated: 58 mL/min — ABNORMAL LOW (ref >60)
Glucose, Bld: 108 mg/dL — ABNORMAL HIGH (ref 70–99)
Potassium: 4.6 mmol/L (ref 3.5–5.1)
Sodium: 137 mmol/L (ref 135–145)
Total Bilirubin: 1 mg/dL (ref 0.0–1.2)
Total Protein: 6.9 g/dL (ref 6.5–8.1)

## 2024-02-16 LAB — BRAIN NATRIURETIC PEPTIDE: B Natriuretic Peptide: 7.7 pg/mL (ref 0.0–100.0)

## 2024-02-19 ENCOUNTER — Encounter: Payer: Self-pay | Admitting: Family Medicine

## 2024-02-19 ENCOUNTER — Encounter (HOSPITAL_COMMUNITY): Payer: Self-pay

## 2024-02-19 ENCOUNTER — Ambulatory Visit (HOSPITAL_COMMUNITY): Payer: Self-pay | Admitting: Cardiology

## 2024-02-20 ENCOUNTER — Other Ambulatory Visit (HOSPITAL_COMMUNITY): Payer: Self-pay

## 2024-02-24 ENCOUNTER — Telehealth (HOSPITAL_COMMUNITY): Payer: Self-pay | Admitting: *Deleted

## 2024-02-24 ENCOUNTER — Ambulatory Visit: Admitting: Student

## 2024-02-24 NOTE — Telephone Encounter (Signed)
 Reaching out to patient to offer assistance regarding upcoming cardiac imaging study; pt verbalizes understanding of appt date/time, parking situation and where to check in, pre-test NPO status; name and call back number provided for further questions should they arise  Larey Brick RN Navigator Cardiac Imaging [facility] [phone] office [phone] cell  Patient verbalized understanding of diet prep.

## 2024-02-26 ENCOUNTER — Encounter (HOSPITAL_COMMUNITY)
Admission: RE | Admit: 2024-02-26 | Discharge: 2024-02-26 | Disposition: A | Discharge status: Home / Self Care | Source: Ambulatory Visit | Attending: Cardiology | Admitting: Cardiology

## 2024-02-26 DIAGNOSIS — I5022 Chronic systolic (congestive) heart failure: Secondary | ICD-10-CM | POA: Insufficient documentation

## 2024-02-26 DIAGNOSIS — R931 Abnormal findings on diagnostic imaging of heart and coronary circulation: Secondary | ICD-10-CM | POA: Insufficient documentation

## 2024-02-26 LAB — NM PET CT MYOCARDIAL SARCOIDOSIS
LV dias vol: 106 mL (ref 46–106)
Nuc Stress EF: 31 %
Rest Nuclear Isotope Dose: 21.1 mCi

## 2024-02-26 MED ORDER — FLUDEOXYGLUCOSE F - 18 (FDG) INJECTION
8.9000 | Freq: Once | INTRAVENOUS | Status: AC | PRN
  Administered 2024-02-26: 8.97 via INTRAVENOUS

## 2024-02-26 MED ORDER — RUBIDIUM RB82 GENERATOR (RUBYFILL)
21.9000 | PACK | Freq: Once | INTRAVENOUS | Status: AC
  Administered 2024-02-26: 21.19 via INTRAVENOUS

## 2024-02-28 ENCOUNTER — Other Ambulatory Visit (HOSPITAL_COMMUNITY): Payer: Self-pay

## 2024-03-02 ENCOUNTER — Other Ambulatory Visit (HOSPITAL_COMMUNITY): Payer: Self-pay

## 2024-03-08 ENCOUNTER — Other Ambulatory Visit (HOSPITAL_COMMUNITY): Payer: Self-pay

## 2024-03-08 ENCOUNTER — Other Ambulatory Visit: Payer: Self-pay

## 2024-03-12 ENCOUNTER — Encounter: Payer: Self-pay | Admitting: Family Medicine

## 2024-03-12 ENCOUNTER — Other Ambulatory Visit (HOSPITAL_COMMUNITY): Payer: Self-pay

## 2024-03-12 ENCOUNTER — Ambulatory Visit (INDEPENDENT_AMBULATORY_CARE_PROVIDER_SITE_OTHER): Admitting: Family Medicine

## 2024-03-12 ENCOUNTER — Other Ambulatory Visit: Payer: Self-pay

## 2024-03-12 VITALS — BP 113/88 | HR 92 | Ht 63.0 in | Wt 190.5 lb

## 2024-03-12 DIAGNOSIS — G8929 Other chronic pain: Secondary | ICD-10-CM | POA: Diagnosis not present

## 2024-03-12 DIAGNOSIS — S00521A Blister (nonthermal) of lip, initial encounter: Secondary | ICD-10-CM

## 2024-03-12 DIAGNOSIS — M545 Low back pain, unspecified: Secondary | ICD-10-CM

## 2024-03-12 DIAGNOSIS — Z23 Encounter for immunization: Secondary | ICD-10-CM | POA: Diagnosis not present

## 2024-03-12 DIAGNOSIS — M549 Dorsalgia, unspecified: Secondary | ICD-10-CM | POA: Diagnosis not present

## 2024-03-12 DIAGNOSIS — M722 Plantar fascial fibromatosis: Secondary | ICD-10-CM

## 2024-03-12 MED ORDER — BACLOFEN 5 MG PO TABS
5.0000 mg | ORAL_TABLET | Freq: Three times a day (TID) | ORAL | 1 refills | Status: DC | PRN
  Filled 2024-03-12: qty 30, 10d supply, fill #0

## 2024-03-12 NOTE — Progress Notes (Incomplete)
    SUBJECTIVE:   CHIEF COMPLAINT / HPI:   Back pain  -upper and lower back pain and spasms ongoing since aug 6th  -has been taking 7 g of tyelnol per day  -no radiation elsewhere -no recent fall or injury   Feet pain -ongoing couple of weeks -plantar surface of foot, heel to ball of feet  -worse with standing  Mucosal blisters -ongoing couple of weeks -blister of lips, gums beneath dentures -using hot sauce every day recently  -not vesicular in form, no blisters elsewhere   PERTINENT  PMH / PSH: CHF, possible sarcoidosis   OBJECTIVE:   BP 113/88   Pulse 92   Ht 5' 3 (1.6 m)   Wt 190 lb 8 oz (86.4 kg)   LMP 03/03/2024   SpO2 99%   BMI 33.75 kg/m    General: Well-appearing. Resting comfortably in room. CV: Normal S1/S2. No extra heart sounds. Warm and well-perfused. Pulm: Breathing comfortably on room air. CTAB. No increased WOB. Skin:  Warm, dry. Psych: Pleasant and appropriate.  MSK: Tender to palpation of soft tissue of upper and lower back. No significant bony tenderness. Left upper back with greater protrusion of scapula than right at rest. No tenderness to palpation of bilateral feet.    ASSESSMENT/PLAN:   Assessment & Plan Encounter for immunization  Upper back pain Chronic bilateral low back pain without sciatica  Plantar fasciitis  Blister of lip      Damien Cassis, MD [facility]

## 2024-03-12 NOTE — Progress Notes (Signed)
    SUBJECTIVE:   CHIEF COMPLAINT / HPI:   Back pain  -upper and lower back pain and spasms ongoing since aug 6th  -has been taking 7 g of tyelnol per day  -no radiation elsewhere -no recent fall or injury   Feet pain -ongoing couple of weeks -plantar surface of foot, heel to ball of feet  -worse with standing  Mucosal blisters -ongoing couple of weeks -blister of lips, gums beneath dentures -using hot sauce every day recently  -not vesicular in form, no blisters elsewhere   PERTINENT  PMH / PSH: CHF, possible sarcoidosis   OBJECTIVE:   BP 113/88   Pulse 92   Ht 5' 3 (1.6 m)   Wt 190 lb 8 oz (86.4 kg)   LMP 03/03/2024   SpO2 99%   BMI 33.75 kg/m    General: Well-appearing. Resting comfortably in room. Mouth: Shallow ulcer of R upper lip. No thrush or active bleeding.  CV: Normal S1/S2. No extra heart sounds. Warm and well-perfused. Pulm: Breathing comfortably on room air. CTAB. No increased WOB. Skin:  Warm, dry. Psych: Pleasant and appropriate.  MSK: Tender to palpation of soft tissue of upper and lower back. No significant bony tenderness. Left upper back with greater protrusion of scapula than right at rest. No tenderness to palpation of bilateral feet.    ASSESSMENT/PLAN:   Assessment & Plan Upper back pain Chronic bilateral low back pain without sciatica Most consistent with soft tissue etiology. Low concern for acute bony change. Exam suggestive of possible scapular asymmetry. L scapula XR ordered. CMP ordered given recent excess tylenol  use. Plantar fasciitis Presentation most consistent with plantar fasciitis. Discussed supportive care measures at home including frozen water bottle, tennis ball, stretching, etc.  Blister of lip Concern for mucosal injury 2/2 new frequent hot sauce use. Discussed avoiding spicy foods for next week to monitor for improvement.  Encounter for immunization Received annual flu shot today.    RTC in 1 month if symptoms not  improving.   Damien Cassis, MD [facility]

## 2024-03-12 NOTE — Patient Instructions (Addendum)
 Thank you for visiting clinic today and allowing us  to participate in your care!  For your back pain, we are getting imaging done of your upper back/shoulder region. Please call [phone] to schedule this. We prescribed a muscle relaxer (baclofen ) for you to use as needed. Please only use Tylenol  1000 mg up to 4 times per day.   Please avoid spicy foods for 1 week. If you lip/mouth symptoms do not improve, please let us  know.   For your feet, please try freezing a plastic water bottle and rolling it under your foot. Continue stretching regularly. You can try using a tennis ball to roll under your feet as well.   Please schedule an appointment in 1 month if you symptoms are not improving.   Reach out any time with any questions or concerns you may have - we are here for you!  Damien Cassis, MD [facility] [phone]

## 2024-03-13 LAB — COMPREHENSIVE METABOLIC PANEL WITH GFR
ALT: 21 IU/L (ref 0–32)
AST: 12 IU/L (ref 0–40)
Albumin: 4.4 g/dL (ref 3.8–4.9)
Alkaline Phosphatase: 53 IU/L (ref 44–121)
BUN/Creatinine Ratio: 20 (ref 9–23)
BUN: 24 mg/dL (ref 6–24)
Bilirubin Total: 0.4 mg/dL (ref 0.0–1.2)
CO2: 24 mmol/L (ref 20–29)
Calcium: 10.5 mg/dL — ABNORMAL HIGH (ref 8.7–10.2)
Chloride: 99 mmol/L (ref 96–106)
Creatinine, Ser: 1.21 mg/dL — ABNORMAL HIGH (ref 0.57–1.00)
Globulin, Total: 2.6 g/dL (ref 1.5–4.5)
Glucose: 87 mg/dL (ref 70–99)
Potassium: 4.2 mmol/L (ref 3.5–5.2)
Sodium: 142 mmol/L (ref 134–144)
Total Protein: 7 g/dL (ref 6.0–8.5)
eGFR: 54 mL/min/1.73 — ABNORMAL LOW (ref >59)

## 2024-03-15 ENCOUNTER — Ambulatory Visit: Payer: Self-pay | Admitting: Family Medicine

## 2024-03-19 ENCOUNTER — Telehealth (HOSPITAL_COMMUNITY): Payer: Self-pay | Admitting: Cardiology

## 2024-03-19 NOTE — Telephone Encounter (Signed)
 Called to confirm/remind patient of their appointment at the Advanced Heart Failure Clinic on 03/19/2024.   Appointment:   [] Confirmed  [x] Left mess   [] No answer/No voice mail  [] VM Full/unable to leave message  [] Phone not in service  Patient reminded to bring all medications and/or complete list.  Confirmed patient has transportation. Gave directions, instructed to utilize valet parking.

## 2024-03-22 ENCOUNTER — Other Ambulatory Visit (HOSPITAL_COMMUNITY): Payer: Self-pay

## 2024-03-22 ENCOUNTER — Other Ambulatory Visit: Payer: Self-pay

## 2024-03-22 ENCOUNTER — Ambulatory Visit (HOSPITAL_COMMUNITY)
Admission: RE | Admit: 2024-03-22 | Discharge: 2024-03-22 | Disposition: A | Discharge status: Home / Self Care | Source: Ambulatory Visit | Attending: Cardiology | Admitting: Cardiology

## 2024-03-22 ENCOUNTER — Ambulatory Visit (HOSPITAL_COMMUNITY): Payer: Self-pay | Admitting: Cardiology

## 2024-03-22 ENCOUNTER — Encounter (HOSPITAL_COMMUNITY): Payer: Self-pay | Admitting: Cardiology

## 2024-03-22 ENCOUNTER — Inpatient Hospital Stay (HOSPITAL_COMMUNITY)
Admission: RE | Admit: 2024-03-22 | Discharge: 2024-03-22 | Disposition: A | Discharge status: Home / Self Care | Source: Ambulatory Visit | Attending: Cardiology | Admitting: Cardiology

## 2024-03-22 ENCOUNTER — Other Ambulatory Visit (HOSPITAL_COMMUNITY): Payer: Self-pay | Admitting: Cardiology

## 2024-03-22 VITALS — BP 104/70 | HR 100 | Wt 190.0 lb

## 2024-03-22 DIAGNOSIS — I472 Ventricular tachycardia, unspecified: Secondary | ICD-10-CM

## 2024-03-22 DIAGNOSIS — I110 Hypertensive heart disease with heart failure: Secondary | ICD-10-CM | POA: Diagnosis not present

## 2024-03-22 DIAGNOSIS — I5022 Chronic systolic (congestive) heart failure: Secondary | ICD-10-CM | POA: Diagnosis not present

## 2024-03-22 LAB — ECHOCARDIOGRAM COMPLETE
Area-P 1/2: 6.54 cm2
Calc EF: 39.3 %
S' Lateral: 3.3 cm
Single Plane A2C EF: 37.9 %
Single Plane A4C EF: 39.1 %

## 2024-03-22 MED ORDER — PREDNISONE 5 MG PO TABS
5.0000 mg | ORAL_TABLET | Freq: Every day | ORAL | 3 refills | Status: DC
  Filled 2024-03-22: qty 30, 30d supply, fill #0
  Filled 2024-05-04 (×2): qty 30, 30d supply, fill #1

## 2024-03-22 NOTE — Patient Instructions (Addendum)
 DECREASE Prednisone  to 5 mg daily  Your provider has recommended that  you wear a Zio Patch for 14 days.  This monitor will record your heart rhythm for our review.  IF you have any symptoms while wearing the monitor please press the button.  If you have any issues with the patch or you notice a red or orange light on it please call the company at [phone].  Once you remove the patch please mail it back to the company as soon as possible so we can get the results.   Cardiac Sarcoidosis/Inflammation PET Scan Patient Instructions  Please report to Radiology at the [facility] Main Entrance 15 minutes early for your test.  [address] [place], [place] [address]  BRING FOOD DIARY WITH YOU TO THIS APPOINTMENT For 24 hours before the test: Do not exercise! Do not eat after 5 pm the day before your test! To make sure that your scanning results are accurate, you MUST follow the sarcoid prep meal diet starting the day before your PET scan. This diet involves eating no carbohydrates 24 hours before the test.  You will keep a log of all that you eat the day before your test. If you have questions or do not understand this diet, please call [phone] for more information. If you are unable to follow this diet, please discuss an alternative strategy with the coordinator.  If you are diabetic, continue your diabetes medications as usual on the day before until you begin to fast. NO DIABETES MEDICATIONS ONCE YOU BEGIN TO FAST. On the morning of your test, please wait until after your test to take your morning medications.  What foods can I eat the day before my test?  Drink only water or black coffee (WITHOUT sugar, artificial sweetener, cream, or milk). Eggs (prepared without milk or cheese)  Meat that is either broiled or pan fried in butter WITHOUT breading (chicken, [place], bacon, meat-only sausage, hamburger, steak, fish) Butter, salt & pepper What foods must I AVOID the day  before my test?  Do not consume alcoholic beverages, sodas, fruit juice, coffee creamer, or sports drinks  Do not eat vegetables, beans, nuts, fruits, juices, bread, grains, rice, pasta, potatoes, or any baked goods Do not eat dairy products (milk, cheese, etc.)  Do not eat mayonnaise, ketchup, tartar sauce, mustard, or other condiments Do not add sugar, artificial sweeteners, or Splenda (sucralose) to foods or drinks  Do not eat breaded foods (like fried chicken)  Do not eat sweets, candy, gum, sweetened cough drops, lozenges, or sugar  Do not eat sweetened, grilled, or cured meats or meat with carbohydrate-containing additives (some sausages, ham, sweetened bacon)  Suggested items for breakfast, lunch, or dinner:  Breakfast  3 to 5 fatty sausage links fried in butter. 3 to 5 bacon strips.  3 eggs pan fried in butter (no milk or cheese).  Lunch/Dinner  2 hamburger patties fried in butter. Chicken or fatty fish pan fried in butter. No breading. 8 oz. fatty steak pan fried in butter.  Beverages  Drink only water or black coffee. DO NOT ADD SUGAR, ARTIFICIAL SWEETENER, CREAM, OR MILK  Please call [facility] to cancel or reschedule your appointment at [phone] For more information and frequently asked questions, please visit our website : [email or web address]   Cardiac Sarcoidosis/Inflammation PET Scan  Food Diary Name: _____________________________ Please fill in EXACTLY what you have eaten and when for 24 hours PRIOR to your test date.  Time  Food/Drink Comments  Breakfast                Lunch                Dinner                Snacks                 DO NOT EXERCISE THE DAY BEFORE YOUR TEST DO NOT EAT AFTER 5 PM THE DAY BEFORE YOUR TEST.  ON THE DAY OF YOUR TEST, DO NOT EAT ANY FOOD AND ONLY DRINK CLEAR WATER! PLEASE BRING THIS FOOD DIARY WITH YOU TO YOUR APPOINTMENT  Please follow up with our heart failure pharmacist in 1  month.  Your physician recommends that you schedule a follow-up appointment in: 2 months.  If you have any questions or concerns before your next appointment please send us  a message through [place] or call our office at [phone].    TO LEAVE A MESSAGE FOR THE NURSE SELECT OPTION 2, PLEASE LEAVE A MESSAGE INCLUDING: YOUR NAME DATE OF BIRTH CALL BACK NUMBER REASON FOR CALL**this is important as we prioritize the call backs  YOU WILL RECEIVE A CALL BACK THE SAME DAY AS LONG AS YOU CALL BEFORE 4:00 PM  At the [facility], you and your health needs are our priority. As part of our continuing mission to provide you with exceptional heart care, we have created designated Provider Care Teams. These Care Teams include your primary Cardiologist (physician) and Advanced Practice Providers (APPs- Physician Assistants and Nurse Practitioners) who all work together to provide you with the care you need, when you need it.   You may see any of the following providers on your designated Care Team at your next follow up: Dr Toribio Fuel Dr Ezra Shuck Dr. Ria Commander Dr. Morene Brownie Amy Lenetta, NP Caffie Shed, [place] [facility] [place], [place] Beckey Coe, NP [place] Lee, NP Ellouise Class, NP Tinnie Redman, PharmD Jaun Bash, PharmD   Please be sure to bring in all your medications bottles to every appointment.    Thank you for choosing [facility]

## 2024-03-22 NOTE — Progress Notes (Signed)
 Zio patch placed onto patient.  All instructions and information reviewed with patient, they verbalize understanding with no questions.

## 2024-03-25 ENCOUNTER — Other Ambulatory Visit (HOSPITAL_COMMUNITY): Payer: Self-pay

## 2024-03-27 NOTE — Progress Notes (Signed)
 ADVANCED HEART FAILURE FOLLOW UP CLINIC NOTE  Referring Physician: Diona Perkins, MD  Primary Care: Diona Perkins, MD Primary Cardiologist:  HPI: Mayrani Khamis is a 53 y.o. female who presents for follow up of chronic systolic heart failure.      Patient presented with chest pain, orthopnea, and shortness of breath after recent deaths of her husband and father. Symptomatic with echocardiogram with EF of less than 20% with global LV hypokinesis, LV apex swirling and severely reduced RV function as well as moderate MVR. Underwent IV diuresis, GDMT, L/RHC (no ischemic disease), cardiac MR (Mild LV dilatation w/ severe systolic dysfunction (EF 25%), Normal RV w/ moderate systolic dysfunction (EF 37%), multifocal LGE possible sarcoidosis vs DCM vs prior myocarditis (recommended FDG-PET), moderate MR (22%), small pericardial effusion ). She was stable and started on spironolactone , farxiga , losartan , digoxin  and transitioned to oral lasix .     SUBJECTIVE:  Patient reports that overall she has done well.  She has had difficulty with taking her prednisone , noting that when she takes 15 mg she feels ill and has had to decrease her dose to 10.  She has had no issues taking the methotrexate  and is currently taking the 20 mg weekly without issue.  She denies any significant palpitations, shortness of breath, lower extremity swelling.  Blood pressure remains borderline.  Discussed her recent echocardiogram showing mildly improved ejection fraction.  PMH, current medications, allergies, social history, and family history reviewed in epic.  PHYSICAL EXAM: Vitals:   03/22/24 1143  BP: 104/70  Pulse: 100  SpO2: 99%    GENERAL: NAD, well appearing PULM:  Normal work of breathing, CTAB CARDIAC:  JVP: flat         Normal rate with regular rhythm. No murmurs, rubs or gallops.  No edema. Warm and well perfused extremities. ABDOMEN: Soft, non-tender, non-distended. NEUROLOGIC: Patient is oriented x3 with no  focal or lateralizing neurologic deficits.     DATA REVIEW  ECG: 07/2023: Sinus tachy, LAE  ECHO: 07/2023: LVEF 20-25%, RV function severely reduced as well 03/22/2024: LVEF 35 to 40%, moderate LVH, grade 2 diastolic dysfunction, normal RV systolic function  CATH: 07/2023: Normal right dominant coronary arteriography, RA 8, PA 48/32 (39), PCWP 25, Fick CO 3.55L/min, Fick CI 1.95L/min/m2  CMR:  cardiac MR (Mild LV dilatation w/ severe systolic dysfunction (EF 25%), Normal RV w/ moderate systolic dysfunction (EF 37%), multifocal LGE possible sarcoidosis vs DCM vs prior myocarditis (recommended FDG-PET), moderate MR (22%), small pericardial effusion ).   PET: 10/22/2023: FDG uptake in the apical to basal anterolateral papillary muscle, concerning for active sarcoid 02/26/24: Follow up PET uninterpretable  ASSESSMENT & PLAN:  Chronic systolic heart failure: Suspected due to cardiac sarcoid, prior PET positive. Interestingly enough no mediastinal lymphadenopathy. Doing well with treatment, already decreased to prednisone  10mg . PET was poor quality. However, given improving EF, NYHA class II symptoms will continue to titrate down prednisone  and repeat PET scan. Would be reasonable to consider biopsy if evidence of FDG on PET.  - PET sarcoid positive - Decrease prednisone  to 10mg  daily. Can continue further titration at follow up with pharmacy - Continue methotrexate  20mg  qweekly - Zio patch, patient would like to avoid ICD if possible, recommened based on current diagonsis - Continue digoxin  0.125mg  daily - Continue farixga 10mg  daily, losartan  12.5mg  daily, spironolactone  25mg  daily, metoprolol  succinate 25mg  daliy - Repeat PET ordered  HTN -BP soft but stable, similar to previous cannot titrate medical therapy further  Chest pain: Persistent, previously  normal cath. - zio patch as above   Morene Brownie, MD Advanced Heart Failure Mechanical Circulatory Support 03/27/24

## 2024-04-02 ENCOUNTER — Other Ambulatory Visit (HOSPITAL_COMMUNITY): Payer: Self-pay

## 2024-04-05 ENCOUNTER — Other Ambulatory Visit (HOSPITAL_COMMUNITY): Payer: Self-pay

## 2024-04-12 ENCOUNTER — Ambulatory Visit (INDEPENDENT_AMBULATORY_CARE_PROVIDER_SITE_OTHER): Admitting: Family Medicine

## 2024-04-12 ENCOUNTER — Encounter: Payer: Self-pay | Admitting: Family Medicine

## 2024-04-12 ENCOUNTER — Other Ambulatory Visit (HOSPITAL_COMMUNITY): Payer: Self-pay

## 2024-04-12 ENCOUNTER — Other Ambulatory Visit: Payer: Self-pay

## 2024-04-12 VITALS — BP 93/67 | HR 81 | Ht 63.0 in | Wt 198.2 lb

## 2024-04-12 DIAGNOSIS — Z1211 Encounter for screening for malignant neoplasm of colon: Secondary | ICD-10-CM | POA: Diagnosis not present

## 2024-04-12 DIAGNOSIS — H6991 Unspecified Eustachian tube disorder, right ear: Secondary | ICD-10-CM | POA: Diagnosis not present

## 2024-04-12 DIAGNOSIS — Z0000 Encounter for general adult medical examination without abnormal findings: Secondary | ICD-10-CM | POA: Diagnosis present

## 2024-04-12 LAB — POCT GLYCOSYLATED HEMOGLOBIN (HGB A1C): Hemoglobin A1C: 6.4 % — AB (ref 4.0–5.6)

## 2024-04-12 MED ORDER — BACLOFEN 5 MG PO TABS
5.0000 mg | ORAL_TABLET | Freq: Three times a day (TID) | ORAL | 3 refills | Status: AC | PRN
  Filled 2024-04-12: qty 30, 10d supply, fill #0
  Filled 2024-05-04: qty 30, 10d supply, fill #1
  Filled 2024-06-15: qty 30, 10d supply, fill #2
  Filled 2024-07-19: qty 30, 10d supply, fill #3

## 2024-04-12 NOTE — Addendum Note (Signed)
 Encounter addended by: Debarah Garrison MATSU, RN on: 04/12/2024 2:32 PM  Actions taken: Imaging Exam ended

## 2024-04-12 NOTE — Progress Notes (Signed)
    SUBJECTIVE:   Chief compliant/HPI: annual examination  Amber Franco is a 53 y.o. who presents today for an annual exam. Reports doing well overall.   Current concern: Ear popping: Recently traveled on airplane and R ear seems like it is clogged and won't pop back normally.   History reviewed.   OBJECTIVE:   BP 93/67   Pulse 81   Ht 5' 3 (1.6 m)   Wt 198 lb 3.2 oz (89.9 kg)   LMP 04/08/2024   SpO2 100%   BMI 35.11 kg/m   General: Well-appearing. Resting comfortably in room. HEENT: Non-erythematous, non-bulging TM bilaterally. Normal ear canals bilaterally. No postauricular changes.   CV: Normal S1/S2. No extra heart sounds. Warm and well-perfused. Pulm: Breathing comfortably on room air. CTAB. No increased WOB. Abd: Soft, non-tender, non-distended. Skin:  Warm, dry.  Psych: Pleasant and appropriate.    ASSESSMENT/PLAN:   Assessment & Plan Eustachian tube dysfunction, right Reassuring ear exam today. Suspect eustachian dysfunction 2/2 recent rtavel and barometric changes. Supportive care at home discussed and provided in AVS.    Annual Examination  Flowsheet Row Office Visit from 04/12/2024 in [facility]  PHQ-9 Total Score 3  PHQ9 score with low concern.  BP reviewed and at goal.  Reports safety in relationships.  Advance directives previously completed.   Considered the following items based upon USPSTF recommendations: Diabetes screening: ordered HIV testing:recently completed and result reviewed, normal  Hepatitis C: recently completed and result reviewed, normal  Hepatitis B:recently completed and result reviewed, normal  Lipid panel ordered.  Consider repeat every 4-6 years.  Osteoporosis screening considered based upon risk of fracture from [facility] calculator. Major osteoporotic fracture risk is 1.7%. DEXA not ordered.   Cancer Screening Discussion  Cervical cancer screening: prior Pap reviewed,  repeat due in 2030 Breast cancer screening: recently completed and repeat not yet indicated Colorectal cancer screening: discussed, colonoscopy ordered.  Vaccinations: Patient reports previously completing 2-dose series of Shingles vaccine in [place] .   Follow up in 1 year or sooner if indicated.  MyChart Activation: Already signed up  Damien Cassis, MD [facility]

## 2024-04-12 NOTE — Patient Instructions (Addendum)
 Thank you for visiting clinic today and allowing us  to participate in your care!  It is always a joy catching up with you! So glad you are doing well overall. We discussed routine colonoscopy and routine labwork today.   For your ear symptoms, please consider the following: Lifestyle Do not do any of the following until your health care provider approves: Travel to high altitudes. Fly in airplanes. Work in a Estate agent or room. Scuba dive. Do not use any products that contain nicotine or tobacco. These products include cigarettes, chewing tobacco, and vaping devices, such as e-cigarettes. If you need help quitting, ask your health care provider. Keep your ears dry. Wear fitted earplugs during showering and bathing. Dry your ears completely after. General instructions Take over-the-counter and prescription medicines only as told by your health care provider. Use techniques to help pop your ears as recommended by your health care provider. These may include: Chewing gum. Yawning. Frequent, forceful swallowing. Closing your mouth, holding your nose closed, and gently blowing as if you are trying to blow air out of your nose.  Please schedule an appointment as needed.   Reach out any time with any questions or concerns you may have - we are here for you!  Damien Cassis, MD [facility] [phone]   Your Cardiology Appt Info: October 22 at 11:00 AM [facility]  Address: [address], [place], [place] [address] Phone: [phone]

## 2024-04-13 LAB — LIPID PANEL
Chol/HDL Ratio: 5.4 ratio — ABNORMAL HIGH (ref 0.0–4.4)
Cholesterol, Total: 227 mg/dL — ABNORMAL HIGH (ref 100–199)
HDL: 42 mg/dL
LDL Chol Calc (NIH): 146 mg/dL — ABNORMAL HIGH (ref 0–99)
Triglycerides: 213 mg/dL — ABNORMAL HIGH (ref 0–149)
VLDL Cholesterol Cal: 39 mg/dL (ref 5–40)

## 2024-04-13 LAB — BASIC METABOLIC PANEL WITH GFR
BUN/Creatinine Ratio: 10 (ref 9–23)
BUN: 9 mg/dL (ref 6–24)
CO2: 22 mmol/L (ref 20–29)
Calcium: 9.9 mg/dL (ref 8.7–10.2)
Chloride: 103 mmol/L (ref 96–106)
Creatinine, Ser: 0.93 mg/dL (ref 0.57–1.00)
Glucose: 118 mg/dL — ABNORMAL HIGH (ref 70–99)
Potassium: 4.3 mmol/L (ref 3.5–5.2)
Sodium: 140 mmol/L (ref 134–144)
eGFR: 73 mL/min/1.73 (ref >59)

## 2024-04-14 ENCOUNTER — Ambulatory Visit: Payer: Self-pay | Admitting: Family Medicine

## 2024-04-20 NOTE — Progress Notes (Signed)
 Advanced Heart Failure Clinic Note  Referring Physician: Diona Perkins, MD  Primary Care: Diona Perkins, MD AHF Cardiologist: Dr. Zenaida  HPI:  Amber Franco is a 53 y.o. female who presents for follow up of chronic systolic heart failure.   Patient presented with chest pain, orthopnea, and shortness of breath after recent deaths of her husband and father. Symptomatic with echocardiogram with EF of less than 20% with global LV hypokinesis, LV apex swirling and severely reduced RV function as well as moderate MVR. Underwent IV diuresis, GDMT, L/RHC (no ischemic disease), cardiac MR (Mild LV dilatation w/ severe systolic dysfunction (EF 25%), Normal RV w/ moderate systolic dysfunction (EF 37%), multifocal LGE possible sarcoidosis vs DCM vs prior myocarditis (recommended FDG-PET), moderate MR (22%), small pericardial effusion. She was stable and started on spironolactone , farxiga , losartan , digoxin  and transitioned to oral lasix .    Pharmacy clinic visit on 02/11/24 for adjustment of immunosuppression per sarcoid protocol. She missed her visit earlier in the August, so a telephone visit was completed with follow up labs the next week. Overall she was feeling well. Only concern was some weight gain, noted she was up 4-5 lbs. Weight had not been improving with her taking Lasix  60 mg x3 days. Also noted appetite was greatly increased and she was snacking a lot. Weight gain likely caloric and related to prednisone  dose, but unable to check volume status given that this it was a telephone visit. She was tolerating her sarcoid medications well. Taking prednisone  20 mg daily and methotrexate  15 mg weekly as prescribed. She takes methotrexate  throughout the day, rather than all at once, to help with tolerability. Asked about vaccines, she reported being up to date other than needing her annual flu shot. Per protocol, prednisone  was decreased and methotrexate  was increased. Bactrim  was discontinued.  At last visit  with MD on 03/22/24, patient reported overall she was doing well.  She had difficulty with taking her prednisone , noting that when she took 15 mg she felt ill so she decreased her dose to 10 mg.  She had no issues taking the methotrexate  and was adherent to 20 mg weekly. She denied any significant palpitations, shortness of breath, lower extremity swelling.  Blood pressure remained borderline. Discussed her recent echocardiogram showing mildly improved ejection fraction and prednisone  was decreased.  Today she returns to HF clinic for pharmacist medication titration. At last visit with MD, prednisone  was decreased to 5 mg daily. Repeat PET was ordered and Zio patch was placed. Overall she reports feeling okay since last visit. Reports she has recently become sexually active and is now having sexual intercourse ~6 days per week. After sexual intercourse, she feels lightheaded and dizzy. Otherwise, she denies lightheadedness/dizziness. Endorses a sharp pain in her chest yesterday morning after getting out of the shower. Reported feeling like the pain was moving around in her chest and she also had SOB. Reports she sat down to catch her breath and after about 15-30 minutes her symptoms resolved. She also endorses an episode of palpitations last week where she felt like her heart was skipping a beat. Stated that she was laying down at the time and it looked liked she was asleep. She says that she normally snores, but at the time she was not snoring so her uncle became concerned and came to check on her. Otherwise she denies any SOB and has been active going for walks and hikes. Endorses increased appetite with prednisone , but has been trying to control her portion  sizes because she knows she is gaining weight. Denies LEE at home and no LEE on exam. Reports BP at home has been 100s/70s.  HF Medications: Metoprolol  succinate 25 mg daily Losartan  25 mg daily Spironolactone  25 mg daily Farxiga  10 mg daily Digoxin   0.125 mg daily  Furosemide  40 mg daily - taking 40 mg twice daily  Has the patient been experiencing any side effects to the medications prescribed?  No  Does the patient have any problems obtaining medications due to transportation or finances?   No, has [place] Medicaid.   Understanding of regimen: good Understanding of indications: good Potential of compliance: good Patient understands to avoid NSAIDs. Patient understands to avoid decongestants.    Pertinent Lab Values: 04/12/24: Serum creatinine 0.93, BUN 9, Potassium 4.3, Sodium 140 02/16/24: Hgb 13.3, WBC 17.2, Plt 290, BNP 7.7  Vital Signs: Weight: 198.2 lbs (last clinic weight: 189.6 lbs) Blood pressure: 104/70  Heart rate: 93   Assessment/Plan: Chronic systolic heart failure: Suspected due to cardiac sarcoid, prior PET positive. Interestingly enough no mediastinal lymphadenopathy. Doing well with treatment, already decreased to prednisone  5 mg. PET was poor quality. However, given improving EF, NYHA class II symptoms have titrated down prednisone . Discussed the patient's symptoms with Dr. Zenaida today who recommended to continue prednisone  5 mg daily. Will repeat PET scan. Would be reasonable to consider biopsy if evidence of FDG on PET.  - PET sarcoid positive - Continue prednisone  5 mg daily - Continue methotrexate  20mg  qweekly - Repeat CBC/CMP due in Nov/Dec - Zio patch ordered at last visit with no concerns per Dr. Zenaida, patient would like to avoid ICD if possible, recommened based on current diagonsis - Continue digoxin  0.125mg  daily - Continue farixga 10 mg daily, losartan  25 mg daily, spironolactone  25 mg daily, metoprolol  succinate 25 mg daliy - Repeat PET already ordered   HTN -BP soft but stable, similar to previous cannot titrate medical therapy further   Chest pain: Persistent, previously normal cath. - Per Dr. Zenaida, zio patch ordered at last visit with no concerns  Follow up with Dr. Zenaida in 1  month  Izetta Henry, PharmD Clinical Pharmacist

## 2024-04-21 ENCOUNTER — Ambulatory Visit (HOSPITAL_COMMUNITY)
Admission: RE | Admit: 2024-04-21 | Discharge: 2024-04-21 | Disposition: A | Discharge status: Home / Self Care | Source: Ambulatory Visit | Attending: Cardiology | Admitting: Cardiology

## 2024-04-21 VITALS — BP 104/70 | HR 93

## 2024-04-21 DIAGNOSIS — I5022 Chronic systolic (congestive) heart failure: Secondary | ICD-10-CM | POA: Diagnosis not present

## 2024-04-21 DIAGNOSIS — D869 Sarcoidosis, unspecified: Secondary | ICD-10-CM | POA: Diagnosis not present

## 2024-04-21 DIAGNOSIS — Z7984 Long term (current) use of oral hypoglycemic drugs: Secondary | ICD-10-CM | POA: Diagnosis not present

## 2024-04-21 DIAGNOSIS — R079 Chest pain, unspecified: Secondary | ICD-10-CM | POA: Diagnosis not present

## 2024-04-21 DIAGNOSIS — I110 Hypertensive heart disease with heart failure: Secondary | ICD-10-CM | POA: Diagnosis not present

## 2024-04-21 DIAGNOSIS — Z79899 Other long term (current) drug therapy: Secondary | ICD-10-CM | POA: Insufficient documentation

## 2024-04-21 NOTE — Patient Instructions (Signed)
 It was a pleasure seeing you today!  MEDICATIONS: -No medication changes today -Call if you have questions about your medications.  LABS: -We will call you if your labs need attention.  NEXT APPOINTMENT: Return to clinic in 1 month with Dr. Zenaida.  In general, to take care of your heart failure: -Limit your fluid intake to 2 Liters (half-gallon) per day.   -Limit your salt intake to ideally 2-3 grams (2000-3000 mg) per day. -Weigh yourself daily and record, and bring that weight diary to your next appointment.  (Weight gain of 2-3 pounds in 1 day typically means fluid weight.) -The medications for your heart are to help your heart and help you live longer.   -Please contact us  before stopping any of your heart medications.  Call the clinic at [phone] with questions or to reschedule future appointments.

## 2024-05-04 ENCOUNTER — Other Ambulatory Visit (HOSPITAL_COMMUNITY): Payer: Self-pay | Admitting: Cardiology

## 2024-05-04 ENCOUNTER — Other Ambulatory Visit: Payer: Self-pay

## 2024-05-04 ENCOUNTER — Other Ambulatory Visit (HOSPITAL_COMMUNITY): Payer: Self-pay

## 2024-05-04 MED ORDER — PREDNISONE 5 MG PO TABS
5.0000 mg | ORAL_TABLET | Freq: Every day | ORAL | 3 refills | Status: DC
  Filled 2024-05-05: qty 30, 30d supply, fill #0

## 2024-05-05 ENCOUNTER — Other Ambulatory Visit: Payer: Self-pay

## 2024-05-05 ENCOUNTER — Other Ambulatory Visit (HOSPITAL_COMMUNITY): Payer: Self-pay

## 2024-05-07 ENCOUNTER — Other Ambulatory Visit (HOSPITAL_COMMUNITY): Payer: Self-pay

## 2024-05-07 ENCOUNTER — Other Ambulatory Visit: Payer: Self-pay

## 2024-05-13 ENCOUNTER — Other Ambulatory Visit (HOSPITAL_COMMUNITY): Payer: Self-pay

## 2024-05-17 ENCOUNTER — Ambulatory Visit (HOSPITAL_COMMUNITY): Payer: Self-pay | Admitting: Cardiology

## 2024-05-17 ENCOUNTER — Other Ambulatory Visit (HOSPITAL_COMMUNITY): Payer: Self-pay

## 2024-05-20 ENCOUNTER — Ambulatory Visit (HOSPITAL_COMMUNITY)
Admission: RE | Admit: 2024-05-20 | Discharge: 2024-05-20 | Disposition: A | Discharge status: Home / Self Care | Source: Ambulatory Visit | Attending: Cardiology | Admitting: Cardiology

## 2024-05-20 VITALS — BP 118/72 | HR 100 | Wt 202.0 lb

## 2024-05-20 DIAGNOSIS — D869 Sarcoidosis, unspecified: Secondary | ICD-10-CM | POA: Insufficient documentation

## 2024-05-20 DIAGNOSIS — I5022 Chronic systolic (congestive) heart failure: Secondary | ICD-10-CM | POA: Diagnosis not present

## 2024-05-20 DIAGNOSIS — Z79899 Other long term (current) drug therapy: Secondary | ICD-10-CM | POA: Insufficient documentation

## 2024-05-20 DIAGNOSIS — D8685 Sarcoid myocarditis: Secondary | ICD-10-CM | POA: Diagnosis not present

## 2024-05-20 DIAGNOSIS — I110 Hypertensive heart disease with heart failure: Secondary | ICD-10-CM | POA: Diagnosis not present

## 2024-05-20 DIAGNOSIS — Z7984 Long term (current) use of oral hypoglycemic drugs: Secondary | ICD-10-CM | POA: Diagnosis not present

## 2024-05-20 NOTE — Patient Instructions (Addendum)
 STOP Prednisone  on 05/31/24.  Your physician recommends that you schedule a follow-up appointment in: 2 months ( January ) ** PLEASE CALL THE OFFICE IN 3 WEEKS TO ARRANGE YOUR FOLLOW UP APPOINTMENT.**  If you have any questions or concerns before your next appointment please send us  a message through [place] or call our office at [phone].    TO LEAVE A MESSAGE FOR THE NURSE SELECT OPTION 2, PLEASE LEAVE A MESSAGE INCLUDING: YOUR NAME DATE OF BIRTH CALL BACK NUMBER REASON FOR CALL**this is important as we prioritize the call backs  YOU WILL RECEIVE A CALL BACK THE SAME DAY AS LONG AS YOU CALL BEFORE 4:00 PM  At the [facility], you and your health needs are our priority. As part of our continuing mission to provide you with exceptional heart care, we have created designated Provider Care Teams. These Care Teams include your primary Cardiologist (physician) and Advanced Practice Providers (APPs- Physician Assistants and Nurse Practitioners) who all work together to provide you with the care you need, when you need it.   You may see any of the following providers on your designated Care Team at your next follow up: Dr Toribio Fuel Dr Ezra Shuck Dr. Morene Brownie Greig Mosses, NP Caffie Shed, [place] [facility] [place], [place] Beckey Coe, NP Jordan Lee, NP Ellouise Class, NP Tinnie Redman, PharmD Jaun Bash, PharmD   Please be sure to bring in all your medications bottles to every appointment.    Thank you for choosing [facility]

## 2024-05-23 NOTE — Progress Notes (Signed)
 ADVANCED HEART FAILURE FOLLOW UP CLINIC NOTE  Referring Physician: Diona Perkins, MD  Primary Care: Diona Perkins, MD Primary Cardiologist:  HPI: Amber Franco is a 53 y.o. female who presents for follow up of chronic systolic heart failure.      Patient presented with chest pain, orthopnea, and shortness of breath after recent deaths of her husband and father. Symptomatic with echocardiogram with EF of less than 20% with global LV hypokinesis, LV apex swirling and severely reduced RV function as well as moderate MVR. Underwent IV diuresis, GDMT, L/RHC (no ischemic disease), cardiac MR (Mild LV dilatation w/ severe systolic dysfunction (EF 25%), Normal RV w/ moderate systolic dysfunction (EF 37%), multifocal LGE possible sarcoidosis vs DCM vs prior myocarditis (recommended FDG-PET), moderate MR (22%), small pericardial effusion ). She was stable and started on spironolactone , farxiga , losartan , digoxin  and transitioned to oral lasix .  PET scan showed evidence of cardiac sarcoid.  She was started on therapy with methotrexate  and prednisone .  Follow-up PET scan on moderate dose prednisone  showed poor prep, but given improvement in ejection fraction and symptoms have continued to decrease prednisone      SUBJECTIVE:  Overall doing well, she is now taking 5 mg of prednisone .  We discussed that we would like to stop the medication now.  She is reluctant due to the fact that she is liking the way she looks and feels but we agreed to stop it on December 1.  Plan for PET scan 12/18 for follow-up off steroids.  She is continue to take her methotrexate  weekly.  Occasionally with left underarm pain, but positional, reproducible.  PMH, current medications, allergies, social history, and family history reviewed in epic.  PHYSICAL EXAM: Vitals:   05/20/24 1046  BP: 118/72  Pulse: 100  SpO2: 97%    GENERAL: NAD, well appearing PULM:  Normal work of breathing, CTAB CARDIAC:  JVP: flat         Normal  rate with regular rhythm. No murmurs, rubs or gallops.  No edema. Warm and well perfused extremities. ABDOMEN: Soft, non-tender, non-distended. NEUROLOGIC: Patient is oriented x3 with no focal or lateralizing neurologic deficits.      DATA REVIEW  ECG: 07/2023: Sinus tachy, LAE  ECHO: 07/2023: LVEF 20-25%, RV function severely reduced as well 03/22/2024: LVEF 35 to 40%, moderate LVH, grade 2 diastolic dysfunction, normal RV systolic function  CATH: 07/2023: Normal right dominant coronary arteriography, RA 8, PA 48/32 (39), PCWP 25, Fick CO 3.55L/min, Fick CI 1.95L/min/m2  CMR:  cardiac MR (Mild LV dilatation w/ severe systolic dysfunction (EF 25%), Normal RV w/ moderate systolic dysfunction (EF 37%), multifocal LGE possible sarcoidosis vs DCM vs prior myocarditis (recommended FDG-PET), moderate MR (22%), small pericardial effusion ).   PET: 10/22/2023: FDG uptake in the apical to basal anterolateral papillary muscle, concerning for active sarcoid 02/26/24: Follow up PET uninterpretable  ASSESSMENT & PLAN:  Chronic systolic heart failure: Suspected due to cardiac sarcoid, prior PET positive. Interestingly given no mediastinal lymphadenopathy.  Ejection fraction has subsequently improved and we will continue weaning steroids to off on 12/1 with repeat PET scan later in the month.  If persistent inflammation, would plan for whole-body PET with potential biopsy of inflammatory tissue to confirm sarcoid - PET sarcoid positive - Decrease prednisone  to off on 12/1 - Continue methotrexate  20mg  qweekly - Zio patch reviewed, occasional second-degree block type I but otherwise unremarkable -Continue digoxin  0.125 mg daily -Continue Farxiga  10 mg daily, losartan  12.5 mg daily, spironolactone  25 mg daily -  Discussed increasing metoprolol  succinate to 50 mg daily, she would prefer to keep it at 25 until her next follow-up - Repeat PET ordered - Previously declined ICD given improving ejection fraction  and patient wishes  HTN -BP soft but stable, similar to previous cannot titrate medical therapy further  Chest pain: Persistent, previously normal cath. - No alarm symptoms   Morene Brownie, MD Advanced Heart Failure Mechanical Circulatory Support 05/23/24

## 2024-05-28 ENCOUNTER — Encounter (HOSPITAL_COMMUNITY): Payer: Self-pay

## 2024-05-31 ENCOUNTER — Other Ambulatory Visit (HOSPITAL_COMMUNITY): Payer: Self-pay

## 2024-06-15 ENCOUNTER — Telehealth (HOSPITAL_COMMUNITY): Payer: Self-pay | Admitting: *Deleted

## 2024-06-15 ENCOUNTER — Other Ambulatory Visit (HOSPITAL_COMMUNITY): Payer: Self-pay

## 2024-06-15 NOTE — Telephone Encounter (Signed)
 Reaching out to patient to offer assistance regarding upcoming cardiac imaging study; pt verbalizes understanding of appt date/time, parking situation and where to check in, pre-test NPO status; name and call back number provided for further questions should they arise  Larey Brick RN Navigator Cardiac Imaging [facility] [phone] office [phone] cell  Patient verbalized understanding of diet prep.

## 2024-06-16 ENCOUNTER — Encounter (HOSPITAL_COMMUNITY): Payer: Self-pay | Admitting: Cardiology

## 2024-06-17 ENCOUNTER — Encounter (HOSPITAL_COMMUNITY): Admission: RE | Admit: 2024-06-17 | Source: Ambulatory Visit

## 2024-06-17 ENCOUNTER — Other Ambulatory Visit (HOSPITAL_COMMUNITY): Payer: Self-pay

## 2024-06-17 DIAGNOSIS — H269 Unspecified cataract: Secondary | ICD-10-CM

## 2024-06-17 HISTORY — DX: Unspecified cataract: H26.9

## 2024-07-06 ENCOUNTER — Other Ambulatory Visit (HOSPITAL_COMMUNITY): Payer: Self-pay

## 2024-07-06 ENCOUNTER — Telehealth (HOSPITAL_COMMUNITY): Payer: Self-pay

## 2024-07-06 NOTE — Telephone Encounter (Signed)
 Insurance will not cover Cardiac PET scan for patient.

## 2024-07-13 ENCOUNTER — Encounter: Payer: Self-pay | Admitting: Internal Medicine

## 2024-07-19 ENCOUNTER — Other Ambulatory Visit: Payer: Self-pay

## 2024-07-19 ENCOUNTER — Telehealth: Payer: Self-pay

## 2024-07-19 NOTE — Telephone Encounter (Signed)
 While preparing patient chart for upcoming colonoscopy, RN observed that patient has an EF of 35-40% per echocardiogram done 03/22/24. RN contacting Norleen Schillings, CRNA regarding appropriate location for patient's colonoscopy procedure.

## 2024-07-20 ENCOUNTER — Ambulatory Visit: Payer: Self-pay

## 2024-07-20 ENCOUNTER — Other Ambulatory Visit: Payer: Self-pay

## 2024-07-20 ENCOUNTER — Ambulatory Visit (AMBULATORY_SURGERY_CENTER)

## 2024-07-20 ENCOUNTER — Other Ambulatory Visit (HOSPITAL_COMMUNITY): Payer: Self-pay

## 2024-07-20 VITALS — Ht 63.0 in | Wt 176.0 lb

## 2024-07-20 DIAGNOSIS — Z1211 Encounter for screening for malignant neoplasm of colon: Secondary | ICD-10-CM

## 2024-07-20 MED ORDER — NA SULFATE-K SULFATE-MG SULF 17.5-3.13-1.6 GM/177ML PO SOLN
1.0000 | Freq: Once | ORAL | 0 refills | Status: AC
  Filled 2024-07-20: qty 354, 1d supply, fill #0

## 2024-07-20 NOTE — Progress Notes (Signed)
 RN confirmed patient name, date of birth, and address RN confirmed date and time of procedure RN reviewed and confirmed allergies  RN reviewed and updated current medications; confirmed preferred pharmacy Pt is not on diet pills nor GLP-1 medications Pt is not on blood thinners RN reviewed medical & surgical hx  Pt denies issues with constipation  Diabetic - no, though takes Farxiga  for HF No A fib or A flutter No cardiac tests are pending  Pt is not on home 02  No issues known with past sedation with any surgeries or procedures Patient denies ever being told they had issues or difficulty with intubation  Patient unaware of any fh of malignant hyperthermia Ambulates independently RN reviewed prep instructions and explained time frames for holding certain medications RN answered patient questions; patient stated understanding Prep instructions sent via My Chart & printed and mailed

## 2024-08-03 ENCOUNTER — Encounter (HOSPITAL_COMMUNITY): Payer: Self-pay

## 2024-08-04 ENCOUNTER — Other Ambulatory Visit (HOSPITAL_COMMUNITY): Payer: Self-pay | Admitting: Cardiology

## 2024-08-04 ENCOUNTER — Other Ambulatory Visit: Payer: Self-pay

## 2024-08-04 ENCOUNTER — Other Ambulatory Visit (HOSPITAL_COMMUNITY): Payer: Self-pay

## 2024-08-04 MED ORDER — SENNA-DOCUSATE SODIUM 8.6-50 MG PO TABS
1.0000 | ORAL_TABLET | ORAL | 2 refills | Status: AC | PRN
  Filled ????-??-??: fill #0

## 2024-08-04 MED ORDER — METHOTREXATE SODIUM 2.5 MG PO TABS
20.0000 mg | ORAL_TABLET | ORAL | 5 refills | Status: AC
  Filled ????-??-??: fill #0

## 2024-08-06 ENCOUNTER — Ambulatory Visit: Admitting: Internal Medicine

## 2024-08-06 ENCOUNTER — Encounter: Payer: Self-pay | Admitting: Internal Medicine

## 2024-08-06 VITALS — BP 126/76 | HR 87 | Temp 97.5°F | Resp 19 | Ht 63.0 in | Wt 176.0 lb

## 2024-08-06 DIAGNOSIS — Z1211 Encounter for screening for malignant neoplasm of colon: Secondary | ICD-10-CM

## 2024-08-06 DIAGNOSIS — D129 Benign neoplasm of anus and anal canal: Secondary | ICD-10-CM

## 2024-08-06 MED ORDER — SODIUM CHLORIDE 0.9 % IV SOLN: 500.0000 mL | INTRAVENOUS | Status: DC

## 2024-08-06 NOTE — Progress Notes (Signed)
 Called to room to assist during endoscopic procedure.  Patient ID and intended procedure confirmed with present staff. Received instructions for my participation in the procedure from the performing physician.

## 2024-08-06 NOTE — Patient Instructions (Signed)
Resume previous diet. Await pathology results.   YOU HAD AN ENDOSCOPIC PROCEDURE TODAY AT THE [facility]:   Refer to the procedure report that was given to you for any specific questions about what was found during the examination.  If the procedure report does not answer your questions, please call your gastroenterologist to clarify.  If you requested that your care partner not be given the details of your procedure findings, then the procedure report has been included in a sealed envelope for you to review at your convenience later.  YOU SHOULD EXPECT: Some feelings of bloating in the abdomen. Passage of more gas than usual.  Walking can help get rid of the air that was put into your GI tract during the procedure and reduce the bloating. If you had a lower endoscopy (such as a colonoscopy or flexible sigmoidoscopy) you may notice spotting of blood in your stool or on the toilet paper. If you underwent a bowel prep for your procedure, you may not have a normal bowel movement for a few days.  Please Note:  You might notice some irritation and congestion in your nose or some drainage.  This is from the oxygen used during your procedure.  There is no need for concern and it should clear up in a day or so.  SYMPTOMS TO REPORT IMMEDIATELY:  Following lower endoscopy (colonoscopy or flexible sigmoidoscopy):  Excessive amounts of blood in the stool  Significant tenderness or worsening of abdominal pains  Swelling of the abdomen that is new, acute  Fever of 100F or higher  For urgent or emergent issues, a gastroenterologist can be reached at any hour by calling [phone]. Do not use MyChart messaging for urgent concerns.    DIET:  We do recommend a small meal at first, but then you may proceed to your regular diet.  Drink plenty of fluids but you should avoid alcoholic beverages for 24 hours.  ACTIVITY:  You should plan to take it easy for the rest of today and you should NOT  DRIVE or use heavy machinery until tomorrow (because of the sedation medicines used during the test).    FOLLOW UP: Our staff will call the number listed on your records the next business day following your procedure.  We will call around 7:15- 8:00 am to check on you and address any questions or concerns that you may have regarding the information given to you following your procedure. If we do not reach you, we will leave a message.     If any biopsies were taken you will be contacted by phone or by letter within the next 1-3 weeks.  Please call us at [phone] if you have not heard about the biopsies in 3 weeks.    SIGNATURES/CONFIDENTIALITY: You and/or your care partner have signed paperwork which will be entered into your electronic medical record.  These signatures attest to the fact that that the information above on your After Visit Summary has been reviewed and is understood.  Full responsibility of the confidentiality of this discharge information lies with you and/or your care-partner.

## 2024-08-06 NOTE — Op Note (Signed)
 [facility] Patient Name: Amber Franco Procedure Date: 08/06/2024 10:00 AM MRN: [id number] Endoscopist: Rosario Estefana Kidney , , [id number] Age: 54 Referring MD:  Date of Birth: 1971/01/04 Gender: Female Account #: [id number] Procedure:                Colonoscopy Indications:              Screening for colorectal malignant neoplasm, This                            is the patient's first colonoscopy Medicines:                Monitored Anesthesia Care Procedure:                Pre-Anesthesia Assessment:                           - Prior to the procedure, a History and Physical                            was performed, and patient medications and                            allergies were reviewed. The patient's tolerance of                            previous anesthesia was also reviewed. The risks                            and benefits of the procedure and the sedation                            options and risks were discussed with the patient.                            All questions were answered, and informed consent                            was obtained. Prior Anticoagulants: The patient has                            taken no anticoagulant or antiplatelet agents. ASA                            Grade Assessment: III - A patient with severe                            systemic disease. After reviewing the risks and                            benefits, the patient was deemed in satisfactory                            condition to undergo the procedure.  After obtaining informed consent, the colonoscope                            was passed under direct vision. Throughout the                            procedure, the patient's blood pressure, pulse, and                            oxygen saturations were monitored continuously. The                            Olympus CF-HQ190L ([id number]) Colonoscope was                            introduced through the  anus and advanced to the the                            terminal ileum. The colonoscopy was performed                            without difficulty. The patient tolerated the                            procedure well. The quality of the bowel                            preparation was excellent. The terminal ileum,                            ileocecal valve, appendiceal orifice, and rectum                            were photographed. Scope In: 10:15:49 AM Scope Out: 10:29:12 AM Scope Withdrawal Time: 0 hours 10 minutes 3 seconds  Total Procedure Duration: 0 hours 13 minutes 23 seconds  Findings:                 The terminal ileum appeared normal.                           A 3 mm polyp was found in the rectum. The polyp was                            sessile. The polyp was removed with a cold snare.                            Resection and retrieval were complete.                           Non-bleeding internal hemorrhoids were found during                            retroflexion. Complications:            No  immediate complications. Estimated Blood Loss:     Estimated blood loss was minimal. Impression:               - The examined portion of the ileum was normal.                           - One 3 mm polyp in the rectum, removed with a cold                            snare. Resected and retrieved.                           - Non-bleeding internal hemorrhoids. Recommendation:           - Discharge patient to home (with escort).                           - Await pathology results.                           - The findings and recommendations were discussed                            with the patient. Dr Estefana Federico Rosario Estefana Federico,  08/06/2024 10:30:54 AM

## 2024-08-06 NOTE — Progress Notes (Signed)
 "   GASTROENTEROLOGY PROCEDURE H&P NOTE   Primary Care Physician: Diona Perkins, MD    Reason for Procedure:   Colon cancer screening  Plan:    Colonoscopy  Patient is appropriate for endoscopic procedure(s) in the ambulatory ([facility]) setting.  The nature of the procedure, as well as the risks, benefits, and alternatives were carefully and thoroughly reviewed with the patient. Ample time for discussion and questions allowed. The patient understood, was satisfied, and agreed to proceed.     HPI: Amber Franco is a 54 y.o. female who presents for colonoscopy for colon cancer screening. Denies blood in stools, changes in bowel habits, or unintentional weight loss. Denies family history of colon cancer. This is her first colonoscopy.  Past Medical History:  Diagnosis Date   Acute exacerbation of CHF (congestive heart failure) (HCC) 08/09/2023   Allergy    Cataract 06/17/2024   Hypertension    Sickle cell trait    Stroke [facility])     Past Surgical History:  Procedure Laterality Date   RIGHT HEART CATH AND CORONARY ANGIOGRAPHY N/A 07/08/2023   Procedure: RIGHT HEART CATH AND CORONARY ANGIOGRAPHY;  Surgeon: Zenaida Morene PARAS, MD;  Location: [facility] INVASIVE CV LAB;  Service: Cardiovascular;  Laterality: N/A;   TUBAL LIGATION      Prior to Admission medications  Medication Sig Start Date End Date Taking? Authorizing Provider  acetaminophen  (TYLENOL ) 325 MG tablet Take 2 tablets (650 mg total) by mouth every 6 (six) hours as needed for mild pain (pain score 1-3) or moderate pain (pain score 4-6). 07/11/23   Howell Lunger, DO  Baclofen  5 MG TABS Take 1 tablet (5 mg total) by mouth every 8 (eight) hours as needed. Patient not taking: Reported on 07/20/2024 04/12/24   Diona Perkins, MD  calcium  carbonate (OSCAL) 1500 (600 Ca) MG TABS tablet Take 1 tablet (1,500 mg total) by mouth 2 (two) times daily with a meal. 12/05/23   Zenaida Morene PARAS, MD  cetirizine (ZYRTEC) 10 MG chewable tablet Chew 10 mg by  mouth as needed for allergies.    [provider]  Cholecalciferol (VITAMIN D -3) 25 MCG (1000 UT) CAPS Take 1 capsule by mouth 2 (two) times daily.    [provider]  dapagliflozin  propanediol (FARXIGA ) 10 MG TABS tablet Take 1 tablet (10 mg total) by mouth daily. 12/08/23     diclofenac  Sodium (VOLTAREN ) 1 % GEL Apply 4 g topically. 12/07/19   [provider]  digoxin  (LANOXIN ) 0.125 MG tablet Take 1 tablet (0.125 mg total) by mouth daily. 12/05/23     folic acid  (FOLVITE ) 1 MG tablet Take 1 tablet (1 mg total) by mouth daily. 12/05/23   Zenaida Morene PARAS, MD  furosemide  (LASIX ) 40 MG tablet Take 1 tablet (40 mg total) by mouth daily. Patient taking differently: Take 40 mg by mouth 2 (two) times daily. 12/07/23     loratadine  (CLARITIN ) 10 MG tablet Take 1 tablet (10 mg total) by mouth daily. 07/12/23   Howell Lunger, DO  losartan  (COZAAR ) 25 MG tablet Take 1 tablet (25 mg total) by mouth at bedtime. 12/07/23     melatonin 5 MG TABS Take 1 tablet (5 mg total) by mouth at bedtime. 01/09/24   Diona Perkins, MD  methotrexate  (RHEUMATREX) 2.5 MG tablet Take 8 tablets (20 mg total) by mouth once a week. Caution:Chemotherapy. Protect from light. 08/04/24   Zenaida Morene PARAS, MD  metoprolol  succinate (TOPROL -XL) 25 MG 24 hr tablet Take 1 tablet (25 mg total)  by mouth at bedtime. 12/07/23     pantoprazole  (PROTONIX ) 40 MG tablet Take 1 tablet (40 mg total) by mouth daily. 12/25/23   Diona Perkins, MD  sennosides-docusate sodium  (SENOKOT-S) 8.6-50 MG tablet Take 1 tablet by mouth as needed for constipation. 08/04/24   Zenaida Morene PARAS, MD  spironolactone  (ALDACTONE ) 25 MG tablet Take 1 tablet (25 mg total) by mouth daily. 12/07/23       Current Outpatient Medications  Medication Sig Dispense Refill   acetaminophen  (TYLENOL ) 325 MG tablet Take 2 tablets (650 mg total) by mouth every 6 (six) hours as needed for mild pain (pain score 1-3) or moderate pain (pain score 4-6).     Baclofen  5 MG TABS  Take 1 tablet (5 mg total) by mouth every 8 (eight) hours as needed. (Patient not taking: Reported on 07/20/2024) 30 tablet 3   calcium  carbonate (OSCAL) 1500 (600 Ca) MG TABS tablet Take 1 tablet (1,500 mg total) by mouth 2 (two) times daily with a meal.     cetirizine (ZYRTEC) 10 MG chewable tablet Chew 10 mg by mouth as needed for allergies.     Cholecalciferol (VITAMIN D -3) 25 MCG (1000 UT) CAPS Take 1 capsule by mouth 2 (two) times daily.     dapagliflozin  propanediol (FARXIGA ) 10 MG TABS tablet Take 1 tablet (10 mg total) by mouth daily. 90 tablet 3   diclofenac  Sodium (VOLTAREN ) 1 % GEL Apply 4 g topically.     digoxin  (LANOXIN ) 0.125 MG tablet Take 1 tablet (0.125 mg total) by mouth daily. 90 tablet 3   folic acid  (FOLVITE ) 1 MG tablet Take 1 tablet (1 mg total) by mouth daily. 90 tablet 3   furosemide  (LASIX ) 40 MG tablet Take 1 tablet (40 mg total) by mouth daily. (Patient taking differently: Take 40 mg by mouth 2 (two) times daily.) 90 tablet 3   loratadine  (CLARITIN ) 10 MG tablet Take 1 tablet (10 mg total) by mouth daily.     losartan  (COZAAR ) 25 MG tablet Take 1 tablet (25 mg total) by mouth at bedtime. 90 tablet 3   melatonin 5 MG TABS Take 1 tablet (5 mg total) by mouth at bedtime. 90 tablet 3   methotrexate  (RHEUMATREX) 2.5 MG tablet Take 8 tablets (20 mg total) by mouth once a week. Caution:Chemotherapy. Protect from light. 32 tablet 5   metoprolol  succinate (TOPROL -XL) 25 MG 24 hr tablet Take 1 tablet (25 mg total) by mouth at bedtime. 90 tablet 3   pantoprazole  (PROTONIX ) 40 MG tablet Take 1 tablet (40 mg total) by mouth daily. 90 tablet 5   sennosides-docusate sodium  (SENOKOT-S) 8.6-50 MG tablet Take 1 tablet by mouth as needed for constipation. 100 tablet 2   spironolactone  (ALDACTONE ) 25 MG tablet Take 1 tablet (25 mg total) by mouth daily. 90 tablet 3   Current Facility-Administered Medications  Medication Dose Route Frequency Provider Last Rate Last Admin   0.9 %  sodium  chloride infusion  500 mL Intravenous Continuous Federico Rosario BROCKS, MD        Allergies as of 08/06/2024 - Review Complete 08/06/2024  Allergen Reaction Noted   Strawberry (diagnostic) Anaphylaxis 11/26/2016   Tape Other (See Comments) 11/26/2016    Family History  Problem Relation Age of Onset   Cancer Mother    Hypertension Father    Stroke Father    Stomach cancer Paternal Grandmother    Rectal cancer Neg Hx    Esophageal cancer Neg Hx    Colon cancer Neg  Hx    Colon polyps Neg Hx     Social History   Socioeconomic History   Marital status: Single    Spouse name: Not on file   Number of children: Not on file   Years of education: Not on file   Highest education level: Not on file  Occupational History   Not on file  Tobacco Use   Smoking status: Former    Current packs/day: 0.25    Types: Cigarettes   Smokeless tobacco: Never  Vaping Use   Vaping status: Never Used  Substance and Sexual Activity   Alcohol use: No   Drug use: Yes    Types: Marijuana    Comment: gummies when in pain; not daily or weekly   Sexual activity: Not on file  Other Topics Concern   Not on file  Social History Narrative   Not on file   Social Drivers of Health   Tobacco Use: Medium Risk (07/20/2024)   Patient History    Smoking Tobacco Use: Former    Smokeless Tobacco Use: Never    Passive Exposure: Not on file  Financial Resource Strain: Medium Risk (07/17/2023)   Overall Financial Resource Strain (CARDIA)    Difficulty of Paying Living Expenses: Somewhat hard  Food Insecurity: No Food Insecurity (08/09/2023)   Hunger Vital Sign    Worried About Running Out of Food in the Last Year: Never true    Ran Out of Food in the Last Year: Never true  Transportation Needs: Unmet Transportation Needs (08/09/2023)   PRAPARE - Administrator, Civil Service (Medical): Yes    Lack of Transportation (Non-Medical): Yes  Physical Activity: Not on file  Stress: Not on file  Social  Connections: Not on file  Intimate Partner Violence: Not At Risk (01/11/2024)   Received from [facility]   Epic    Within the last year, have you been afraid of your partner or ex-partner?: No    Within the last year, have you been humiliated or emotionally abused in other ways by your partner or ex-partner?: No    Within the last year, have you been kicked, hit, slapped, or otherwise physically hurt by your partner or ex-partner?: No    Within the last year, have you been raped or forced to have any kind of sexual activity by your partner or ex-partner?: No  Depression (PHQ2-9): Low Risk (04/12/2024)   Depression (PHQ2-9)    PHQ-2 Score: 3  Alcohol Screen: Not on file  Housing: High Risk (08/09/2023)   Housing Stability Vital Sign    Unable to Pay for Housing in the Last Year: No    Number of Times Moved in the Last Year: 2    Homeless in the Last Year: No  Utilities: Not At Risk (08/09/2023)   [employer] Utilities    Threatened with loss of utilities: No  Health Literacy: Not on file    Physical Exam: Vital signs in last 24 hours: BP 122/65   Pulse 99   Temp (!) 97.5 F (36.4 C) (Temporal)   Ht 5' 3 (1.6 m)   Wt 176 lb (79.8 kg)   SpO2 100%   BMI 31.18 kg/m  GEN: NAD EYE: Sclerae anicteric ENT: MMM CV: Non-tachycardic Pulm: No increased work of breathing GI: Soft, NT/ND NEURO:  Alert & Oriented   Estefana Kidney, MD [facility]  08/06/2024 9:38 AM  "

## 2024-08-06 NOTE — Progress Notes (Signed)
 Pt's states no medical or surgical changes since previsit or office visit.

## 2024-08-09 ENCOUNTER — Ambulatory Visit: Payer: Self-pay
